# Patient Record
Sex: Male | Born: 1935
Health system: Southern US, Community
[De-identification: ages and names within clinical notes are randomized; demographics above are authoritative.]

## PROBLEM LIST (undated history)

## (undated) DIAGNOSIS — S30861A Insect bite (nonvenomous) of abdominal wall, initial encounter: Secondary | ICD-10-CM

## (undated) DIAGNOSIS — E785 Hyperlipidemia, unspecified: Secondary | ICD-10-CM

## (undated) DIAGNOSIS — E119 Type 2 diabetes mellitus without complications: Secondary | ICD-10-CM

## (undated) DIAGNOSIS — C61 Malignant neoplasm of prostate: Secondary | ICD-10-CM

## (undated) DIAGNOSIS — I779 Disorder of arteries and arterioles, unspecified: Secondary | ICD-10-CM

## (undated) DIAGNOSIS — I499 Cardiac arrhythmia, unspecified: Secondary | ICD-10-CM

## (undated) DIAGNOSIS — R972 Elevated prostate specific antigen [PSA]: Secondary | ICD-10-CM

## (undated) DIAGNOSIS — Z9889 Other specified postprocedural states: Secondary | ICD-10-CM

## (undated) DIAGNOSIS — I2510 Atherosclerotic heart disease of native coronary artery without angina pectoris: Secondary | ICD-10-CM

## (undated) DIAGNOSIS — N529 Male erectile dysfunction, unspecified: Secondary | ICD-10-CM

## (undated) DIAGNOSIS — C801 Malignant (primary) neoplasm, unspecified: Secondary | ICD-10-CM

## (undated) DIAGNOSIS — W57XXXA Bitten or stung by nonvenomous insect and other nonvenomous arthropods, initial encounter: Secondary | ICD-10-CM

## (undated) DIAGNOSIS — B351 Tinea unguium: Secondary | ICD-10-CM

## (undated) DIAGNOSIS — L570 Actinic keratosis: Secondary | ICD-10-CM

## (undated) DIAGNOSIS — I10 Essential (primary) hypertension: Secondary | ICD-10-CM

## (undated) DIAGNOSIS — S4991XA Unspecified injury of right shoulder and upper arm, initial encounter: Secondary | ICD-10-CM

## (undated) HISTORY — DX: Other specified postprocedural states: Z98.890

## (undated) HISTORY — DX: Actinic keratosis: L57.0

## (undated) HISTORY — DX: Cardiac arrhythmia, unspecified: I49.9

## (undated) HISTORY — DX: Atherosclerotic heart disease of native coronary artery without angina pectoris: I25.10

## (undated) HISTORY — DX: Hyperlipidemia, unspecified: E78.5

## (undated) HISTORY — PX: CARDIAC SURGERY: SHX584

## (undated) HISTORY — DX: Insect bite (nonvenomous) of abdominal wall, initial encounter: W57.XXXA

## (undated) HISTORY — DX: Insect bite (nonvenomous) of abdominal wall, initial encounter: S30.861A

## (undated) HISTORY — DX: Tinea unguium: B35.1

## (undated) HISTORY — DX: Type 2 diabetes mellitus without complications: E11.9

## (undated) HISTORY — PX: CATARACT EXTRACTION, BILATERAL: SHX1313

## (undated) HISTORY — DX: Malignant neoplasm of prostate: C61

## (undated) HISTORY — DX: Male erectile dysfunction, unspecified: N52.9

## (undated) HISTORY — DX: Elevated prostate specific antigen (PSA): R97.20

## (undated) HISTORY — DX: Unspecified injury of right shoulder and upper arm, initial encounter: S49.91XA

## (undated) HISTORY — DX: Disorder of arteries and arterioles, unspecified: I77.9

---

## 2002-01-05 HISTORY — PX: CORONARY ARTERY BYPASS GRAFT: SHX141

## 2002-03-29 ENCOUNTER — Encounter: Payer: Self-pay | Admitting: Surgery

## 2002-03-31 ENCOUNTER — Encounter: Payer: Self-pay | Admitting: Surgery

## 2002-03-31 ENCOUNTER — Inpatient Hospital Stay (HOSPITAL_COMMUNITY): Admission: RE | Admit: 2002-03-31 | Discharge: 2002-04-08 | Payer: Self-pay | Admitting: Surgery

## 2002-04-01 ENCOUNTER — Encounter: Payer: Self-pay | Admitting: Surgery

## 2002-04-02 ENCOUNTER — Encounter: Payer: Self-pay | Admitting: Surgery

## 2002-04-25 ENCOUNTER — Encounter: Payer: Self-pay | Admitting: Surgery

## 2002-04-25 ENCOUNTER — Encounter: Admission: RE | Admit: 2002-04-25 | Discharge: 2002-04-25 | Payer: Self-pay | Admitting: Surgery

## 2002-05-02 ENCOUNTER — Encounter: Payer: Self-pay | Admitting: Surgery

## 2002-05-02 ENCOUNTER — Encounter: Admission: RE | Admit: 2002-05-02 | Discharge: 2002-05-02 | Payer: Self-pay | Admitting: Surgery

## 2002-05-08 ENCOUNTER — Encounter (HOSPITAL_COMMUNITY): Admission: RE | Admit: 2002-05-08 | Discharge: 2002-08-06 | Payer: Self-pay | Admitting: Cardiology

## 2002-05-12 ENCOUNTER — Encounter: Admission: RE | Admit: 2002-05-12 | Discharge: 2002-05-12 | Payer: Self-pay | Admitting: Cardiothoracic Surgery

## 2002-05-12 ENCOUNTER — Encounter: Payer: Self-pay | Admitting: Cardiothoracic Surgery

## 2011-01-20 DIAGNOSIS — R209 Unspecified disturbances of skin sensation: Secondary | ICD-10-CM | POA: Diagnosis not present

## 2011-01-20 DIAGNOSIS — I2510 Atherosclerotic heart disease of native coronary artery without angina pectoris: Secondary | ICD-10-CM | POA: Diagnosis not present

## 2011-01-20 DIAGNOSIS — Z79899 Other long term (current) drug therapy: Secondary | ICD-10-CM | POA: Diagnosis not present

## 2011-01-20 DIAGNOSIS — E7800 Pure hypercholesterolemia, unspecified: Secondary | ICD-10-CM | POA: Diagnosis not present

## 2011-01-20 DIAGNOSIS — C61 Malignant neoplasm of prostate: Secondary | ICD-10-CM | POA: Diagnosis not present

## 2011-01-20 DIAGNOSIS — I10 Essential (primary) hypertension: Secondary | ICD-10-CM | POA: Diagnosis not present

## 2011-01-20 DIAGNOSIS — R7309 Other abnormal glucose: Secondary | ICD-10-CM | POA: Diagnosis not present

## 2011-01-20 DIAGNOSIS — Z0000 Encounter for general adult medical examination without abnormal findings: Secondary | ICD-10-CM | POA: Diagnosis not present

## 2011-01-27 DIAGNOSIS — E7800 Pure hypercholesterolemia, unspecified: Secondary | ICD-10-CM | POA: Diagnosis not present

## 2011-01-27 DIAGNOSIS — I2510 Atherosclerotic heart disease of native coronary artery without angina pectoris: Secondary | ICD-10-CM | POA: Diagnosis not present

## 2011-01-27 DIAGNOSIS — I10 Essential (primary) hypertension: Secondary | ICD-10-CM | POA: Diagnosis not present

## 2011-04-02 DIAGNOSIS — C61 Malignant neoplasm of prostate: Secondary | ICD-10-CM | POA: Diagnosis not present

## 2011-04-14 DIAGNOSIS — N401 Enlarged prostate with lower urinary tract symptoms: Secondary | ICD-10-CM | POA: Diagnosis not present

## 2011-04-14 DIAGNOSIS — C61 Malignant neoplasm of prostate: Secondary | ICD-10-CM | POA: Diagnosis not present

## 2011-04-28 DIAGNOSIS — D235 Other benign neoplasm of skin of trunk: Secondary | ICD-10-CM | POA: Diagnosis not present

## 2011-04-28 DIAGNOSIS — L570 Actinic keratosis: Secondary | ICD-10-CM | POA: Diagnosis not present

## 2011-07-10 DIAGNOSIS — L821 Other seborrheic keratosis: Secondary | ICD-10-CM | POA: Diagnosis not present

## 2011-07-10 DIAGNOSIS — D485 Neoplasm of uncertain behavior of skin: Secondary | ICD-10-CM | POA: Diagnosis not present

## 2011-07-30 DIAGNOSIS — W57XXXA Bitten or stung by nonvenomous insect and other nonvenomous arthropods, initial encounter: Secondary | ICD-10-CM | POA: Diagnosis not present

## 2011-07-30 DIAGNOSIS — R209 Unspecified disturbances of skin sensation: Secondary | ICD-10-CM | POA: Diagnosis not present

## 2011-07-30 DIAGNOSIS — Z1331 Encounter for screening for depression: Secondary | ICD-10-CM | POA: Diagnosis not present

## 2011-07-30 DIAGNOSIS — Z79899 Other long term (current) drug therapy: Secondary | ICD-10-CM | POA: Diagnosis not present

## 2011-07-30 DIAGNOSIS — E7800 Pure hypercholesterolemia, unspecified: Secondary | ICD-10-CM | POA: Diagnosis not present

## 2011-07-30 DIAGNOSIS — T148 Other injury of unspecified body region: Secondary | ICD-10-CM | POA: Diagnosis not present

## 2011-07-30 DIAGNOSIS — I2510 Atherosclerotic heart disease of native coronary artery without angina pectoris: Secondary | ICD-10-CM | POA: Diagnosis not present

## 2011-07-30 DIAGNOSIS — I10 Essential (primary) hypertension: Secondary | ICD-10-CM | POA: Diagnosis not present

## 2011-07-30 DIAGNOSIS — Z0000 Encounter for general adult medical examination without abnormal findings: Secondary | ICD-10-CM | POA: Diagnosis not present

## 2011-09-30 DIAGNOSIS — D485 Neoplasm of uncertain behavior of skin: Secondary | ICD-10-CM | POA: Diagnosis not present

## 2011-09-30 DIAGNOSIS — L570 Actinic keratosis: Secondary | ICD-10-CM | POA: Diagnosis not present

## 2011-10-06 DIAGNOSIS — Z1331 Encounter for screening for depression: Secondary | ICD-10-CM | POA: Diagnosis not present

## 2011-10-06 DIAGNOSIS — I10 Essential (primary) hypertension: Secondary | ICD-10-CM | POA: Diagnosis not present

## 2011-10-06 DIAGNOSIS — E7800 Pure hypercholesterolemia, unspecified: Secondary | ICD-10-CM | POA: Diagnosis not present

## 2011-10-06 DIAGNOSIS — I2510 Atherosclerotic heart disease of native coronary artery without angina pectoris: Secondary | ICD-10-CM | POA: Diagnosis not present

## 2011-10-06 DIAGNOSIS — R209 Unspecified disturbances of skin sensation: Secondary | ICD-10-CM | POA: Diagnosis not present

## 2011-10-15 DIAGNOSIS — C61 Malignant neoplasm of prostate: Secondary | ICD-10-CM | POA: Diagnosis not present

## 2011-10-22 DIAGNOSIS — N401 Enlarged prostate with lower urinary tract symptoms: Secondary | ICD-10-CM | POA: Diagnosis not present

## 2011-10-22 DIAGNOSIS — C61 Malignant neoplasm of prostate: Secondary | ICD-10-CM | POA: Diagnosis not present

## 2011-10-27 ENCOUNTER — Other Ambulatory Visit (HOSPITAL_COMMUNITY): Payer: Self-pay | Admitting: Urology

## 2011-10-27 DIAGNOSIS — C61 Malignant neoplasm of prostate: Secondary | ICD-10-CM

## 2011-11-06 ENCOUNTER — Ambulatory Visit (HOSPITAL_COMMUNITY)
Admission: RE | Admit: 2011-11-06 | Discharge: 2011-11-06 | Disposition: A | Discharge status: Home / Self Care | Payer: Medicare Other | Source: Ambulatory Visit | Attending: Urology | Admitting: Urology

## 2011-11-06 ENCOUNTER — Encounter (HOSPITAL_COMMUNITY)
Admission: RE | Admit: 2011-11-06 | Discharge: 2011-11-06 | Disposition: A | Discharge status: Home / Self Care | Payer: Medicare Other | Source: Ambulatory Visit | Attending: Urology | Admitting: Urology

## 2011-11-06 DIAGNOSIS — C61 Malignant neoplasm of prostate: Secondary | ICD-10-CM | POA: Insufficient documentation

## 2011-11-06 MED ORDER — TECHNETIUM TC 99M MEDRONATE IV KIT
24.0000 | PACK | Freq: Once | INTRAVENOUS | Status: AC | PRN
  Administered 2011-11-06: 24 via INTRAVENOUS

## 2011-11-24 DIAGNOSIS — E782 Mixed hyperlipidemia: Secondary | ICD-10-CM | POA: Diagnosis not present

## 2011-12-27 ENCOUNTER — Emergency Department (HOSPITAL_COMMUNITY): Payer: Medicare Other

## 2011-12-27 ENCOUNTER — Encounter (HOSPITAL_COMMUNITY): Payer: Self-pay | Admitting: Emergency Medicine

## 2011-12-27 ENCOUNTER — Emergency Department (HOSPITAL_COMMUNITY)
Admission: EM | Admit: 2011-12-27 | Discharge: 2011-12-27 | Disposition: A | Discharge status: Home / Self Care | Payer: Medicare Other | Attending: Emergency Medicine | Admitting: Emergency Medicine

## 2011-12-27 DIAGNOSIS — I10 Essential (primary) hypertension: Secondary | ICD-10-CM | POA: Insufficient documentation

## 2011-12-27 DIAGNOSIS — R6889 Other general symptoms and signs: Secondary | ICD-10-CM | POA: Insufficient documentation

## 2011-12-27 DIAGNOSIS — R490 Dysphonia: Secondary | ICD-10-CM | POA: Diagnosis not present

## 2011-12-27 DIAGNOSIS — J069 Acute upper respiratory infection, unspecified: Secondary | ICD-10-CM | POA: Diagnosis not present

## 2011-12-27 DIAGNOSIS — R05 Cough: Secondary | ICD-10-CM | POA: Insufficient documentation

## 2011-12-27 DIAGNOSIS — Z8546 Personal history of malignant neoplasm of prostate: Secondary | ICD-10-CM | POA: Diagnosis not present

## 2011-12-27 DIAGNOSIS — R059 Cough, unspecified: Secondary | ICD-10-CM | POA: Insufficient documentation

## 2011-12-27 DIAGNOSIS — B9789 Other viral agents as the cause of diseases classified elsewhere: Secondary | ICD-10-CM

## 2011-12-27 DIAGNOSIS — J989 Respiratory disorder, unspecified: Secondary | ICD-10-CM | POA: Diagnosis not present

## 2011-12-27 HISTORY — DX: Essential (primary) hypertension: I10

## 2011-12-27 HISTORY — DX: Malignant (primary) neoplasm, unspecified: C80.1

## 2011-12-27 NOTE — ED Notes (Signed)
Pt from home c/o toast stuck in throat. Pt reports sore throat and non-productive cough since Friday. Pt denies SOB or CP, fever, N/V. Pt in NAD and A&Ox4

## 2011-12-27 NOTE — ED Provider Notes (Signed)
History     CSN: [id number]  Arrival date & time 12/27/11  4098   First MD Initiated Contact with Patient 12/27/11 1016      Chief Complaint  Patient presents with  . Sore Throat  . food bolus     (Consider location/radiation/quality/duration/timing/severity/associated sxs/prior treatment) HPI Patient complains of sore throat with pain on swallowing and hoarseness for 2 days symptoms accompanied by cough, no fever. Presents today as this morning he was eating a piece of toast and feels that pus is stuck in his throat. He's been able to drink since the event. No treatment prior to coming here no other complaint no other associated symptoms  Past Medical History  Diagnosis Date  . Cancer     prostate   . Hypertension     Past Surgical History  Procedure Date  . Coronary artery bypass graft 2004  . Cardiac surgery     No family history on file.  History  Substance Use Topics  . Smoking status: Never Smoker   . Smokeless tobacco: Not on file  . Alcohol Use: No      Review of Systems  HENT: Positive for sore throat and voice change.   Respiratory: Positive for cough.   All other systems reviewed and are negative.    Allergies  Review of patient's allergies indicates no known allergies.  Home Medications  No current outpatient prescriptions on file.  BP 131/72  Pulse 95  Temp 99.2 F (37.3 C) (Oral)  Resp 20  SpO2 97%  Physical Exam  Nursing note and vitals reviewed. Constitutional: He appears well-developed and well-nourished.  HENT:  Head: Normocephalic and atraumatic.  Right Ear: External ear normal.  Left Ear: External ear normal.  Mouth/Throat: Oropharynx is clear and moist. No oropharyngeal exudate.  Eyes: Conjunctivae normal are normal. Pupils are equal, round, and reactive to light.  Neck: Neck supple. No tracheal deviation present. No thyromegaly present.  Cardiovascular: Normal rate and regular rhythm.   No murmur heard. Pulmonary/Chest:  Effort normal and breath sounds normal.  Abdominal: Soft. Bowel sounds are normal. He exhibits no distension. There is no tenderness.  Musculoskeletal: Normal range of motion. He exhibits no edema and no tenderness.  Lymphadenopathy:    He has no cervical adenopathy.  Neurological: He is alert. Coordination normal.  Skin: Skin is warm and dry. No rash noted.  Psychiatric: He has a normal mood and affect.    ED Course  Procedures (including critical care time)  Labs Reviewed - No data to display No results found.   No diagnosis found.  At 12 noon patient no longer feels foreign body in his throat. He is able to drink several ounces of water without difficulty.  No results found for this or any previous visit. Dg Chest 2 View  12/27/2011  *RADIOLOGY REPORT*  Clinical Data: Cough  CHEST - 2 VIEW  Comparison: None.  Findings: Cardiomediastinal silhouette is unremarkable.  Status post CABG.  Mild degenerative changes thoracic spine.  No acute infiltrate or pleural effusion.  No pulmonary edema.  IMPRESSION: No active disease.  Status post CABG.   Original Report Authenticated By: Natasha Mead, M.D.     Chest x-ray reviewed by me  MDM  Symptoms consistent with viral respiratory illness. Foreign body sensation in throat resolved spontaneously. Spoke with Dr. Dulce Sellar. Plan patient coughs tomorrow to check in . If he has recurrent foreign body sensation in his throat, will receive further diagnostic outpatient evaluation.  Patient is to  take or Advil for aches. See Dr. Jerelyn Scott if cough and sore throat continues in the week Diagnosis #1 viral respiratory illness #2 dysphagia        Doug Sou, MD 12/27/11 1209

## 2012-01-04 DIAGNOSIS — J069 Acute upper respiratory infection, unspecified: Secondary | ICD-10-CM | POA: Diagnosis not present

## 2012-01-27 DIAGNOSIS — E782 Mixed hyperlipidemia: Secondary | ICD-10-CM | POA: Diagnosis not present

## 2012-01-27 DIAGNOSIS — I2510 Atherosclerotic heart disease of native coronary artery without angina pectoris: Secondary | ICD-10-CM | POA: Diagnosis not present

## 2012-01-27 DIAGNOSIS — E119 Type 2 diabetes mellitus without complications: Secondary | ICD-10-CM | POA: Diagnosis not present

## 2012-01-27 DIAGNOSIS — M25569 Pain in unspecified knee: Secondary | ICD-10-CM | POA: Diagnosis not present

## 2012-01-27 DIAGNOSIS — Z79899 Other long term (current) drug therapy: Secondary | ICD-10-CM | POA: Diagnosis not present

## 2012-01-27 DIAGNOSIS — I10 Essential (primary) hypertension: Secondary | ICD-10-CM | POA: Diagnosis not present

## 2012-01-29 DIAGNOSIS — I2510 Atherosclerotic heart disease of native coronary artery without angina pectoris: Secondary | ICD-10-CM | POA: Diagnosis not present

## 2012-01-29 DIAGNOSIS — R0989 Other specified symptoms and signs involving the circulatory and respiratory systems: Secondary | ICD-10-CM | POA: Diagnosis not present

## 2012-01-29 DIAGNOSIS — E7800 Pure hypercholesterolemia, unspecified: Secondary | ICD-10-CM | POA: Diagnosis not present

## 2012-01-29 DIAGNOSIS — I10 Essential (primary) hypertension: Secondary | ICD-10-CM | POA: Diagnosis not present

## 2012-03-24 DIAGNOSIS — Z961 Presence of intraocular lens: Secondary | ICD-10-CM | POA: Diagnosis not present

## 2012-03-30 DIAGNOSIS — D235 Other benign neoplasm of skin of trunk: Secondary | ICD-10-CM | POA: Diagnosis not present

## 2012-03-30 DIAGNOSIS — Z85828 Personal history of other malignant neoplasm of skin: Secondary | ICD-10-CM | POA: Diagnosis not present

## 2012-03-30 DIAGNOSIS — L570 Actinic keratosis: Secondary | ICD-10-CM | POA: Diagnosis not present

## 2012-04-15 DIAGNOSIS — R0989 Other specified symptoms and signs involving the circulatory and respiratory systems: Secondary | ICD-10-CM | POA: Diagnosis not present

## 2012-05-10 DIAGNOSIS — C61 Malignant neoplasm of prostate: Secondary | ICD-10-CM | POA: Diagnosis not present

## 2012-05-13 DIAGNOSIS — N401 Enlarged prostate with lower urinary tract symptoms: Secondary | ICD-10-CM | POA: Diagnosis not present

## 2012-05-13 DIAGNOSIS — C61 Malignant neoplasm of prostate: Secondary | ICD-10-CM | POA: Diagnosis not present

## 2012-08-08 DIAGNOSIS — M25519 Pain in unspecified shoulder: Secondary | ICD-10-CM | POA: Diagnosis not present

## 2012-08-08 DIAGNOSIS — S40019A Contusion of unspecified shoulder, initial encounter: Secondary | ICD-10-CM | POA: Diagnosis not present

## 2012-08-24 DIAGNOSIS — S40019A Contusion of unspecified shoulder, initial encounter: Secondary | ICD-10-CM | POA: Diagnosis not present

## 2012-08-24 DIAGNOSIS — M25519 Pain in unspecified shoulder: Secondary | ICD-10-CM | POA: Diagnosis not present

## 2012-09-16 DIAGNOSIS — R5382 Chronic fatigue, unspecified: Secondary | ICD-10-CM | POA: Diagnosis not present

## 2012-09-16 DIAGNOSIS — Z1331 Encounter for screening for depression: Secondary | ICD-10-CM | POA: Diagnosis not present

## 2012-09-16 DIAGNOSIS — I2510 Atherosclerotic heart disease of native coronary artery without angina pectoris: Secondary | ICD-10-CM | POA: Diagnosis not present

## 2012-09-16 DIAGNOSIS — Z0000 Encounter for general adult medical examination without abnormal findings: Secondary | ICD-10-CM | POA: Diagnosis not present

## 2012-09-16 DIAGNOSIS — M79609 Pain in unspecified limb: Secondary | ICD-10-CM | POA: Diagnosis not present

## 2012-09-16 DIAGNOSIS — I10 Essential (primary) hypertension: Secondary | ICD-10-CM | POA: Diagnosis not present

## 2012-09-16 DIAGNOSIS — H9190 Unspecified hearing loss, unspecified ear: Secondary | ICD-10-CM | POA: Diagnosis not present

## 2012-09-16 DIAGNOSIS — K5900 Constipation, unspecified: Secondary | ICD-10-CM | POA: Diagnosis not present

## 2012-09-16 DIAGNOSIS — E119 Type 2 diabetes mellitus without complications: Secondary | ICD-10-CM | POA: Diagnosis not present

## 2012-09-16 DIAGNOSIS — E7800 Pure hypercholesterolemia, unspecified: Secondary | ICD-10-CM | POA: Diagnosis not present

## 2012-09-16 DIAGNOSIS — E1142 Type 2 diabetes mellitus with diabetic polyneuropathy: Secondary | ICD-10-CM | POA: Diagnosis not present

## 2012-09-22 DIAGNOSIS — E7800 Pure hypercholesterolemia, unspecified: Secondary | ICD-10-CM | POA: Diagnosis not present

## 2012-09-22 DIAGNOSIS — R5381 Other malaise: Secondary | ICD-10-CM | POA: Diagnosis not present

## 2012-09-22 DIAGNOSIS — I2510 Atherosclerotic heart disease of native coronary artery without angina pectoris: Secondary | ICD-10-CM | POA: Diagnosis not present

## 2012-09-22 DIAGNOSIS — I10 Essential (primary) hypertension: Secondary | ICD-10-CM | POA: Diagnosis not present

## 2012-09-29 DIAGNOSIS — I10 Essential (primary) hypertension: Secondary | ICD-10-CM | POA: Diagnosis not present

## 2012-09-29 DIAGNOSIS — E7800 Pure hypercholesterolemia, unspecified: Secondary | ICD-10-CM | POA: Diagnosis not present

## 2012-09-29 DIAGNOSIS — I2510 Atherosclerotic heart disease of native coronary artery without angina pectoris: Secondary | ICD-10-CM | POA: Diagnosis not present

## 2012-09-29 DIAGNOSIS — R5381 Other malaise: Secondary | ICD-10-CM | POA: Diagnosis not present

## 2012-10-14 DIAGNOSIS — Z23 Encounter for immunization: Secondary | ICD-10-CM | POA: Diagnosis not present

## 2012-10-31 DIAGNOSIS — E7800 Pure hypercholesterolemia, unspecified: Secondary | ICD-10-CM | POA: Diagnosis not present

## 2012-10-31 DIAGNOSIS — M67919 Unspecified disorder of synovium and tendon, unspecified shoulder: Secondary | ICD-10-CM | POA: Diagnosis not present

## 2012-10-31 DIAGNOSIS — K5900 Constipation, unspecified: Secondary | ICD-10-CM | POA: Diagnosis not present

## 2012-10-31 DIAGNOSIS — R5381 Other malaise: Secondary | ICD-10-CM | POA: Diagnosis not present

## 2012-10-31 DIAGNOSIS — I2510 Atherosclerotic heart disease of native coronary artery without angina pectoris: Secondary | ICD-10-CM | POA: Diagnosis not present

## 2012-10-31 DIAGNOSIS — I10 Essential (primary) hypertension: Secondary | ICD-10-CM | POA: Diagnosis not present

## 2012-11-14 ENCOUNTER — Encounter: Payer: Self-pay | Admitting: Interventional Cardiology

## 2012-11-16 DIAGNOSIS — L570 Actinic keratosis: Secondary | ICD-10-CM | POA: Diagnosis not present

## 2012-11-16 DIAGNOSIS — D239 Other benign neoplasm of skin, unspecified: Secondary | ICD-10-CM | POA: Diagnosis not present

## 2012-12-09 DIAGNOSIS — C61 Malignant neoplasm of prostate: Secondary | ICD-10-CM | POA: Diagnosis not present

## 2012-12-12 DIAGNOSIS — N949 Unspecified condition associated with female genital organs and menstrual cycle: Secondary | ICD-10-CM | POA: Diagnosis not present

## 2012-12-16 DIAGNOSIS — C61 Malignant neoplasm of prostate: Secondary | ICD-10-CM | POA: Diagnosis not present

## 2012-12-23 DIAGNOSIS — I10 Essential (primary) hypertension: Secondary | ICD-10-CM | POA: Diagnosis not present

## 2012-12-23 DIAGNOSIS — R5381 Other malaise: Secondary | ICD-10-CM | POA: Diagnosis not present

## 2012-12-23 DIAGNOSIS — I2510 Atherosclerotic heart disease of native coronary artery without angina pectoris: Secondary | ICD-10-CM | POA: Diagnosis not present

## 2012-12-23 DIAGNOSIS — M67919 Unspecified disorder of synovium and tendon, unspecified shoulder: Secondary | ICD-10-CM | POA: Diagnosis not present

## 2012-12-23 DIAGNOSIS — K5900 Constipation, unspecified: Secondary | ICD-10-CM | POA: Diagnosis not present

## 2012-12-23 DIAGNOSIS — E7800 Pure hypercholesterolemia, unspecified: Secondary | ICD-10-CM | POA: Diagnosis not present

## 2013-01-16 DIAGNOSIS — R195 Other fecal abnormalities: Secondary | ICD-10-CM | POA: Diagnosis not present

## 2013-01-16 DIAGNOSIS — J40 Bronchitis, not specified as acute or chronic: Secondary | ICD-10-CM | POA: Diagnosis not present

## 2013-01-23 ENCOUNTER — Ambulatory Visit
Admission: RE | Admit: 2013-01-23 | Discharge: 2013-01-23 | Disposition: A | Discharge status: Home / Self Care | Payer: Medicare Other | Source: Ambulatory Visit | Attending: Internal Medicine | Admitting: Internal Medicine

## 2013-01-23 ENCOUNTER — Other Ambulatory Visit: Payer: Self-pay | Admitting: Internal Medicine

## 2013-01-23 DIAGNOSIS — R062 Wheezing: Secondary | ICD-10-CM | POA: Diagnosis not present

## 2013-01-23 DIAGNOSIS — R05 Cough: Secondary | ICD-10-CM | POA: Diagnosis not present

## 2013-01-23 DIAGNOSIS — R195 Other fecal abnormalities: Secondary | ICD-10-CM | POA: Diagnosis not present

## 2013-01-23 DIAGNOSIS — R059 Cough, unspecified: Secondary | ICD-10-CM

## 2013-01-23 DIAGNOSIS — J9801 Acute bronchospasm: Secondary | ICD-10-CM | POA: Diagnosis not present

## 2013-01-23 DIAGNOSIS — J40 Bronchitis, not specified as acute or chronic: Secondary | ICD-10-CM | POA: Diagnosis not present

## 2013-01-24 DIAGNOSIS — M25519 Pain in unspecified shoulder: Secondary | ICD-10-CM | POA: Diagnosis not present

## 2013-01-24 DIAGNOSIS — S40019A Contusion of unspecified shoulder, initial encounter: Secondary | ICD-10-CM | POA: Diagnosis not present

## 2013-01-26 DIAGNOSIS — M25519 Pain in unspecified shoulder: Secondary | ICD-10-CM | POA: Diagnosis not present

## 2013-02-01 ENCOUNTER — Ambulatory Visit: Payer: Medicare Other | Admitting: Interventional Cardiology

## 2013-02-01 DIAGNOSIS — M25519 Pain in unspecified shoulder: Secondary | ICD-10-CM | POA: Diagnosis not present

## 2013-02-15 ENCOUNTER — Ambulatory Visit (INDEPENDENT_AMBULATORY_CARE_PROVIDER_SITE_OTHER): Payer: Medicare Other | Admitting: Interventional Cardiology

## 2013-02-15 ENCOUNTER — Encounter: Payer: Self-pay | Admitting: Cardiology

## 2013-02-15 ENCOUNTER — Encounter: Payer: Self-pay | Admitting: Interventional Cardiology

## 2013-02-15 VITALS — BP 116/68 | HR 93 | Wt 185.0 lb

## 2013-02-15 DIAGNOSIS — R5381 Other malaise: Secondary | ICD-10-CM

## 2013-02-15 DIAGNOSIS — R5383 Other fatigue: Secondary | ICD-10-CM | POA: Insufficient documentation

## 2013-02-15 DIAGNOSIS — I2510 Atherosclerotic heart disease of native coronary artery without angina pectoris: Secondary | ICD-10-CM

## 2013-02-15 DIAGNOSIS — I10 Essential (primary) hypertension: Secondary | ICD-10-CM

## 2013-02-15 DIAGNOSIS — E7800 Pure hypercholesterolemia, unspecified: Secondary | ICD-10-CM | POA: Diagnosis not present

## 2013-02-15 NOTE — Progress Notes (Signed)
Patient ID: Johnny Fox, male   DOB: 02-16-1928, 80 y.o.   MRN: [id number]    [address], [place], [place]  [address] Phone: [phone] Fax:  [phone]  Date:  02/15/2013   ID:  Johnny Fox, DOB 1928-03-10, MRN [id number]  PCP:  No primary provider on file.      History of Present Illness: Johnny Fox is a 80 y.o. male who has had CABG in 2004. HE carries NTG but he has never used it. Lipids were well controlled. He used to play golf and walk the course. Now he rides, but walks more in the neighborhood. He walks in the park and does yardwork. Prior to CABG, he was not having many symptoms. He had a routine ECG and stress test showing CAD. He had only some DOE. Walking 4 days / week, when the weather allows. He goes to a fitness center on [address]. Double vision at times. Leg pain, R> L, more in the calf. It only hurts while lying in bed. Not associated with walking. He has had some numbness in his feet. CAD/ASCVD:  c/o Dizziness lasting for seconds, unchanged, while getting up from sitting position.  Denies : Dyspnea on exertion.  Fatigue.  Leg edema.  Nitroglycerin.  Orthopnea.  Palpitations.  Paroxysmal nocturnal dyspnea.  Syncope.     Wt Readings from Last 3 Encounters:  02/15/13 185 lb (83.915 kg)     Past Medical History  Diagnosis Date  . Cancer     prostate   . Hypertension   . Coronary artery disease   . Dyslipidemia   . Diabetes mellitus without complication     diet controlled  . Actinic keratoses     and seborrheic keratoses  . ED (erectile dysfunction)   . Onychomycosis   . Arrhythmia     afib postoperatively after CABG in 2004, No recurrence  . Elevated PSA     2010, workup with Johnny Fox  . H/O prostate biopsy   . Prostate cancer     PSA's run approx 7- 2011-2013  . Right shoulder injury     july 2014, Johnny Fox  . Tick bite of abdomen     2013, treated with 3wks of doxycycline, tick engorged 3.60mm  nodule left thyroid 4/14- seen on carotid u/s    Current Outpatient Prescriptions  Medication Sig Dispense Refill  . aspirin 325 MG tablet Take 325 mg by mouth daily.      . cholecalciferol (VITAMIN D) 1000 UNITS tablet Take 1,000 Units by mouth daily.      . Coenzyme Q10 (COQ-10) 100 MG CAPS Take 1 capsule by mouth daily.      . nitroGLYCERIN (NITROSTAT) 0.4 MG SL tablet Place 0.4 mg under the tongue every 5 (five) minutes as needed for chest pain.      Marland Kitchen omega-3 acid ethyl esters (LOVAZA) 1 G capsule Take 1 g by mouth 3 (three) times daily.      Marland Kitchen OVER THE COUNTER MEDICATION Take 5 mLs by mouth every 6 (six) hours as needed. OTC Cough syrup,      . rosuvastatin (CRESTOR) 5 MG tablet Take 5 mg by mouth. 1/2 tablet daily.      . valsartan (DIOVAN) 160 MG tablet Take 80 mg by mouth daily.       No current facility-administered medications for this visit.    Allergies:    Allergies  Allergen Reactions  . Doxycycline Rash  Photosensitive rash over arms after 3 weeks of therapy    Social History:  The patient  reports that he has never smoked. He does not have any smokeless tobacco history on file. He reports that he does not drink alcohol or use illicit drugs.   Family History:  The patient's family history includes CAD in his father; CVA in his father; Hypertension in his father.   ROS:  Please see the history of present illness.  No nausea, vomiting.  No fevers, chills.  No focal weakness.  No dysuria.   All other systems reviewed and negative.   PHYSICAL EXAM: VS:  BP 116/68  Pulse 93  Wt 185 lb (83.915 kg) Well nourished, well developed, in no acute distress HEENT: normal Neck: no JVD, no carotid bruits Cardiac:  normal S1, S2; RRR;  Lungs:  clear to auscultation bilaterally, no wheezing, rhonchi or rales Abd: soft, nontender, no hepatomegaly Ext: no edema Skin: warm and dry Neuro:   no focal abnormalities noted  EKG:  Normal     Carotid Doppler done in April 2014  showing mild bilateral carotid plaque without significant stenosis. Of note, there is a 3.8 mm left thyroid nodule.  ASSESSMENT AND PLAN:  Coronary atherosclerosis of native coronary artery  Continue Aspirin Tablet, 325 MG, 1 tablet, Orally, Once a day Continue Nitroglycerin Tablet Sublingual, 0.4 MG, 1 tablet under the tongue, Sublingual, P.r.n. No angina. He had a stress test in September 2014 showing no ischemia. At that time he had a decrease in exercise tolerance.  2. Hypertension, essential  Continue Diovan Tablet, 160 MG, 1/2 tab, Orally, Once a day COntrolled. Check BP at home. If above 140/90 consistently, let us know.  3. Pure hypercholesterolemia  Continue Co Q-10 Capsule, 100 MG, as directed, Orally, Once a day Continue Fish Oil Capsule, 1000 MG, as directed, Orally, Three times daily Continue Niaspan Tablet Extended Release, 1000 mg, 1/2 tab, Orally, Once a day Lipids well controlled. LDL 46 in January 2014. LDL 66 in December 2014. HDL 43  4.  thyroid nodule: Noted on prior carotid Doppler. We'll refer back to Johnny Fox. 3.8 mm in the left thyroid.     Signed, Mina Marble, MD, [facility] 02/15/2013 9:22 AM

## 2013-02-15 NOTE — Patient Instructions (Signed)
Your physician recommends that you continue on your current medications as directed. Please refer to the Current Medication list given to you today.  Your physician wants you to follow-up in: 1 year with Dr. Varanasi. You will receive a reminder letter in the mail two months in advance. If you don't receive a letter, please call our office to schedule the follow-up appointment.  

## 2013-04-10 DIAGNOSIS — J329 Chronic sinusitis, unspecified: Secondary | ICD-10-CM | POA: Diagnosis not present

## 2013-04-10 DIAGNOSIS — H6120 Impacted cerumen, unspecified ear: Secondary | ICD-10-CM | POA: Diagnosis not present

## 2013-04-17 DIAGNOSIS — J069 Acute upper respiratory infection, unspecified: Secondary | ICD-10-CM | POA: Diagnosis not present

## 2013-04-19 DIAGNOSIS — L819 Disorder of pigmentation, unspecified: Secondary | ICD-10-CM | POA: Diagnosis not present

## 2013-04-19 DIAGNOSIS — L738 Other specified follicular disorders: Secondary | ICD-10-CM | POA: Diagnosis not present

## 2013-04-19 DIAGNOSIS — L570 Actinic keratosis: Secondary | ICD-10-CM | POA: Diagnosis not present

## 2013-04-19 DIAGNOSIS — L821 Other seborrheic keratosis: Secondary | ICD-10-CM | POA: Diagnosis not present

## 2013-04-19 DIAGNOSIS — L678 Other hair color and hair shaft abnormalities: Secondary | ICD-10-CM | POA: Diagnosis not present

## 2013-05-17 DIAGNOSIS — L578 Other skin changes due to chronic exposure to nonionizing radiation: Secondary | ICD-10-CM | POA: Diagnosis not present

## 2013-06-15 DIAGNOSIS — C61 Malignant neoplasm of prostate: Secondary | ICD-10-CM | POA: Diagnosis not present

## 2013-07-06 DIAGNOSIS — C61 Malignant neoplasm of prostate: Secondary | ICD-10-CM | POA: Diagnosis not present

## 2013-07-06 DIAGNOSIS — N138 Other obstructive and reflux uropathy: Secondary | ICD-10-CM | POA: Diagnosis not present

## 2013-07-06 DIAGNOSIS — N401 Enlarged prostate with lower urinary tract symptoms: Secondary | ICD-10-CM | POA: Diagnosis not present

## 2013-10-17 DIAGNOSIS — M545 Low back pain: Secondary | ICD-10-CM | POA: Diagnosis not present

## 2013-10-17 DIAGNOSIS — M5137 Other intervertebral disc degeneration, lumbosacral region: Secondary | ICD-10-CM | POA: Diagnosis not present

## 2013-10-26 ENCOUNTER — Other Ambulatory Visit: Payer: Self-pay | Admitting: Internal Medicine

## 2013-10-26 DIAGNOSIS — I480 Paroxysmal atrial fibrillation: Secondary | ICD-10-CM | POA: Diagnosis not present

## 2013-10-26 DIAGNOSIS — Z0000 Encounter for general adult medical examination without abnormal findings: Secondary | ICD-10-CM | POA: Diagnosis not present

## 2013-10-26 DIAGNOSIS — E119 Type 2 diabetes mellitus without complications: Secondary | ICD-10-CM | POA: Diagnosis not present

## 2013-10-26 DIAGNOSIS — E780 Pure hypercholesterolemia: Secondary | ICD-10-CM | POA: Diagnosis not present

## 2013-10-26 DIAGNOSIS — E559 Vitamin D deficiency, unspecified: Secondary | ICD-10-CM | POA: Diagnosis not present

## 2013-10-26 DIAGNOSIS — Z79899 Other long term (current) drug therapy: Secondary | ICD-10-CM | POA: Diagnosis not present

## 2013-10-26 DIAGNOSIS — Z1389 Encounter for screening for other disorder: Secondary | ICD-10-CM | POA: Diagnosis not present

## 2013-10-26 DIAGNOSIS — R972 Elevated prostate specific antigen [PSA]: Secondary | ICD-10-CM | POA: Diagnosis not present

## 2013-10-26 DIAGNOSIS — Z951 Presence of aortocoronary bypass graft: Secondary | ICD-10-CM

## 2013-10-26 DIAGNOSIS — Z23 Encounter for immunization: Secondary | ICD-10-CM | POA: Diagnosis not present

## 2013-10-26 DIAGNOSIS — I10 Essential (primary) hypertension: Secondary | ICD-10-CM | POA: Diagnosis not present

## 2013-11-06 ENCOUNTER — Ambulatory Visit
Admission: RE | Admit: 2013-11-06 | Discharge: 2013-11-06 | Disposition: A | Discharge status: Home / Self Care | Payer: Medicare Other | Source: Ambulatory Visit | Attending: Internal Medicine | Admitting: Internal Medicine

## 2013-11-06 DIAGNOSIS — Z951 Presence of aortocoronary bypass graft: Secondary | ICD-10-CM

## 2013-11-06 DIAGNOSIS — I714 Abdominal aortic aneurysm, without rupture: Secondary | ICD-10-CM | POA: Diagnosis not present

## 2013-11-23 DIAGNOSIS — L814 Other melanin hyperpigmentation: Secondary | ICD-10-CM | POA: Diagnosis not present

## 2013-11-23 DIAGNOSIS — D225 Melanocytic nevi of trunk: Secondary | ICD-10-CM | POA: Diagnosis not present

## 2013-11-23 DIAGNOSIS — L570 Actinic keratosis: Secondary | ICD-10-CM | POA: Diagnosis not present

## 2013-11-23 DIAGNOSIS — L821 Other seborrheic keratosis: Secondary | ICD-10-CM | POA: Diagnosis not present

## 2013-12-06 ENCOUNTER — Ambulatory Visit (INDEPENDENT_AMBULATORY_CARE_PROVIDER_SITE_OTHER): Payer: Medicare Other | Admitting: Interventional Cardiology

## 2013-12-06 ENCOUNTER — Encounter: Payer: Self-pay | Admitting: Interventional Cardiology

## 2013-12-06 VITALS — BP 140/74 | HR 73 | Ht 70.5 in | Wt 191.8 lb

## 2013-12-06 DIAGNOSIS — E7800 Pure hypercholesterolemia, unspecified: Secondary | ICD-10-CM

## 2013-12-06 DIAGNOSIS — I10 Essential (primary) hypertension: Secondary | ICD-10-CM

## 2013-12-06 DIAGNOSIS — I2510 Atherosclerotic heart disease of native coronary artery without angina pectoris: Secondary | ICD-10-CM | POA: Diagnosis not present

## 2013-12-06 NOTE — Progress Notes (Signed)
Patient ID: Johnny Fox, male   DOB: May 28, 1928, 80 y.o.   MRN: [id number] Patient ID: Johnny Fox, male   DOB: [birth year], 80 y.o.   MRN: [id number]    [place] [address], [place], [place]  [address] Phone: [phone] Fax:  [phone]  Date:  12/07/2013   ID:  Johnny Fox, DOB [birth year], MRN [id number]  PCP:  Henrine Screws, MD      History of Present Illness: Johnny Fox is a 80 y.o. male who has had CABG in 2004. HE carries NTG.  A few months ago, he had  A slight pain and used a NTG.  It was not severe pain but he thought he would try the NTG.  Not a similar sensation prior to CABG.   Lipids were well controlled. He used to play golf and walk the course. Now he rides, but walks more in the neighborhood. He walks in the park and does yardwork.  He has more fatigue now and plays less.  Limited also by spinal stenosis.   Prior to CABG, he was not having many symptoms. He had a routine ECG and stress test showing CAD. He had only some DOE. No longer Walking 4 days / week, maybe 1-2x/week when the weather allows. He no longer goes to a fitness center on [address]. Improved Double vision, but still present at times. Leg pain, R> L, more in the calf. It only hurts while lying in bed. Not associated with walking. He has had some numbness in his feet. Numbness better with walking. CAD/ASCVD:   Denies : Dyspnea on exertion.  Fatigue.  Leg edema.  Orthopnea.  Palpitations.  Paroxysmal nocturnal dyspnea.  Syncope.     Wt Readings from Last 3 Encounters:  12/06/13 191 lb 12.8 oz (87 kg)  02/15/13 185 lb (83.915 kg)     Past Medical History  Diagnosis Date  . Cancer     prostate   . Hypertension   . Coronary artery disease   . Dyslipidemia   . Diabetes mellitus without complication     diet controlled  . Actinic keratoses     and seborrheic keratoses  . ED (erectile dysfunction)   . Onychomycosis   . Arrhythmia     afib  postoperatively after CABG in 2004, No recurrence  . Elevated PSA     2010, workup with Dr. Risa Grill  . H/O prostate biopsy   . Prostate cancer     PSA's run approx 7- 2011-2013  . Right shoulder injury     july 2014, Dr. Justice Britain  . Tick bite of abdomen     2013, treated with 3wks of doxycycline, tick engorged 3.101mm nodule left thyroid 4/14- seen on carotid u/s    Current Outpatient Prescriptions  Medication Sig Dispense Refill  . aspirin 325 MG tablet Take 325 mg by mouth daily.    . cholecalciferol (VITAMIN D) 1000 UNITS tablet Take 1,000 Units by mouth daily.    . Coenzyme Q10 (COQ-10) 100 MG CAPS Take 1 capsule by mouth daily.    . nitroGLYCERIN (NITROSTAT) 0.4 MG SL tablet Place 0.4 mg under the tongue every 5 (five) minutes as needed for chest pain.    Marland Kitchen omega-3 acid ethyl esters (LOVAZA) 1 G capsule Take 1 g by mouth 3 (three) times daily.    Marland Kitchen OVER THE COUNTER MEDICATION Take 5 mLs by mouth every 6 (six) hours as needed. OTC Cough syrup,    .  rosuvastatin (CRESTOR) 5 MG tablet Take 5 mg by mouth. 1/2 tablet daily.    . valsartan (DIOVAN) 160 MG tablet Take 80 mg by mouth daily.     No current facility-administered medications for this visit.    Allergies:    Allergies  Allergen Reactions  . Doxycycline Rash    Photosensitive rash over arms after 3 weeks of therapy    Social History:  The patient  reports that he has never smoked. He does not have any smokeless tobacco history on file. He reports that he does not drink alcohol or use illicit drugs.   Family History:  The patient's family history includes CAD in his father; CVA in his father; Hypertension in his father.   ROS:  Please see the history of present illness.  No nausea, vomiting.  No fevers, chills.  No focal weakness.  No dysuria.   All other systems reviewed and negative.   PHYSICAL EXAM: VS:  BP 140/74 mmHg  Pulse 73  Ht 5' 10.5" (1.791 m)  Wt 191 lb 12.8 oz (87 kg)  BMI 27.12 kg/m2  SpO2 98% Well  nourished, well developed, in no acute distress HEENT: normal Neck: no JVD, no carotid bruits Cardiac:  normal S1, S2; RRR;  Lungs:  clear to auscultation bilaterally, no wheezing, rhonchi or rales Abd: soft, nontender, no hepatomegaly Ext: no edema, 1+ right PT pulse, tr left PT pulse Skin: warm and dry Neuro:   no focal abnormalities noted Psych: normal affect  EKG:  Normal     Carotid Doppler done in April 2014 showing mild bilateral carotid plaque without significant stenosis. Of note, there is a 3.8 mm left thyroid nodule.  ASSESSMENT AND PLAN:  Coronary atherosclerosis of native coronary artery  Continue Aspirin Tablet, 325 MG, 1 tablet, Orally, Once a day Continue Nitroglycerin Tablet Sublingual, 0.4 MG, 1 tablet under the tongue, Sublingual, P.r.n. No angina. He had a stress test in September 2014 showing no ischemia. At that time he had a decrease in exercise tolerance.  He will try to increase exercise to improve stamina.  2. Hypertension, essential  Continue Diovan Tablet, 160 MG, 1/2 tab, Orally, Once a day COntrolled. Check BP at home. If above 140/90 consistently, let us know.  3. Pure hypercholesterolemia  Continue Co Q-10 Capsule, 100 MG, as directed, Orally, Once a day Continue Fish Oil Capsule, 1000 MG, as directed, Orally, Three times daily Off of Niaspan Tablet Extended Release, 1000 mg, 1/2 tab, Orally, Once a day Lipids well controlled. LDL 46 in January 2014. LDL 66 in December 2014. HDL 43 Will try to obtain blood test result from Dr. Inda Merlin.  4.  thyroid nodule: Noted on prior carotid Doppler. We'll refer back to Dr. Inda Merlin. 3.8 mm in the left thyroid.  5. Aortic atherosclerosis: Aotra up to 2.9 cm in size in 11/15.   Signed, Mina Marble, MD, [facility] 12/07/2013 11:14 AM

## 2013-12-06 NOTE — Patient Instructions (Signed)
Your physician recommends that you continue on your current medications as directed. Please refer to the Current Medication list given to you today.  Your physician wants you to follow-up in: One year with Dr. Irish Lack. You will receive a reminder letter in the mail two months in advance. If you don't receive a letter, please call our office to schedule the follow-up appointment.

## 2013-12-08 DIAGNOSIS — H3531 Nonexudative age-related macular degeneration: Secondary | ICD-10-CM | POA: Diagnosis not present

## 2013-12-08 DIAGNOSIS — H5203 Hypermetropia, bilateral: Secondary | ICD-10-CM | POA: Diagnosis not present

## 2013-12-08 DIAGNOSIS — Z961 Presence of intraocular lens: Secondary | ICD-10-CM | POA: Diagnosis not present

## 2013-12-08 DIAGNOSIS — H524 Presbyopia: Secondary | ICD-10-CM | POA: Diagnosis not present

## 2013-12-08 DIAGNOSIS — H52223 Regular astigmatism, bilateral: Secondary | ICD-10-CM | POA: Diagnosis not present

## 2013-12-08 DIAGNOSIS — H43393 Other vitreous opacities, bilateral: Secondary | ICD-10-CM | POA: Diagnosis not present

## 2014-01-03 DIAGNOSIS — C61 Malignant neoplasm of prostate: Secondary | ICD-10-CM | POA: Diagnosis not present

## 2014-01-09 DIAGNOSIS — R3915 Urgency of urination: Secondary | ICD-10-CM | POA: Diagnosis not present

## 2014-01-09 DIAGNOSIS — C61 Malignant neoplasm of prostate: Secondary | ICD-10-CM | POA: Diagnosis not present

## 2014-01-09 DIAGNOSIS — N401 Enlarged prostate with lower urinary tract symptoms: Secondary | ICD-10-CM | POA: Diagnosis not present

## 2014-05-09 DIAGNOSIS — C61 Malignant neoplasm of prostate: Secondary | ICD-10-CM | POA: Diagnosis not present

## 2014-05-09 DIAGNOSIS — E1142 Type 2 diabetes mellitus with diabetic polyneuropathy: Secondary | ICD-10-CM | POA: Diagnosis not present

## 2014-05-09 DIAGNOSIS — E119 Type 2 diabetes mellitus without complications: Secondary | ICD-10-CM | POA: Diagnosis not present

## 2014-05-09 DIAGNOSIS — H6123 Impacted cerumen, bilateral: Secondary | ICD-10-CM | POA: Diagnosis not present

## 2014-05-09 DIAGNOSIS — M5431 Sciatica, right side: Secondary | ICD-10-CM | POA: Diagnosis not present

## 2014-05-09 DIAGNOSIS — E559 Vitamin D deficiency, unspecified: Secondary | ICD-10-CM | POA: Diagnosis not present

## 2014-05-09 DIAGNOSIS — R208 Other disturbances of skin sensation: Secondary | ICD-10-CM | POA: Diagnosis not present

## 2014-05-09 DIAGNOSIS — R5383 Other fatigue: Secondary | ICD-10-CM | POA: Diagnosis not present

## 2014-05-09 DIAGNOSIS — Z951 Presence of aortocoronary bypass graft: Secondary | ICD-10-CM | POA: Diagnosis not present

## 2014-05-09 DIAGNOSIS — I480 Paroxysmal atrial fibrillation: Secondary | ICD-10-CM | POA: Diagnosis not present

## 2014-05-09 DIAGNOSIS — G629 Polyneuropathy, unspecified: Secondary | ICD-10-CM | POA: Diagnosis not present

## 2014-05-09 DIAGNOSIS — I10 Essential (primary) hypertension: Secondary | ICD-10-CM | POA: Diagnosis not present

## 2014-05-09 DIAGNOSIS — H6120 Impacted cerumen, unspecified ear: Secondary | ICD-10-CM | POA: Diagnosis not present

## 2014-06-12 DIAGNOSIS — D225 Melanocytic nevi of trunk: Secondary | ICD-10-CM | POA: Diagnosis not present

## 2014-06-12 DIAGNOSIS — L820 Inflamed seborrheic keratosis: Secondary | ICD-10-CM | POA: Diagnosis not present

## 2014-06-12 DIAGNOSIS — L570 Actinic keratosis: Secondary | ICD-10-CM | POA: Diagnosis not present

## 2014-06-12 DIAGNOSIS — L821 Other seborrheic keratosis: Secondary | ICD-10-CM | POA: Diagnosis not present

## 2014-06-16 DIAGNOSIS — J069 Acute upper respiratory infection, unspecified: Secondary | ICD-10-CM | POA: Diagnosis not present

## 2014-06-22 DIAGNOSIS — J209 Acute bronchitis, unspecified: Secondary | ICD-10-CM | POA: Diagnosis not present

## 2014-07-13 DIAGNOSIS — C61 Malignant neoplasm of prostate: Secondary | ICD-10-CM | POA: Diagnosis not present

## 2014-07-16 DIAGNOSIS — D692 Other nonthrombocytopenic purpura: Secondary | ICD-10-CM | POA: Diagnosis not present

## 2014-07-20 DIAGNOSIS — C61 Malignant neoplasm of prostate: Secondary | ICD-10-CM | POA: Diagnosis not present

## 2014-07-23 ENCOUNTER — Other Ambulatory Visit (HOSPITAL_COMMUNITY): Payer: Self-pay | Admitting: Urology

## 2014-07-23 DIAGNOSIS — C61 Malignant neoplasm of prostate: Secondary | ICD-10-CM

## 2014-08-23 ENCOUNTER — Encounter (HOSPITAL_COMMUNITY)
Admission: RE | Admit: 2014-08-23 | Discharge: 2014-08-23 | Disposition: A | Discharge status: Home / Self Care | Payer: Medicare Other | Source: Ambulatory Visit | Attending: Urology | Admitting: Urology

## 2014-08-23 DIAGNOSIS — C61 Malignant neoplasm of prostate: Secondary | ICD-10-CM | POA: Diagnosis present

## 2014-08-23 MED ORDER — TECHNETIUM TC 99M MEDRONATE IV KIT
25.1000 | PACK | Freq: Once | INTRAVENOUS | Status: AC | PRN
  Administered 2014-08-23: 25.1 via INTRAVENOUS

## 2014-09-24 DIAGNOSIS — M545 Low back pain: Secondary | ICD-10-CM | POA: Diagnosis not present

## 2014-09-24 DIAGNOSIS — M5137 Other intervertebral disc degeneration, lumbosacral region: Secondary | ICD-10-CM | POA: Diagnosis not present

## 2014-09-28 ENCOUNTER — Encounter: Payer: Self-pay | Admitting: Interventional Cardiology

## 2014-11-12 DIAGNOSIS — S73191A Other sprain of right hip, initial encounter: Secondary | ICD-10-CM | POA: Diagnosis not present

## 2014-11-12 DIAGNOSIS — I10 Essential (primary) hypertension: Secondary | ICD-10-CM | POA: Diagnosis not present

## 2014-11-12 DIAGNOSIS — R5383 Other fatigue: Secondary | ICD-10-CM | POA: Diagnosis not present

## 2014-11-12 DIAGNOSIS — E119 Type 2 diabetes mellitus without complications: Secondary | ICD-10-CM | POA: Diagnosis not present

## 2014-11-12 DIAGNOSIS — Z951 Presence of aortocoronary bypass graft: Secondary | ICD-10-CM | POA: Diagnosis not present

## 2014-11-12 DIAGNOSIS — Z79899 Other long term (current) drug therapy: Secondary | ICD-10-CM | POA: Diagnosis not present

## 2014-11-12 DIAGNOSIS — E559 Vitamin D deficiency, unspecified: Secondary | ICD-10-CM | POA: Diagnosis not present

## 2014-11-12 DIAGNOSIS — C61 Malignant neoplasm of prostate: Secondary | ICD-10-CM | POA: Diagnosis not present

## 2014-11-12 DIAGNOSIS — E1142 Type 2 diabetes mellitus with diabetic polyneuropathy: Secondary | ICD-10-CM | POA: Diagnosis not present

## 2014-11-12 DIAGNOSIS — Z0001 Encounter for general adult medical examination with abnormal findings: Secondary | ICD-10-CM | POA: Diagnosis not present

## 2014-11-12 DIAGNOSIS — Z1389 Encounter for screening for other disorder: Secondary | ICD-10-CM | POA: Diagnosis not present

## 2014-11-12 DIAGNOSIS — Z23 Encounter for immunization: Secondary | ICD-10-CM | POA: Diagnosis not present

## 2014-12-12 DIAGNOSIS — D1801 Hemangioma of skin and subcutaneous tissue: Secondary | ICD-10-CM | POA: Diagnosis not present

## 2014-12-12 DIAGNOSIS — L821 Other seborrheic keratosis: Secondary | ICD-10-CM | POA: Diagnosis not present

## 2014-12-12 DIAGNOSIS — L812 Freckles: Secondary | ICD-10-CM | POA: Diagnosis not present

## 2014-12-28 DIAGNOSIS — J209 Acute bronchitis, unspecified: Secondary | ICD-10-CM | POA: Diagnosis not present

## 2015-01-08 DIAGNOSIS — C61 Malignant neoplasm of prostate: Secondary | ICD-10-CM | POA: Diagnosis not present

## 2015-01-14 ENCOUNTER — Ambulatory Visit: Payer: Medicare Other | Admitting: Interventional Cardiology

## 2015-01-14 ENCOUNTER — Encounter: Payer: Self-pay | Admitting: Interventional Cardiology

## 2015-01-14 ENCOUNTER — Ambulatory Visit (INDEPENDENT_AMBULATORY_CARE_PROVIDER_SITE_OTHER): Payer: Medicare Other | Admitting: Interventional Cardiology

## 2015-01-14 VITALS — BP 130/60 | HR 73 | Ht 70.5 in | Wt 187.0 lb

## 2015-01-14 DIAGNOSIS — R5382 Chronic fatigue, unspecified: Secondary | ICD-10-CM | POA: Diagnosis not present

## 2015-01-14 DIAGNOSIS — I2510 Atherosclerotic heart disease of native coronary artery without angina pectoris: Secondary | ICD-10-CM | POA: Diagnosis not present

## 2015-01-14 DIAGNOSIS — I10 Essential (primary) hypertension: Secondary | ICD-10-CM | POA: Diagnosis not present

## 2015-01-14 DIAGNOSIS — E7800 Pure hypercholesterolemia, unspecified: Secondary | ICD-10-CM

## 2015-01-14 DIAGNOSIS — R208 Other disturbances of skin sensation: Secondary | ICD-10-CM

## 2015-01-14 DIAGNOSIS — E041 Nontoxic single thyroid nodule: Secondary | ICD-10-CM

## 2015-01-14 DIAGNOSIS — R200 Anesthesia of skin: Secondary | ICD-10-CM | POA: Insufficient documentation

## 2015-01-14 MED ORDER — ASPIRIN EC 81 MG PO TBEC: 81.0000 mg | DELAYED_RELEASE_TABLET | Freq: Every day | ORAL | Status: DC

## 2015-01-14 NOTE — Patient Instructions (Signed)
Medication Instructions:  Your physician has recommended you make the following change in your medication:  Decrease aspirin to 81 mg by mouth daily   Labwork: none  Testing/Procedures: none  Follow-Up: Your physician wants you to follow-up in: 12 months.  You will receive a reminder letter in the mail two months in advance. If you don't receive a letter, please call our office to schedule the follow-up appointment.   Any Other Special Instructions Will Be Listed Below (If Applicable).     If you need a refill on your cardiac medications before your next appointment, please call your pharmacy.   

## 2015-01-14 NOTE — Progress Notes (Signed)
Patient ID: Johnny Fox, male   DOB: 1928-02-02, 82 y.o.   MRN: [id number]     Cardiology Office Note   Date:  01/14/2015   ID:  Johnny Fox, DOB Jul 06, 1928, MRN [id number]  PCP:  Henrine Screws, MD    No chief complaint on file.  follow-up coronary artery disease   Wt Readings from Last 3 Encounters:  01/14/15 187 lb (84.823 kg)  12/06/13 191 lb 12.8 oz (87 kg)  02/15/13 185 lb (83.915 kg)       History of Present Illness: Johnny Fox is a 82 y.o. male  who has had CABG in 2004. HE carries NTG. No recent chest discomfort.  No nitroglycerin use in the last year.  Lipids were well controlled at last check. He used to play golf and walk the course. Now he rides, but walks more in the neighborhood. He walks in the park and does yardwork. He has more fatigue now and plays less. Limited also by spinal stenosis. He has had numbness in his right foot in particular. This has limited his walking.  Prior to CABG, he was not having many symptoms. He had a routine ECG and stress test showing CAD. He had only some DOE. No longer Walking 4 days / week, maybe 1-2x/week when the weather allows.  He has had some numbness in his feet. Numbness better with walking. CAD/ASCVD:   Denies : Dyspnea on exertion.  Fatigue.  Leg edema.  Orthopnea.  Palpitations.  Paroxysmal nocturnal dyspnea.  Syncope.   He certainly is not getting 150 minutes of exercise per week. He does report that he had minimal symptoms prior to his bypass surgery.   Past Medical History  Diagnosis Date  . Cancer [facility])     prostate   . Hypertension   . Coronary artery disease   . Dyslipidemia   . Diabetes mellitus without complication (HCC)     diet controlled  . Actinic keratoses     and seborrheic keratoses  . ED (erectile dysfunction)   . Onychomycosis   . Arrhythmia     afib postoperatively after CABG in 2004, No recurrence  . Elevated PSA     2010, workup with Dr. Risa Grill  .  H/O prostate biopsy   . Prostate cancer ([place])     PSA's run approx 7- 2011-2013  . Right shoulder injury     july 2014, Dr. Justice Britain  . Tick bite of abdomen     2013, treated with 3wks of doxycycline, tick engorged 3.50mm nodule left thyroid 4/14- seen on carotid u/s    Past Surgical History  Procedure Laterality Date  . Coronary artery bypass graft  2004  . Cardiac surgery       Current Outpatient Prescriptions  Medication Sig Dispense Refill  . aspirin 325 MG tablet Take 325 mg by mouth daily.    . cholecalciferol (VITAMIN D) 1000 UNITS tablet Take 1,000 Units by mouth daily.    . Coenzyme Q10 (COQ-10) 100 MG CAPS Take 1 capsule by mouth daily.    . nitroGLYCERIN (NITROSTAT) 0.4 MG SL tablet Place 0.4 mg under the tongue every 5 (five) minutes as needed for chest pain.    . Omega-3 Fatty Acids (FISH OIL) 1000 MG CAPS Take 1,000 mg by mouth 3 (three) times daily.    . rosuvastatin (CRESTOR) 5 MG tablet Take 5 mg by mouth. 1/2 tablet daily.    . valsartan (DIOVAN) 160 MG tablet Take 80 mg by  mouth daily.     No current facility-administered medications for this visit.    Allergies:   Review of patient's allergies indicates no active allergies.    Social History:  The patient  reports that he has never smoked. He does not have any smokeless tobacco history on file. He reports that he does not drink alcohol or use illicit drugs.   Family History:  The patient's family history includes CAD in his father; CVA in his father; Hypertension in his father; Stroke in his mother and sister. There is no history of Heart attack.    ROS:  Please see the history of present illness.   Otherwise, review of systems are positive for right foot numbness.   All other systems are reviewed and negative.    PHYSICAL EXAM: VS:  BP 130/60 mmHg  Pulse 73  Ht 5' 10.5" (1.791 m)  Wt 187 lb (84.823 kg)  BMI 26.44 kg/m2 , BMI Body mass index is 26.44 kg/(m^2). GEN: Well nourished, well developed,  in no acute distress HEENT: normal Neck: no JVD, carotid bruits, or masses Cardiac: RRR; no murmurs, rubs, or gallops,no edema ; 2+ right posterior tibial pulse Respiratory:  clear to auscultation bilaterally, normal work of breathing GI: soft, nontender, nondistended, + BS MS: no deformity or atrophy Skin: warm and dry, no rash Neuro:  Strength and sensation are intact Psych: euthymic mood, full affect   EKG:   The ekg ordered today demonstrates normal sinus rhythm, no significant ST segment changes   Recent Labs: No results found for requested labs within last 365 days.   Lipid Panel No results found for: CHOL, TRIG, HDL, CHOLHDL, VLDL, LDLCALC, LDLDIRECT   Other studies Reviewed: Additional studies/ records that were reviewed today with results demonstrating: 2014 nuclear stress test reviewed: Normal left jugular function. No ischemia..   ASSESSMENT AND PLAN:  1. CAD:  No angina. Continue aggressive secondary prevention. 2. HTN: Blood pressure well controlled. Continue valsartan at the current dose. Lab work followed by Dr. Inda Merlin. 3. Hyperlipidemia: Continue lipid-lowering therapy.  LDL target less than 100 4. Thyroid nodule: Noted on 2014 carotid Doppler. It was 3.8 mm. 5. Fatigue: This is likely multifactorial and a lot of this may be due to deconditioning. I have encouraged him strongly to walk 5 times a week 30 minutes a day. 6. Leg numbness: More on the right. This is chronic. May be related to his spinal stenosis. He will follow-up with Dr. Inda Merlin regarding this.   Current medicines are reviewed at length with the patient today.  The patient concerns regarding his medicines were addressed.  The following changes have been made:  Decrease aspirin 81 mg daily  Labs/ tests ordered today include:  No orders of the defined types were placed in this encounter.    Recommend 150 minutes/week of aerobic exercise Low fat, low carb, high fiber diet  recommended  Disposition:   FU in 1 year   Teresita Madura., MD  01/14/2015 3:14 PM    [facility] [place], [place], [place]  [address] Phone: [phone]; Fax: [phone]

## 2015-01-18 DIAGNOSIS — C61 Malignant neoplasm of prostate: Secondary | ICD-10-CM | POA: Diagnosis not present

## 2015-01-18 DIAGNOSIS — Z0000 Encounter for general adult medical examination without abnormal findings: Secondary | ICD-10-CM | POA: Diagnosis not present

## 2015-01-20 ENCOUNTER — Encounter (HOSPITAL_COMMUNITY): Payer: Self-pay | Admitting: Emergency Medicine

## 2015-01-20 ENCOUNTER — Emergency Department (HOSPITAL_COMMUNITY)
Admission: EM | Admit: 2015-01-20 | Discharge: 2015-01-20 | Disposition: A | Discharge status: Home / Self Care | Payer: Medicare Other | Attending: Emergency Medicine | Admitting: Emergency Medicine

## 2015-01-20 DIAGNOSIS — E119 Type 2 diabetes mellitus without complications: Secondary | ICD-10-CM | POA: Insufficient documentation

## 2015-01-20 DIAGNOSIS — Y998 Other external cause status: Secondary | ICD-10-CM | POA: Diagnosis not present

## 2015-01-20 DIAGNOSIS — S0181XA Laceration without foreign body of other part of head, initial encounter: Secondary | ICD-10-CM | POA: Insufficient documentation

## 2015-01-20 DIAGNOSIS — E785 Hyperlipidemia, unspecified: Secondary | ICD-10-CM | POA: Diagnosis not present

## 2015-01-20 DIAGNOSIS — Z862 Personal history of diseases of the blood and blood-forming organs and certain disorders involving the immune mechanism: Secondary | ICD-10-CM | POA: Diagnosis not present

## 2015-01-20 DIAGNOSIS — Z7982 Long term (current) use of aspirin: Secondary | ICD-10-CM | POA: Diagnosis not present

## 2015-01-20 DIAGNOSIS — I10 Essential (primary) hypertension: Secondary | ICD-10-CM | POA: Insufficient documentation

## 2015-01-20 DIAGNOSIS — Z87438 Personal history of other diseases of male genital organs: Secondary | ICD-10-CM | POA: Diagnosis not present

## 2015-01-20 DIAGNOSIS — I2510 Atherosclerotic heart disease of native coronary artery without angina pectoris: Secondary | ICD-10-CM | POA: Insufficient documentation

## 2015-01-20 DIAGNOSIS — Z79899 Other long term (current) drug therapy: Secondary | ICD-10-CM | POA: Insufficient documentation

## 2015-01-20 DIAGNOSIS — W01198A Fall on same level from slipping, tripping and stumbling with subsequent striking against other object, initial encounter: Secondary | ICD-10-CM | POA: Insufficient documentation

## 2015-01-20 DIAGNOSIS — Z951 Presence of aortocoronary bypass graft: Secondary | ICD-10-CM | POA: Diagnosis not present

## 2015-01-20 DIAGNOSIS — Y9389 Activity, other specified: Secondary | ICD-10-CM | POA: Diagnosis not present

## 2015-01-20 DIAGNOSIS — Z872 Personal history of diseases of the skin and subcutaneous tissue: Secondary | ICD-10-CM | POA: Insufficient documentation

## 2015-01-20 DIAGNOSIS — S0993XA Unspecified injury of face, initial encounter: Secondary | ICD-10-CM | POA: Diagnosis present

## 2015-01-20 DIAGNOSIS — Y92007 Garden or yard of unspecified non-institutional (private) residence as the place of occurrence of the external cause: Secondary | ICD-10-CM | POA: Insufficient documentation

## 2015-01-20 DIAGNOSIS — Z8546 Personal history of malignant neoplasm of prostate: Secondary | ICD-10-CM | POA: Diagnosis not present

## 2015-01-20 DIAGNOSIS — Z23 Encounter for immunization: Secondary | ICD-10-CM | POA: Diagnosis not present

## 2015-01-20 MED ORDER — TETANUS-DIPHTH-ACELL PERTUSSIS 5-2.5-18.5 LF-MCG/0.5 IM SUSP
0.5000 mL | Freq: Once | INTRAMUSCULAR | Status: AC
  Administered 2015-01-20: 0.5 mL via INTRAMUSCULAR
  Filled 2015-01-20: qty 0.5

## 2015-01-20 MED ORDER — LIDOCAINE-EPINEPHRINE (PF) 2 %-1:200000 IJ SOLN: 10.0000 mL | Freq: Once | INTRAMUSCULAR | Status: DC

## 2015-01-20 MED ORDER — LIDOCAINE HCL 1 % IJ SOLN
20.0000 mL | Freq: Once | INTRAMUSCULAR | Status: AC
  Administered 2015-01-20: 20 mL via INTRADERMAL
  Filled 2015-01-20: qty 20

## 2015-01-20 NOTE — ED Notes (Signed)
Patient d/c'd self care.  F/U discussed.  Patient verbalized understanding. 

## 2015-01-20 NOTE — Discharge Instructions (Signed)
Facial Laceration ° A facial laceration is a cut on the face. These injuries can be painful and cause bleeding. Lacerations usually heal quickly, but they need special care to reduce scarring. °DIAGNOSIS  °Your health care provider will take a medical history, ask for details about how the injury occurred, and examine the wound to determine how deep the cut is. °TREATMENT  °Some facial lacerations may not require closure. Others may not be able to be closed because of an increased risk of infection. The risk of infection and the chance for successful closure will depend on various factors, including the amount of time since the injury occurred. °The wound may be cleaned to help prevent infection. If closure is appropriate, pain medicines may be given if needed. Your health care provider will use stitches (sutures), wound glue (adhesive), or skin adhesive strips to repair the laceration. These tools bring the skin edges together to allow for faster healing and a better cosmetic outcome. If needed, you may also be given a tetanus shot. °HOME CARE INSTRUCTIONS °· Only take over-the-counter or prescription medicines as directed by your health care provider. °· Follow your health care provider's instructions for wound care. These instructions will vary depending on the technique used for closing the wound. °For Sutures: °· Keep the wound clean and dry.   °· If you were given a bandage (dressing), you should change it at least once a day. Also change the dressing if it becomes wet or dirty, or as directed by your health care provider.   °· Wash the wound with soap and water 2 times a day. Rinse the wound off with water to remove all soap. Pat the wound dry with a clean towel.   °· After cleaning, apply a thin layer of the antibiotic ointment recommended by your health care provider. This will help prevent infection and keep the dressing from sticking.   °· You may shower as usual after the first 24 hours. Do not soak the  wound in water until the sutures are removed.   °· Get your sutures removed as directed by your health care provider. With facial lacerations, sutures should usually be taken out after 4-5 days to avoid stitch marks.   °· Wait a few days after your sutures are removed before applying any makeup. °For Skin Adhesive Strips: °· Keep the wound clean and dry.   °· Do not get the skin adhesive strips wet. You may bathe carefully, using caution to keep the wound dry.   °· If the wound gets wet, pat it dry with a clean towel.   °· Skin adhesive strips will fall off on their own. You may trim the strips as the wound heals. Do not remove skin adhesive strips that are still stuck to the wound. They will fall off in time.   °For Wound Adhesive: °· You may briefly wet your wound in the shower or bath. Do not soak or scrub the wound. Do not swim. Avoid periods of heavy sweating until the skin adhesive has fallen off on its own. After showering or bathing, gently pat the wound dry with a clean towel.   °· Do not apply liquid medicine, cream medicine, ointment medicine, or makeup to your wound while the skin adhesive is in place. This may loosen the film before your wound is healed.   °· If a dressing is placed over the wound, be careful not to apply tape directly over the skin adhesive. This may cause the adhesive to be pulled off before the wound is healed.   °· Avoid   prolonged exposure to sunlight or tanning lamps while the skin adhesive is in place. °· The skin adhesive will usually remain in place for 5-10 days, then naturally fall off the skin. Do not pick at the adhesive film.   °After Healing: °Once the wound has healed, cover the wound with sunscreen during the day for 1 full year. This can help minimize scarring. Exposure to ultraviolet light in the first year will darken the scar. It can take 1-2 years for the scar to lose its redness and to heal completely.  °SEEK MEDICAL CARE IF: °· You have a fever. °SEEK IMMEDIATE  MEDICAL CARE IF: °· You have redness, pain, or swelling around the wound.   °· You see a yellowish-white fluid (pus) coming from the wound.   °  °This information is not intended to replace advice given to you by your health care provider. Make sure you discuss any questions you have with your health care provider. °  °Document Released: 01/30/2004 Document Revised: 01/12/2014 Document Reviewed: 08/04/2012 °Elsevier Interactive Patient Education ©2016 [employer] ° °

## 2015-01-20 NOTE — ED Notes (Signed)
Well appearing 82 y/o pt presents to ER with abrasions to face from a trip and fall over a root in their front yard.  States that he hit his face on a brick wall.  States that he did not pass out.  No thinners.

## 2015-01-20 NOTE — ED Provider Notes (Signed)
LACERATION REPAIR Performed by: Marella Chimes Authorized by: Marella Chimes Consent: verbal consent obtained. Risks and benefits: risks, benefits and alternatives were discussed Consent given by: patient Patient identity confirmed: provided demographic data Prepped and Draped in normal sterile fashion Wound explored  Laceration Location: right lateral chin  Laceration Length: 3 cm  No foreign bodies seen or palpated  Anesthesia: local infiltration  Local anesthetic: lidocaine 1% without epinephrine  Anesthetic total: 5 ml  Irrigation method: syringe Amount of cleaning: standard  Skin closure: 6-0 prolene   Number of sutures: 3  Technique: simple interrupted  Patient tolerance: patient tolerated the procedure well with no immediate complications.  BP 155/76 mmHg  Pulse 80  Temp(Src) 98.6 F (37 C) (Oral)  Resp 18  SpO2 100%   Marella Chimes, [place] 01/20/15 2334

## 2015-01-20 NOTE — ED Provider Notes (Signed)
CSN: [id number]     Arrival date & time 01/20/15  1714 History   First MD Initiated Contact with Patient 01/20/15 2110     Chief Complaint  Patient presents with  . Facial Injury  . Fall    HPI Patient presents to the emergency room for evaluation of a facial injury. He was walking outside and tripped in the front yard. He struck his face against a brick wall. Patient sustained an abrasion to the face as well as a laceration to his chin. He does not take any blood thinning agents. He denies any neck pain. No loss of consciousness. He thought his laceration may require sutures. Past Medical History  Diagnosis Date  . Cancer [facility])     prostate   . Hypertension   . Coronary artery disease   . Dyslipidemia   . Diabetes mellitus without complication (HCC)     diet controlled  . Actinic keratoses     and seborrheic keratoses  . ED (erectile dysfunction)   . Onychomycosis   . Arrhythmia     afib postoperatively after CABG in 2004, No recurrence  . Elevated PSA     2010, workup with Dr. Risa Grill  . H/O prostate biopsy   . Prostate cancer ([place])     PSA's run approx 7- 2011-2013  . Right shoulder injury     july 2014, Dr. Justice Britain  . Tick bite of abdomen     2013, treated with 3wks of doxycycline, tick engorged 3.68mm nodule left thyroid 4/14- seen on carotid u/s   Past Surgical History  Procedure Laterality Date  . Coronary artery bypass graft  2004  . Cardiac surgery     Family History  Problem Relation Age of Onset  . Hypertension Father   . CVA Father   . CAD Father   . Heart attack Neg Hx   . Stroke Mother   . Stroke Sister    Social History  Substance Use Topics  . Smoking status: Never Smoker   . Smokeless tobacco: None  . Alcohol Use: No    Review of Systems  All other systems reviewed and are negative.     Allergies  Food  Home Medications   Prior to Admission medications   Medication Sig Start Date End Date Taking? Authorizing Provider  aspirin  325 MG tablet Take 325 mg by mouth daily.   Yes Historical Provider, MD  cholecalciferol (VITAMIN D) 1000 UNITS tablet Take 1,000 Units by mouth daily.   Yes Historical Provider, MD  Coenzyme Q10 (COQ-10) 100 MG CAPS Take 1 capsule by mouth daily.   Yes Historical Provider, MD  nitroGLYCERIN (NITROSTAT) 0.4 MG SL tablet Place 0.4 mg under the tongue every 5 (five) minutes as needed for chest pain.   Yes Historical Provider, MD  Omega-3 Fatty Acids (FISH OIL) 1000 MG CAPS Take 1,000 mg by mouth 3 (three) times daily.   Yes Historical Provider, MD  rosuvastatin (CRESTOR) 5 MG tablet Take 2.5 mg by mouth every evening.    Yes Historical Provider, MD  TURMERIC PO Take 1 capsule by mouth daily.   Yes Historical Provider, MD  valsartan (DIOVAN) 160 MG tablet Take 80 mg by mouth daily.   Yes Historical Provider, MD  aspirin EC 81 MG tablet Take 1 tablet (81 mg total) by mouth daily. Patient not taking: Reported on 01/20/2015 01/14/15   Jettie Booze, MD   BP 155/76 mmHg  Pulse 80  Temp(Src) 98.6 F (37  C) (Oral)  Resp 18  SpO2 100% Physical Exam  Constitutional: He appears well-developed and well-nourished. No distress.  HENT:  Head: Normocephalic.  Right Ear: External ear normal.  Left Ear: External ear normal.  Abrasions noted on the right cheek, linear laceration on the lateral aspect of the right chin  Eyes: Conjunctivae are normal. Right eye exhibits no discharge. Left eye exhibits no discharge. No scleral icterus.  Neck: Neck supple. No tracheal deviation present.  Cardiovascular: Normal rate, regular rhythm and intact distal pulses.   Pulmonary/Chest: Effort normal and breath sounds normal. No stridor. No respiratory distress. He has no wheezes. He has no rales.  Abdominal: Soft. Bowel sounds are normal. He exhibits no distension. There is no tenderness. There is no rebound and no guarding.  Musculoskeletal: He exhibits no edema or tenderness.       Cervical back: Normal.        Thoracic back: Normal.       Lumbar back: Normal.  Neurological: He is alert. He has normal strength. No cranial nerve deficit (no facial droop, extraocular movements intact, no slurred speech) or sensory deficit. He exhibits normal muscle tone. He displays no seizure activity. Coordination normal.  Skin: Skin is warm and dry. No rash noted.  Psychiatric: He has a normal mood and affect.  Nursing note and vitals reviewed.   ED Course  Procedures (including critical care time)   MDM   Final diagnoses:  Facial laceration, initial encounter   Laceration without signs of fracture on exam.  No c spine or facial bone ttp.  No LOC.  Doubt serious head injury  Laceration repair by PA [place].  DC home with head injury precautions.  Suture removal in 5 days     Dorie Rank, MD 01/20/15 2246

## 2015-01-20 NOTE — ED Notes (Signed)
PA at bedside.

## 2015-01-25 DIAGNOSIS — S0181XA Laceration without foreign body of other part of head, initial encounter: Secondary | ICD-10-CM | POA: Diagnosis not present

## 2015-02-01 ENCOUNTER — Other Ambulatory Visit: Payer: Self-pay

## 2015-02-01 MED ORDER — NITROGLYCERIN 0.4 MG SL SUBL: 0.4000 mg | SUBLINGUAL_TABLET | SUBLINGUAL | Status: DC | PRN

## 2015-03-27 DIAGNOSIS — R200 Anesthesia of skin: Secondary | ICD-10-CM | POA: Diagnosis not present

## 2015-03-27 DIAGNOSIS — R202 Paresthesia of skin: Secondary | ICD-10-CM | POA: Diagnosis not present

## 2015-03-27 DIAGNOSIS — R739 Hyperglycemia, unspecified: Secondary | ICD-10-CM | POA: Diagnosis not present

## 2015-04-10 ENCOUNTER — Ambulatory Visit: Payer: Medicare Other | Admitting: Neurology

## 2015-05-15 DIAGNOSIS — M5431 Sciatica, right side: Secondary | ICD-10-CM | POA: Diagnosis not present

## 2015-05-15 DIAGNOSIS — C61 Malignant neoplasm of prostate: Secondary | ICD-10-CM | POA: Diagnosis not present

## 2015-05-15 DIAGNOSIS — I10 Essential (primary) hypertension: Secondary | ICD-10-CM | POA: Diagnosis not present

## 2015-05-15 DIAGNOSIS — E1142 Type 2 diabetes mellitus with diabetic polyneuropathy: Secondary | ICD-10-CM | POA: Diagnosis not present

## 2015-05-15 DIAGNOSIS — Z951 Presence of aortocoronary bypass graft: Secondary | ICD-10-CM | POA: Diagnosis not present

## 2015-05-15 DIAGNOSIS — E559 Vitamin D deficiency, unspecified: Secondary | ICD-10-CM | POA: Diagnosis not present

## 2015-05-15 DIAGNOSIS — H6123 Impacted cerumen, bilateral: Secondary | ICD-10-CM | POA: Diagnosis not present

## 2015-05-15 DIAGNOSIS — R202 Paresthesia of skin: Secondary | ICD-10-CM | POA: Diagnosis not present

## 2015-05-15 DIAGNOSIS — I480 Paroxysmal atrial fibrillation: Secondary | ICD-10-CM | POA: Diagnosis not present

## 2015-05-15 DIAGNOSIS — R5383 Other fatigue: Secondary | ICD-10-CM | POA: Diagnosis not present

## 2015-05-15 DIAGNOSIS — G629 Polyneuropathy, unspecified: Secondary | ICD-10-CM | POA: Diagnosis not present

## 2015-05-15 DIAGNOSIS — R200 Anesthesia of skin: Secondary | ICD-10-CM | POA: Diagnosis not present

## 2015-05-20 DIAGNOSIS — M12811 Other specific arthropathies, not elsewhere classified, right shoulder: Secondary | ICD-10-CM | POA: Diagnosis not present

## 2015-05-29 DIAGNOSIS — H5203 Hypermetropia, bilateral: Secondary | ICD-10-CM | POA: Diagnosis not present

## 2015-05-29 DIAGNOSIS — H43392 Other vitreous opacities, left eye: Secondary | ICD-10-CM | POA: Diagnosis not present

## 2015-05-29 DIAGNOSIS — H52223 Regular astigmatism, bilateral: Secondary | ICD-10-CM | POA: Diagnosis not present

## 2015-05-29 DIAGNOSIS — Z961 Presence of intraocular lens: Secondary | ICD-10-CM | POA: Diagnosis not present

## 2015-05-29 DIAGNOSIS — H3530 Unspecified macular degeneration: Secondary | ICD-10-CM | POA: Diagnosis not present

## 2015-05-29 DIAGNOSIS — H524 Presbyopia: Secondary | ICD-10-CM | POA: Diagnosis not present

## 2015-06-12 DIAGNOSIS — L570 Actinic keratosis: Secondary | ICD-10-CM | POA: Diagnosis not present

## 2015-06-12 DIAGNOSIS — L309 Dermatitis, unspecified: Secondary | ICD-10-CM | POA: Diagnosis not present

## 2015-06-12 DIAGNOSIS — L820 Inflamed seborrheic keratosis: Secondary | ICD-10-CM | POA: Diagnosis not present

## 2015-06-12 DIAGNOSIS — L853 Xerosis cutis: Secondary | ICD-10-CM | POA: Diagnosis not present

## 2015-06-19 ENCOUNTER — Encounter: Payer: Self-pay | Admitting: Podiatry

## 2015-06-19 ENCOUNTER — Ambulatory Visit (INDEPENDENT_AMBULATORY_CARE_PROVIDER_SITE_OTHER): Payer: Medicare Other | Admitting: Podiatry

## 2015-06-19 VITALS — BP 126/69 | HR 75 | Resp 14

## 2015-06-19 DIAGNOSIS — B351 Tinea unguium: Secondary | ICD-10-CM

## 2015-06-19 DIAGNOSIS — I2510 Atherosclerotic heart disease of native coronary artery without angina pectoris: Secondary | ICD-10-CM | POA: Diagnosis not present

## 2015-06-19 DIAGNOSIS — L03031 Cellulitis of right toe: Secondary | ICD-10-CM | POA: Diagnosis not present

## 2015-06-19 DIAGNOSIS — M79676 Pain in unspecified toe(s): Secondary | ICD-10-CM

## 2015-06-19 NOTE — Progress Notes (Signed)
   Subjective:    Patient ID: Johnny Fox, male    DOB: [birth year], 82 y.o.   MRN: [id number]  HPI this patient presents the office with chief complaint of long thick painful nails. He states that he has a thick nail that is growing into the corner of the big toe of the right foot that is painful and sore on each and every step. He states that the pain is worsening over time and he mentioned it to his medical doctor who recommended he be seen by a podiatrist. He states he has pain upon walking and wearing his shoes. He presents the office today for definitive evaluation and treatment of his nails. He states that the right great toenail does have an ingrowing nail that he frequently works on himself that causes pain and discomfort. He desires the ingrowing toenail portion of the nail to be evaluated and treated    Review of Systems  All other systems reviewed and are negative.      Objective:   Physical Exam GENERAL APPEARANCE: Alert, conversant. Appropriately groomed. No acute distress.  VASCULAR: Pedal pulses are  palpable at  [facility] and PT bilateral.  Capillary refill time is immediate to all digits,  Normal temperature gradient.  Digital hair growth is present bilateral  NEUROLOGIC: sensation is normal to 5.07 monofilament at 5/5 sites bilateral.  Light touch is intact bilateral, Muscle strength normal.  MUSCULOSKELETAL: acceptable muscle strength, tone and stability bilateral.  Intrinsic muscluature intact bilateral.  Rectus appearance of foot and digits noted bilateral.   DERMATOLOGIC: skin color, texture, and turgor are within normal limits.  No preulcerative lesions or ulcers  are seen, no interdigital maceration noted.  No open lesions present.  No drainage noted.  NAILS  Thick disfigured discolored nails both feet.  His big toe right foot is red swollen with minimal skin lesion at the site the nail grows into his skin distally.         Assessment & Plan:  Onychomycosis   Paronychia right hallux nail.  IE  Debridement of thick mycotic nails.   Incision and Drainage right hallux toenail.  Heome soaks recommended.  RTC 3 months.   Gardiner Barefoot DPM

## 2015-08-05 DIAGNOSIS — C61 Malignant neoplasm of prostate: Secondary | ICD-10-CM | POA: Diagnosis not present

## 2015-09-18 ENCOUNTER — Encounter: Payer: Self-pay | Admitting: Podiatry

## 2015-09-18 ENCOUNTER — Ambulatory Visit (INDEPENDENT_AMBULATORY_CARE_PROVIDER_SITE_OTHER): Payer: Medicare Other | Admitting: Podiatry

## 2015-09-18 VITALS — Ht 70.5 in | Wt 185.0 lb

## 2015-09-18 DIAGNOSIS — G5762 Lesion of plantar nerve, left lower limb: Secondary | ICD-10-CM

## 2015-09-18 DIAGNOSIS — G5782 Other specified mononeuropathies of left lower limb: Secondary | ICD-10-CM

## 2015-09-18 DIAGNOSIS — M79676 Pain in unspecified toe(s): Secondary | ICD-10-CM

## 2015-09-18 DIAGNOSIS — B351 Tinea unguium: Secondary | ICD-10-CM | POA: Diagnosis not present

## 2015-09-18 NOTE — Patient Instructions (Signed)
Acoustic Neuroma  Acoustic neuroma, also called schwannoma, is a growth (tumor) on a nerve in your inner ear. It is not cancerous (benign). The tumor grows on the eighth cranial nerve. This nerve passes between your brain and your inner ear through a bony tunnel (internal auditory canal). The eighth cranial nerve is important for hearing and balance.  As the tumor gets larger, it has less room to grow inside the canal. It starts to press on the nerve and can cause problems with hearing or balance. Nerves in your face may also be affected. Acoustic neuroma usually only happens on one side.  CAUSES   An overgrowth of the cells (Schwann cells) that make up the outer layer of the eighth cranial nerve causes an acoustic neuroma.   You may be at higher risk for acoustic neuroma if you have neurofibromatosis 2. This condition is caused by a defective gene (mutation) that can be passed down through families. Neurofibromatosis 2 can also cause tumors to grow on other nerves. It is a rare cause of acoustic neuroma but a common cause of acoustic neuroma that occurs on both sides.  SIGNS AND SYMPTOMS   If the tumor is large enough to produce symptoms, the most common one is hearing loss on one side. Other signs and symptoms may develop over time. These include:   · Ringing in the ear (tinnitus).  · Loss of balance or a sensation of spinning (vertigo).  · Headache.  · Facial numbness or weakness.  DIAGNOSIS   Acoustic neuroma may be diagnosed by:   · Physical exam and medical history.  · A hearing test (audiogram).  · Imaging tests of your inner ear and brain, such as:    MRI.    X-ray.  TREATMENT   Treatment of acoustic neuroma depends on your symptoms, the size of the tumor, and your overall health. Treatment may include:   · Medicines, such as anticonvulsants or corticosteroids.  · Monitoring the tumor. Acoustic neuroma is a slow-growing tumor. Treatment may not be needed unless the tumor is large and causing  symptoms.  · Surgery. This may be the best option if you have symptoms or a tumor that is growing. Surgery using an operating-room microscope may be done to remove the tumor and save your nerve.  · X-ray treatment (radiosurgery). This procedure does not remove the whole tumor but may reduce the size and slow its growth.  HOME CARE INSTRUCTIONS   · Take medicines only as directed by your health care provider.  · Keep all follow-up visits as directed by your health care provider. This is especially important if you have an acoustic neuroma that is being watched.  SEEK MEDICAL CARE IF:  · Your headache is worse.  · Your tinnitus is worse.  · Your vertigo is worse.  · Your facial weakness or numbness is worse.  · Your hearing loss is worse.  · You have new symptoms.  SEEK IMMEDIATE MEDICAL CARE IF:   · You develop severe dizziness.  · You have difficulty standing or walking.  · You develop a severe headache.  · You pass out or lose consciousness.  · You have a seizure.     This information is not intended to replace advice given to you by your health care provider. Make sure you discuss any questions you have with your health care provider.     Document Released: 10/01/2004 Document Revised: 01/12/2014 Document Reviewed: 04/26/2013  Elsevier Interactive Patient Education ©2016 Elsevier 

## 2015-09-18 NOTE — Progress Notes (Signed)
   Subjective:    Patient ID: Johnny Fox, male    DOB: [birth year], 82 y.o.   MRN: [id number]  HPI this patient presents the office with chief complaint of long thick painful nails. He states that he has a thick nail that is growing into the corners big toe right foot.  Patient has thick disfigured discolored nails both feet.  Pain noted walking and wearing his shoes.   Review of Systems  All other systems reviewed and are negative.      Objective:   Physical Exam GENERAL APPEARANCE: Alert, conversant. Appropriately groomed. No acute distress.  VASCULAR: Pedal pulses are  palpable at  [facility] and PT bilateral.  Capillary refill time is immediate to all digits,  Normal temperature gradient.  Digital hair growth is present bilateral  NEUROLOGIC: sensation is normal to 5.07 monofilament at 5/5 sites bilateral.  Light touch is intact bilateral, Muscle strength normal.  MUSCULOSKELETAL: acceptable muscle strength, tone and stability bilateral.  Intrinsic muscluature intact bilateral.  Rectus appearance of foot and digits noted bilateral. Palpable pain third interspace left foot with minimal pain elicited.  DERMATOLOGIC: skin color, texture, and turgor are within normal limits.  No preulcerative lesions or ulcers  are seen, no interdigital maceration noted.  No open lesions present.  No drainage noted.  NAILS  Thick disfigured discolored nails both feet.  Nail spicule medial border right hallux..         Assessment & Plan:  Onychomycosis   IE  Debridement of thick mycotic nails.     RTC 3 months.   Gardiner Barefoot DPM

## 2015-10-10 DIAGNOSIS — Z23 Encounter for immunization: Secondary | ICD-10-CM | POA: Diagnosis not present

## 2015-12-11 ENCOUNTER — Encounter: Payer: Self-pay | Admitting: Podiatry

## 2015-12-11 ENCOUNTER — Ambulatory Visit (INDEPENDENT_AMBULATORY_CARE_PROVIDER_SITE_OTHER): Payer: Medicare Other | Admitting: Podiatry

## 2015-12-11 VITALS — Ht 70.5 in | Wt 185.0 lb

## 2015-12-11 DIAGNOSIS — B351 Tinea unguium: Secondary | ICD-10-CM

## 2015-12-11 DIAGNOSIS — M79676 Pain in unspecified toe(s): Secondary | ICD-10-CM | POA: Diagnosis not present

## 2015-12-11 NOTE — Patient Instructions (Signed)
Morton Neuralgia Introduction Morton neuralgia is a type of foot pain in the area closest to your toes. This area is sometimes called the ball of your foot. Morton neuralgia occurs when a branch of a nerve in your foot (digital nerve) becomes compressed. When this happens over a long period of time, the nerve can thicken (neuroma) and cause pain. This usually occurs between the third and fourth toe. Morton neuralgia can come and go but may get worse over time. What are the causes? Your digital nerve can become compressed and stretched at a point where it passes under a thick band of tissue that connects your toes (intermetatarsal ligament). Morton neuralgia can be caused by mild repetitive damage in this area. This type of damage can result from:  Activities such as running or jumping.  Wearing shoes that are too tight. What increases the risk? You may be at risk for Morton neuralgia if you:  Are male.  Wear high heels.  Wear shoes that are narrow or tight.  Participate in activities that stretch your toes. These include:  Running.  Ballet.  Long-distance walking. What are the signs or symptoms? The first symptom of Morton neuralgia is pain that spreads from the ball of your foot to your toes. It may feel like you are walking on a marble. Pain usually gets worse with walking and goes away at night. Other symptoms may include numbness and cramping of your toes. How is this diagnosed? Your health care provider will do a physical exam. When doing the exam, your health care provider may:  Squeeze your foot just behind your toe.  Ask you to move your toes to check for pain. You may also have tests on your foot to confirm the diagnosis. These may include:  An X-ray.  An MRI. How is this treated? Treatment for Morton neuralgia may be as simple as changing the kind of shoes you wear. Other treatments may include:  Wearing a supportive pad (orthosis) under the front of your foot.  This lifts your toe bones and takes pressure off the nerve.  Getting injections of numbing medicine and anti-inflammatory medicine (steroid) in the nerve.  Having surgery to remove part of the thickened nerve. Follow these instructions at home:  Take medicine only as directed by your health care provider.  Wear soft-soled shoes with a wide toe area.  Stop activities that may be causing pain.  Elevate your foot when resting.  Massage your foot.  Apply ice to the injured area:  Put ice in a plastic bag.  Place a towel between your skin and the bag.  Leave the ice on for 20 minutes, 2-3 times a day.  Keep all follow-up visits as directed by your health care provider. This is important. Contact a health care provider if:  Home care instructions are not helping you get better.  Your symptoms change or get worse. This information is not intended to replace advice given to you by your health care provider. Make sure you discuss any questions you have with your health care provider. Document Released: 03/30/2000 Document Revised: 05/30/2015 Document Reviewed: 02/22/2013  2017 Elsevier  

## 2015-12-11 NOTE — Progress Notes (Signed)
Complaint:  Visit Type: Patient returns to my office for continued preventative foot care services. Complaint: Patient states" my nails have grown long and thick and become painful to walk and wear shoes" . The patient presents for preventative foot care services. No changes to ROS  Podiatric Exam: Vascular: dorsalis pedis and posterior tibial pulses are palpable bilateral. Capillary return is immediate. Temperature gradient is WNL. Skin turgor WNL  Sensorium: Normal Semmes Weinstein monofilament test. Normal tactile sensation bilaterally. Nail Exam: Pt has thick disfigured discolored nails with subungual debris noted bilateral entire nail hallux through fifth toenails Ulcer Exam: There is no evidence of ulcer or pre-ulcerative changes or infection. Orthopedic Exam: Muscle tone and strength are WNL. No limitations in general ROM. No crepitus or effusions noted. Foot type and digits show no abnormalities. Bony prominences are unremarkable. Skin: No Porokeratosis. No infection or ulcers  Diagnosis:  Onychomycosis, , Pain in right toe, pain in left toes  Treatment & Plan Procedures and Treatment: Consent by patient was obtained for treatment procedures. The patient understood the discussion of treatment and procedures well. All questions were answered thoroughly reviewed. Debridement of mycotic and hypertrophic toenails, 1 through 5 bilateral and clearing of subungual debris. No ulceration, no infection noted.  Return Visit-Office Procedure: Patient instructed to return to the office for a follow up visit 3 months   for continued evaluation and treatment.    Jeannemarie Sawaya DPM 

## 2015-12-13 DIAGNOSIS — Z1389 Encounter for screening for other disorder: Secondary | ICD-10-CM | POA: Diagnosis not present

## 2015-12-13 DIAGNOSIS — E1142 Type 2 diabetes mellitus with diabetic polyneuropathy: Secondary | ICD-10-CM | POA: Diagnosis not present

## 2015-12-13 DIAGNOSIS — C61 Malignant neoplasm of prostate: Secondary | ICD-10-CM | POA: Diagnosis not present

## 2015-12-13 DIAGNOSIS — R5383 Other fatigue: Secondary | ICD-10-CM | POA: Diagnosis not present

## 2015-12-13 DIAGNOSIS — G629 Polyneuropathy, unspecified: Secondary | ICD-10-CM | POA: Diagnosis not present

## 2015-12-13 DIAGNOSIS — R202 Paresthesia of skin: Secondary | ICD-10-CM | POA: Diagnosis not present

## 2015-12-13 DIAGNOSIS — H6120 Impacted cerumen, unspecified ear: Secondary | ICD-10-CM | POA: Diagnosis not present

## 2015-12-13 DIAGNOSIS — Z79899 Other long term (current) drug therapy: Secondary | ICD-10-CM | POA: Diagnosis not present

## 2015-12-13 DIAGNOSIS — E559 Vitamin D deficiency, unspecified: Secondary | ICD-10-CM | POA: Diagnosis not present

## 2015-12-13 DIAGNOSIS — Z951 Presence of aortocoronary bypass graft: Secondary | ICD-10-CM | POA: Diagnosis not present

## 2015-12-13 DIAGNOSIS — I10 Essential (primary) hypertension: Secondary | ICD-10-CM | POA: Diagnosis not present

## 2015-12-13 DIAGNOSIS — Z0001 Encounter for general adult medical examination with abnormal findings: Secondary | ICD-10-CM | POA: Diagnosis not present

## 2015-12-13 DIAGNOSIS — I2510 Atherosclerotic heart disease of native coronary artery without angina pectoris: Secondary | ICD-10-CM | POA: Diagnosis not present

## 2015-12-13 DIAGNOSIS — I480 Paroxysmal atrial fibrillation: Secondary | ICD-10-CM | POA: Diagnosis not present

## 2015-12-13 DIAGNOSIS — E7800 Pure hypercholesterolemia, unspecified: Secondary | ICD-10-CM | POA: Diagnosis not present

## 2015-12-19 DIAGNOSIS — J069 Acute upper respiratory infection, unspecified: Secondary | ICD-10-CM | POA: Diagnosis not present

## 2016-01-26 NOTE — Progress Notes (Signed)
Patient ID: Johnny Fox, male   DOB: 1928-03-21, 83 y.o.   MRN: [id number]     Cardiology Office Note   Date:  01/27/2016   ID:  Johnny Fox, DOB December 30, 1928, MRN [id number]  PCP:  Johnny Screws, MD    Chief Complaint  Patient presents with  . Atherosclerosis of native coronary artery of native heart wi   follow-up coronary artery disease   Wt Readings from Last 3 Encounters:  01/27/16 184 lb 6.4 oz (83.6 kg)  12/11/15 185 lb (83.9 kg)  09/18/15 185 lb (83.9 kg)       History of Present Illness: Johnny Fox is a 83 y.o. male  who has had CABG in 2004. HE carries NTG. No recent chest discomfort.  No nitroglycerin use in the last year.  Lipids were well controlled at last check. He used to play golf and walk the course. Now he rides, but walks more in the neighborhood. He walks in the park and does yardwork. He has more fatigue now and plays less. Limited also by spinal stenosis. He has had numbness in his right foot in particular. This has limited his walking.  Prior to CABG, he was not having many symptoms. He had a routine ECG and stress test showing CAD. He had only some DOE. No longer Walking 4 days / week, maybe 1-2x/week when the weather allows.  He has had some numbness in his feet. Numbness better with walking. CAD/ASCVD:   Denies : Dyspnea on exertion. Fatigue. Leg edema. Orthopnea. Palpitations. Paroxysmal nocturnal dyspnea. Syncope.   He certainly is not getting 150 minutes of exercise per week. He does report that he had minimal symptoms prior to his bypass surgery.  Exercise limited by foot pain.  He has neuropathic pain.  His balance is off as well.  He has fallen in the past.  He had a shoulder problem after a fall but he has been given exercises.  No plans for surgery.    Past Medical History:  Diagnosis Date  . Actinic keratoses    and seborrheic keratoses  . Arrhythmia    afib postoperatively after CABG in 2004, No  recurrence  . Cancer [facility])    prostate   . Coronary artery disease   . Diabetes mellitus without complication (HCC)    diet controlled  . Dyslipidemia   . ED (erectile dysfunction)   . Elevated PSA    2010, workup with Dr. Risa Fox  . H/O prostate biopsy   . Hypertension   . Onychomycosis   . Prostate cancer ([place])    PSA's run approx 7- 2011-2013  . Right shoulder injury    july 2014, Dr. Justice Fox  . Tick bite of abdomen    2013, treated with 3wks of doxycycline, tick engorged 3.64mm nodule left thyroid 4/14- seen on carotid u/s    Past Surgical History:  Procedure Laterality Date  . CARDIAC SURGERY    . CORONARY ARTERY BYPASS GRAFT  2004     Current Outpatient Prescriptions  Medication Sig Dispense Refill  . aspirin EC 81 MG tablet Take 1 tablet (81 mg total) by mouth daily. 90 tablet 3  . cholecalciferol (VITAMIN D) 1000 UNITS tablet Take 1,000 Units by mouth daily.    . Coenzyme Q10 (COQ-10) 100 MG CAPS Take 1 capsule by mouth daily.    . nitroGLYCERIN (NITROSTAT) 0.4 MG SL tablet Place 1 tablet (0.4 mg total) under the tongue every 5 (five) minutes as needed for  chest pain. 25 tablet 5  . Omega-3 Fatty Acids (FISH OIL) 1000 MG CAPS Take 1,000 mg by mouth 3 (three) times daily.    . rosuvastatin (CRESTOR) 5 MG tablet Take 2.5 mg by mouth every evening.     . TURMERIC PO Take 1 capsule by mouth daily.    . valsartan (DIOVAN) 160 MG tablet Take 80 mg by mouth daily.     No current facility-administered medications for this visit.     Allergies:   Food    Social History:  The patient  reports that he has never smoked. He has never used smokeless tobacco. He reports that he does not drink alcohol or use drugs.   Family History:  The patient's family history includes CAD in his father; CVA in his father; Hypertension in his father; Stroke in his mother and sister.    ROS:  Please see the history of present illness.   Otherwise, review of systems are positive for right  foot numbness.   All other systems are reviewed and negative.    PHYSICAL EXAM: VS:  BP (!) 146/70   Pulse 71   Ht 5\' 11"  (1.803 m)   Wt 184 lb 6.4 oz (83.6 kg)   SpO2 97%   BMI 25.72 kg/m  , BMI Body mass index is 25.72 kg/m. GEN: Well nourished, well developed, in no acute distress  HEENT: normal  Neck: no JVD, carotid bruits, or masses Cardiac: RRR; no murmurs, rubs, or gallops,no edema ; 2+ bilateral posterior tibial pulse, 2+ radial pulse bilaterally Respiratory:  clear to auscultation bilaterally, normal work of breathing GI: soft, nontender, nondistended, + BS MS: no deformity or atrophy  Skin: warm and dry, no rash Neuro:  Strength and sensation are intact Psych: euthymic mood, full affect   EKG:   The ekg ordered today demonstrates normal sinus rhythm, no significant ST segment changes, prolonged PR   Recent Labs: No results found for requested labs within last 8760 hours.   Lipid Panel No results found for: CHOL, TRIG, HDL, CHOLHDL, VLDL, LDLCALC, LDLDIRECT   Other studies Reviewed: Additional studies/ records that were reviewed today with results demonstrating: 2014 nuclear stress test reviewed: Normal left ventricuar function. No ischemia..   ASSESSMENT AND PLAN:  1. CAD:  No angina. Continue aggressive secondary prevention.  Doing well 14 years post CABG. 2. HTN: Blood pressure well controlled. Continue valsartan at the current dose. Lab work followed by Johnny Fox.  Check BP at home and if readings are higher, he should let us know.. 3. Hyperlipidemia: Continue lipid-lowering therapy.  LDL target less than 100 4. Thyroid nodule: Noted on 2014 carotid Doppler. It was 3.8 mm.  It has been watched.   5. Fatigue/DOE: This is likely multifactorial and a lot of this may be due to deconditioning. I have encouraged him strongly to walk 5 times a week 30 minutes a day. 6. Foot numbness: Sounds like neuropathy.   Current medicines are reviewed at length with the  patient today.  The patient concerns regarding his medicines were addressed.  The following changes have been made:  Decrease aspirin 81 mg daily  Labs/ tests ordered today include:   Orders Placed This Encounter  Procedures  . EKG 12-Lead    Recommend 150 minutes/week of aerobic exercise Low fat, low carb, high fiber diet recommended  Disposition:   FU in 1 year   Signed, Larae Grooms, MD  01/27/2016 11:01 AM    [facility]  [address] [place], [place]  [address] Phone: [phone]; Fax: [phone]

## 2016-01-27 ENCOUNTER — Ambulatory Visit (INDEPENDENT_AMBULATORY_CARE_PROVIDER_SITE_OTHER): Payer: Medicare Other | Admitting: Interventional Cardiology

## 2016-01-27 ENCOUNTER — Encounter: Payer: Self-pay | Admitting: Interventional Cardiology

## 2016-01-27 VITALS — BP 146/70 | HR 71 | Ht 71.0 in | Wt 184.4 lb

## 2016-01-27 DIAGNOSIS — I10 Essential (primary) hypertension: Secondary | ICD-10-CM

## 2016-01-27 DIAGNOSIS — R5382 Chronic fatigue, unspecified: Secondary | ICD-10-CM | POA: Diagnosis not present

## 2016-01-27 DIAGNOSIS — E041 Nontoxic single thyroid nodule: Secondary | ICD-10-CM

## 2016-01-27 DIAGNOSIS — I2510 Atherosclerotic heart disease of native coronary artery without angina pectoris: Secondary | ICD-10-CM | POA: Diagnosis not present

## 2016-01-27 DIAGNOSIS — E7800 Pure hypercholesterolemia, unspecified: Secondary | ICD-10-CM | POA: Diagnosis not present

## 2016-01-27 MED ORDER — NITROGLYCERIN 0.4 MG SL SUBL: 0.4000 mg | SUBLINGUAL_TABLET | SUBLINGUAL | 3 refills | Status: DC | PRN

## 2016-01-27 NOTE — Patient Instructions (Signed)

## 2016-02-03 DIAGNOSIS — C61 Malignant neoplasm of prostate: Secondary | ICD-10-CM | POA: Diagnosis not present

## 2016-02-10 DIAGNOSIS — R3915 Urgency of urination: Secondary | ICD-10-CM | POA: Diagnosis not present

## 2016-02-10 DIAGNOSIS — C61 Malignant neoplasm of prostate: Secondary | ICD-10-CM | POA: Diagnosis not present

## 2016-02-10 DIAGNOSIS — R3911 Hesitancy of micturition: Secondary | ICD-10-CM | POA: Diagnosis not present

## 2016-02-26 ENCOUNTER — Encounter: Payer: Self-pay | Admitting: Podiatry

## 2016-02-26 ENCOUNTER — Ambulatory Visit (INDEPENDENT_AMBULATORY_CARE_PROVIDER_SITE_OTHER): Payer: Medicare Other | Admitting: Podiatry

## 2016-02-26 VITALS — Ht 71.0 in | Wt 184.0 lb

## 2016-02-26 DIAGNOSIS — B351 Tinea unguium: Secondary | ICD-10-CM | POA: Diagnosis not present

## 2016-02-26 DIAGNOSIS — M79676 Pain in unspecified toe(s): Secondary | ICD-10-CM

## 2016-02-26 NOTE — Progress Notes (Signed)
Complaint:  Visit Type: Patient returns to my office for continued preventative foot care services. Complaint: Patient states" my nails have grown long and thick and become painful to walk and wear shoes" . The patient presents for preventative foot care services. No changes to ROS  Podiatric Exam: Vascular: dorsalis pedis and posterior tibial pulses are palpable bilateral. Capillary return is immediate. Temperature gradient is WNL. Skin turgor WNL  Sensorium: Normal Semmes Weinstein monofilament test. Normal tactile sensation bilaterally. Nail Exam: Pt has thick disfigured discolored nails with subungual debris noted bilateral entire nail hallux through fifth toenails Ulcer Exam: There is no evidence of ulcer or pre-ulcerative changes or infection. Orthopedic Exam: Muscle tone and strength are WNL. No limitations in general ROM. No crepitus or effusions noted. Foot type and digits show no abnormalities. Bony prominences are unremarkable. Skin: No Porokeratosis. No infection or ulcers  Diagnosis:  Onychomycosis, , Pain in right toe, pain in left toes  Treatment & Plan Procedures and Treatment: Consent by patient was obtained for treatment procedures. The patient understood the discussion of treatment and procedures well. All questions were answered thoroughly reviewed. Debridement of mycotic and hypertrophic toenails, 1 through 5 bilateral and clearing of subungual debris. No ulceration, no infection noted.  Return Visit-Office Procedure: Patient instructed to return to the office for a follow up visit 3 months   for continued evaluation and treatment.    Cherylee Rawlinson DPM 

## 2016-05-20 ENCOUNTER — Ambulatory Visit (INDEPENDENT_AMBULATORY_CARE_PROVIDER_SITE_OTHER): Payer: Medicare Other | Admitting: Podiatry

## 2016-05-20 ENCOUNTER — Encounter: Payer: Self-pay | Admitting: Podiatry

## 2016-05-20 DIAGNOSIS — B351 Tinea unguium: Secondary | ICD-10-CM | POA: Diagnosis not present

## 2016-05-20 DIAGNOSIS — M79676 Pain in unspecified toe(s): Secondary | ICD-10-CM | POA: Diagnosis not present

## 2016-05-20 NOTE — Progress Notes (Signed)
Complaint:  Visit Type: Patient returns to my office for continued preventative foot care services. Complaint: Patient states" my nails have grown long and thick and become painful to walk and wear shoes" . The patient presents for preventative foot care services. No changes to ROS  Podiatric Exam: Vascular: dorsalis pedis and posterior tibial pulses are palpable bilateral. Capillary return is immediate. Temperature gradient is WNL. Skin turgor WNL  Sensorium: Normal Semmes Weinstein monofilament test. Normal tactile sensation bilaterally. Nail Exam: Pt has thick disfigured discolored nails with subungual debris noted bilateral entire nail hallux through fifth toenails Ulcer Exam: There is no evidence of ulcer or pre-ulcerative changes or infection. Orthopedic Exam: Muscle tone and strength are WNL. No limitations in general ROM. No crepitus or effusions noted. Foot type and digits show no abnormalities. Bony prominences are unremarkable. Skin: No Porokeratosis. No infection or ulcers  Diagnosis:  Onychomycosis, , Pain in right toe, pain in left toes  Treatment & Plan Procedures and Treatment: Consent by patient was obtained for treatment procedures. The patient understood the discussion of treatment and procedures well. All questions were answered thoroughly reviewed. Debridement of mycotic and hypertrophic toenails, 1 through 5 bilateral and clearing of subungual debris. No ulceration, no infection noted.  Return Visit-Office Procedure: Patient instructed to return to the office for a follow up visit 3 months   for continued evaluation and treatment.    Lavon Bothwell DPM 

## 2016-07-02 DIAGNOSIS — M5137 Other intervertebral disc degeneration, lumbosacral region: Secondary | ICD-10-CM | POA: Diagnosis not present

## 2016-07-02 DIAGNOSIS — M545 Low back pain: Secondary | ICD-10-CM | POA: Diagnosis not present

## 2016-07-24 DIAGNOSIS — R42 Dizziness and giddiness: Secondary | ICD-10-CM | POA: Diagnosis not present

## 2016-07-24 DIAGNOSIS — R7989 Other specified abnormal findings of blood chemistry: Secondary | ICD-10-CM | POA: Diagnosis not present

## 2016-07-27 DIAGNOSIS — R7989 Other specified abnormal findings of blood chemistry: Secondary | ICD-10-CM | POA: Diagnosis not present

## 2016-07-29 DIAGNOSIS — I10 Essential (primary) hypertension: Secondary | ICD-10-CM | POA: Diagnosis not present

## 2016-07-29 DIAGNOSIS — R42 Dizziness and giddiness: Secondary | ICD-10-CM | POA: Diagnosis not present

## 2016-07-29 DIAGNOSIS — R7989 Other specified abnormal findings of blood chemistry: Secondary | ICD-10-CM | POA: Diagnosis not present

## 2016-07-30 DIAGNOSIS — D225 Melanocytic nevi of trunk: Secondary | ICD-10-CM | POA: Diagnosis not present

## 2016-07-30 DIAGNOSIS — D1801 Hemangioma of skin and subcutaneous tissue: Secondary | ICD-10-CM | POA: Diagnosis not present

## 2016-07-30 DIAGNOSIS — L821 Other seborrheic keratosis: Secondary | ICD-10-CM | POA: Diagnosis not present

## 2016-07-30 DIAGNOSIS — L570 Actinic keratosis: Secondary | ICD-10-CM | POA: Diagnosis not present

## 2016-07-30 DIAGNOSIS — L700 Acne vulgaris: Secondary | ICD-10-CM | POA: Diagnosis not present

## 2016-07-30 DIAGNOSIS — L814 Other melanin hyperpigmentation: Secondary | ICD-10-CM | POA: Diagnosis not present

## 2016-08-05 DIAGNOSIS — C61 Malignant neoplasm of prostate: Secondary | ICD-10-CM | POA: Diagnosis not present

## 2016-08-12 DIAGNOSIS — N401 Enlarged prostate with lower urinary tract symptoms: Secondary | ICD-10-CM | POA: Diagnosis not present

## 2016-08-12 DIAGNOSIS — R3915 Urgency of urination: Secondary | ICD-10-CM | POA: Diagnosis not present

## 2016-08-12 DIAGNOSIS — C61 Malignant neoplasm of prostate: Secondary | ICD-10-CM | POA: Diagnosis not present

## 2016-08-19 ENCOUNTER — Telehealth: Payer: Self-pay | Admitting: Podiatry

## 2016-08-19 ENCOUNTER — Ambulatory Visit (INDEPENDENT_AMBULATORY_CARE_PROVIDER_SITE_OTHER): Payer: Medicare Other | Admitting: Podiatry

## 2016-08-19 ENCOUNTER — Encounter: Payer: Self-pay | Admitting: Podiatry

## 2016-08-19 DIAGNOSIS — B351 Tinea unguium: Secondary | ICD-10-CM

## 2016-08-19 DIAGNOSIS — M79676 Pain in unspecified toe(s): Secondary | ICD-10-CM

## 2016-08-19 NOTE — Telephone Encounter (Signed)
Pt's wife called stating that pt got someone else's after visit summary and wanted to let us and Dr. Prudence Davidson know as well. Pt's wife asked that his after visit summary be mailed to him and that they would destroy the one they received on a different pt.

## 2016-08-19 NOTE — Progress Notes (Signed)
Complaint:  Visit Type: Patient returns to my office for continued preventative foot care services. Complaint: Patient states" my nails have grown long and thick and become painful to walk and wear shoes" . The patient presents for preventative foot care services. No changes to ROS  Podiatric Exam: Vascular: dorsalis pedis and posterior tibial pulses are palpable bilateral. Capillary return is immediate. Temperature gradient is WNL. Skin turgor WNL  Sensorium: Normal Semmes Weinstein monofilament test. Normal tactile sensation bilaterally. Nail Exam: Pt has thick disfigured discolored nails with subungual debris noted bilateral entire nail hallux through fifth toenails Ulcer Exam: There is no evidence of ulcer or pre-ulcerative changes or infection. Orthopedic Exam: Muscle tone and strength are WNL. No limitations in general ROM. No crepitus or effusions noted. Foot type and digits show no abnormalities. Bony prominences are unremarkable. Skin: No Porokeratosis. No infection or ulcers  Diagnosis:  Onychomycosis, , Pain in right toe, pain in left toes  Treatment & Plan Procedures and Treatment: Consent by patient was obtained for treatment procedures. The patient understood the discussion of treatment and procedures well. All questions were answered thoroughly reviewed. Debridement of mycotic and hypertrophic toenails, 1 through 5 bilateral and clearing of subungual debris. No ulceration, no infection noted.  Return Visit-Office Procedure: Patient instructed to return to the office for a follow up visit 3 months   for continued evaluation and treatment.    Ellary Casamento DPM 

## 2016-08-21 ENCOUNTER — Ambulatory Visit: Payer: Medicare Other | Admitting: Podiatry

## 2016-09-28 DIAGNOSIS — E1142 Type 2 diabetes mellitus with diabetic polyneuropathy: Secondary | ICD-10-CM | POA: Diagnosis not present

## 2016-09-28 DIAGNOSIS — I480 Paroxysmal atrial fibrillation: Secondary | ICD-10-CM | POA: Diagnosis not present

## 2016-09-28 DIAGNOSIS — I10 Essential (primary) hypertension: Secondary | ICD-10-CM | POA: Diagnosis not present

## 2016-09-28 DIAGNOSIS — C61 Malignant neoplasm of prostate: Secondary | ICD-10-CM | POA: Diagnosis not present

## 2016-09-28 DIAGNOSIS — E119 Type 2 diabetes mellitus without complications: Secondary | ICD-10-CM | POA: Diagnosis not present

## 2016-09-28 DIAGNOSIS — I2510 Atherosclerotic heart disease of native coronary artery without angina pectoris: Secondary | ICD-10-CM | POA: Diagnosis not present

## 2016-09-28 DIAGNOSIS — I25709 Atherosclerosis of coronary artery bypass graft(s), unspecified, with unspecified angina pectoris: Secondary | ICD-10-CM | POA: Diagnosis not present

## 2016-09-28 DIAGNOSIS — J40 Bronchitis, not specified as acute or chronic: Secondary | ICD-10-CM | POA: Diagnosis not present

## 2016-11-25 ENCOUNTER — Encounter: Payer: Self-pay | Admitting: Podiatry

## 2016-11-25 ENCOUNTER — Ambulatory Visit (INDEPENDENT_AMBULATORY_CARE_PROVIDER_SITE_OTHER): Payer: Medicare Other | Admitting: Podiatry

## 2016-11-25 DIAGNOSIS — M79676 Pain in unspecified toe(s): Secondary | ICD-10-CM

## 2016-11-25 DIAGNOSIS — B351 Tinea unguium: Secondary | ICD-10-CM

## 2016-11-25 NOTE — Progress Notes (Signed)
Complaint:  Visit Type: Patient returns to my office for continued preventative foot care services. Complaint: Patient states" my nails have grown long and thick and become painful to walk and wear shoes"  The patient presents for preventative foot care services. No changes to ROS  Podiatric Exam: Vascular: dorsalis pedis and posterior tibial pulses are palpable bilateral. Capillary return is immediate. Temperature gradient is WNL. Skin turgor WNL  Sensorium: Normal Semmes Weinstein monofilament test. Normal tactile sensation bilaterally. Nail Exam: Pt has thick disfigured discolored nails with subungual debris noted bilateral entire nail hallux through fifth toenails Ulcer Exam: There is no evidence of ulcer or pre-ulcerative changes or infection. Orthopedic Exam: Muscle tone and strength are WNL. No limitations in general ROM. No crepitus or effusions noted. Foot type and digits show no abnormalities. Bony prominences are unremarkable. Skin: No Porokeratosis. No infection or ulcers  Diagnosis:  Onychomycosis, , Pain in right toe, pain in left toes  Treatment & Plan Procedures and Treatment: Consent by patient was obtained for treatment procedures. The patient understood the discussion of treatment and procedures well. All questions were answered thoroughly reviewed. Debridement of mycotic and hypertrophic toenails, 1 through 5 bilateral and clearing of subungual debris. No ulceration, no infection noted. Removed nail spicule medial border right hallux.  Neosporin/DSD. Return Visit-Office Procedure: Patient instructed to return to the office for a follow up visit 3 months for continued evaluation and treatment.    Gardiner Barefoot DPM

## 2016-12-23 DIAGNOSIS — I2510 Atherosclerotic heart disease of native coronary artery without angina pectoris: Secondary | ICD-10-CM | POA: Diagnosis not present

## 2016-12-23 DIAGNOSIS — J40 Bronchitis, not specified as acute or chronic: Secondary | ICD-10-CM | POA: Diagnosis not present

## 2016-12-23 DIAGNOSIS — H9192 Unspecified hearing loss, left ear: Secondary | ICD-10-CM | POA: Diagnosis not present

## 2016-12-23 DIAGNOSIS — I480 Paroxysmal atrial fibrillation: Secondary | ICD-10-CM | POA: Diagnosis not present

## 2016-12-23 DIAGNOSIS — Z79899 Other long term (current) drug therapy: Secondary | ICD-10-CM | POA: Diagnosis not present

## 2016-12-23 DIAGNOSIS — E1142 Type 2 diabetes mellitus with diabetic polyneuropathy: Secondary | ICD-10-CM | POA: Diagnosis not present

## 2016-12-23 DIAGNOSIS — Z1389 Encounter for screening for other disorder: Secondary | ICD-10-CM | POA: Diagnosis not present

## 2016-12-23 DIAGNOSIS — I10 Essential (primary) hypertension: Secondary | ICD-10-CM | POA: Diagnosis not present

## 2016-12-23 DIAGNOSIS — I25709 Atherosclerosis of coronary artery bypass graft(s), unspecified, with unspecified angina pectoris: Secondary | ICD-10-CM | POA: Diagnosis not present

## 2016-12-23 DIAGNOSIS — Z0000 Encounter for general adult medical examination without abnormal findings: Secondary | ICD-10-CM | POA: Diagnosis not present

## 2016-12-23 DIAGNOSIS — H6123 Impacted cerumen, bilateral: Secondary | ICD-10-CM | POA: Diagnosis not present

## 2016-12-23 DIAGNOSIS — C61 Malignant neoplasm of prostate: Secondary | ICD-10-CM | POA: Diagnosis not present

## 2017-01-13 DIAGNOSIS — IMO0001 Reserved for inherently not codable concepts without codable children: Secondary | ICD-10-CM | POA: Insufficient documentation

## 2017-01-13 DIAGNOSIS — H9042 Sensorineural hearing loss, unilateral, left ear, with unrestricted hearing on the contralateral side: Secondary | ICD-10-CM | POA: Diagnosis not present

## 2017-01-13 DIAGNOSIS — H903 Sensorineural hearing loss, bilateral: Secondary | ICD-10-CM | POA: Diagnosis not present

## 2017-02-01 NOTE — Progress Notes (Signed)
Cardiology Office Note   Date:  02/03/2017   ID:  Johnny Fox, DOB 07/06/1928, MRN [id number]  PCP:  Johnny Huddle, MD    No chief complaint on file. CAD   Wt Readings from Last 3 Encounters:  02/03/17 177 lb 12.8 oz (80.6 kg)  02/26/16 184 lb (83.5 kg)  01/27/16 184 lb 6.4 oz (83.6 kg)       History of Present Illness: Johnny Fox is a 84 y.o. male  who has had CABG in 2004. Prior to CABG, he was not having many symptoms. He had a routine ECG and stress test showing CAD. He had only some DOE.   Exercise limited by foot pain.  He has neuropathic pain and tingling.  His balance is off as well.  He has fallen in the past.  He had a shoulder problem after a fall.  He fell once when it snowed.  He did not have any injuries.  He feels that his balance is off.  Denies : Chest pain. Dizziness. Leg edema. Nitroglycerin use. Orthopnea. Palpitations. Paroxysmal nocturnal dyspnea. Shortness of breath. Syncope.   He tries to play golf once a week.  He walks when the weather cooperates.  THey walk in stores sometimes as well for exercise.   He eats less and has lost weight.    Past Medical History:  Diagnosis Date  . Actinic keratoses    and seborrheic keratoses  . Arrhythmia    afib postoperatively after CABG in 2004, No recurrence  . Cancer [facility])    prostate   . Coronary artery disease   . Diabetes mellitus without complication (HCC)    diet controlled  . Dyslipidemia   . ED (erectile dysfunction)   . Elevated PSA    2010, workup with Dr. Risa Fox  . H/O prostate biopsy   . Hypertension   . Onychomycosis   . Prostate cancer ([place])    PSA's run approx 7- 2011-2013  . Right shoulder injury    july 2014, Dr. Justice Fox  . Tick bite of abdomen    2013, treated with 3wks of doxycycline, tick engorged 3.27mm nodule left thyroid 4/14- seen on carotid u/s    Past Surgical History:  Procedure Laterality Date  . CARDIAC SURGERY    . CORONARY ARTERY BYPASS  GRAFT  2004     Current Outpatient Medications  Medication Sig Dispense Refill  . aspirin EC 81 MG tablet Take 1 tablet (81 mg total) by mouth daily. 90 tablet 3  . cholecalciferol (VITAMIN D) 1000 UNITS tablet Take 1,000 Units by mouth daily.    . Coenzyme Q10 (COQ-10) 100 MG CAPS Take 1 capsule by mouth daily.    . nitroGLYCERIN (NITROSTAT) 0.4 MG SL tablet Place 1 tablet (0.4 mg total) under the tongue every 5 (five) minutes as needed for chest pain. 25 tablet 3  . Omega-3 Fatty Acids (FISH OIL) 1000 MG CAPS Take 1,000 mg by mouth 2 (two) times daily after a meal.     . rosuvastatin (CRESTOR) 5 MG tablet Take 2.5 mg by mouth every evening.     . TURMERIC PO Take 1 capsule by mouth 2 (two) times daily.     . valsartan (DIOVAN) 160 MG tablet Take 80 mg by mouth daily.     No current facility-administered medications for this visit.     Allergies:   Food    Social History:  The patient  reports that  has never smoked. he  has never used smokeless tobacco. He reports that he does not drink alcohol or use drugs.   Family History:  The patient's family history includes CAD in his father; CVA in his father; Hypertension in his father; Stroke in his mother and sister.    ROS:  Please see the history of present illness.   Otherwise, review of systems are positive for foot pain.   All other systems are reviewed and negative.    PHYSICAL EXAM: VS:  BP 132/78   Pulse 83   Ht 5\' 11"  (1.803 m)   Wt 177 lb 12.8 oz (80.6 kg)   SpO2 98%   BMI 24.80 kg/m  , BMI Body mass index is 24.8 kg/m. GEN: Well nourished, well developed, in no acute distress  HEENT: normal  Neck: no JVD, carotid bruits, or masses Cardiac: RRR; no murmurs, rubs, or gallops,no edema , palpable PT pulses bilaterally Respiratory:  clear to auscultation bilaterally, normal work of breathing GI: soft, nontender, nondistended, + BS MS: no deformity or atrophy  Skin: warm and dry, no rash Neuro:  Strength and sensation  are intact Psych: euthymic mood, full affect   EKG:   The ekg ordered today demonstrates NSR, no ST changes   Recent Labs: No results found for requested labs within last 8760 hours.   Lipid Panel No results found for: CHOL, TRIG, HDL, CHOLHDL, VLDL, LDLCALC, LDLDIRECT   Other studies Reviewed: Additional studies/ records that were reviewed today with results demonstrating: 12/18 labs: LDL 41, A1C 6.1, HDL 41.   ASSESSMENT AND PLAN:  1. CAD: 15 years post CABG.  No angina. Continue aggressive secondary prevention. 2. HTN: BP controlled.  Had some low BPs but this resolved. 3. Hyperlipidemia: Continue Crestor.  LDL well controlled.  4. Thyroid nodule:workup complete at this point.  Not being followed anymore.  TSH normal in 12/18 5. DOE: improved.  Balance is more of a problem.  COntinue to stay active to maintain stamina.  Neuropathic foot pain is biggest obstacle.    Current medicines are reviewed at length with the patient today.  The patient concerns regarding his medicines were addressed.  The following changes have been made:  No change  Labs/ tests ordered today include:  No orders of the defined types were placed in this encounter.   Recommend 150 minutes/week of aerobic exercise Low fat, low carb, high fiber diet recommended  Disposition:   FU in 1 year   Signed, Johnny Grooms, MD  02/03/2017 9:16 AM    [facility] [address], [place], [place]  [address] Phone: [phone]; Fax: [phone]

## 2017-02-03 ENCOUNTER — Encounter: Payer: Self-pay | Admitting: Interventional Cardiology

## 2017-02-03 ENCOUNTER — Encounter (INDEPENDENT_AMBULATORY_CARE_PROVIDER_SITE_OTHER): Payer: Self-pay

## 2017-02-03 ENCOUNTER — Ambulatory Visit (INDEPENDENT_AMBULATORY_CARE_PROVIDER_SITE_OTHER): Payer: Medicare Other | Admitting: Interventional Cardiology

## 2017-02-03 VITALS — BP 132/78 | HR 83 | Ht 71.0 in | Wt 177.8 lb

## 2017-02-03 DIAGNOSIS — M79672 Pain in left foot: Secondary | ICD-10-CM

## 2017-02-03 DIAGNOSIS — I2510 Atherosclerotic heart disease of native coronary artery without angina pectoris: Secondary | ICD-10-CM

## 2017-02-03 DIAGNOSIS — I10 Essential (primary) hypertension: Secondary | ICD-10-CM | POA: Diagnosis not present

## 2017-02-03 DIAGNOSIS — E7800 Pure hypercholesterolemia, unspecified: Secondary | ICD-10-CM

## 2017-02-03 DIAGNOSIS — M79671 Pain in right foot: Secondary | ICD-10-CM

## 2017-02-03 NOTE — Patient Instructions (Signed)

## 2017-02-08 DIAGNOSIS — C61 Malignant neoplasm of prostate: Secondary | ICD-10-CM | POA: Diagnosis not present

## 2017-02-12 DIAGNOSIS — R350 Frequency of micturition: Secondary | ICD-10-CM | POA: Diagnosis not present

## 2017-02-12 DIAGNOSIS — C61 Malignant neoplasm of prostate: Secondary | ICD-10-CM | POA: Diagnosis not present

## 2017-02-12 DIAGNOSIS — R351 Nocturia: Secondary | ICD-10-CM | POA: Diagnosis not present

## 2017-02-17 DIAGNOSIS — Z961 Presence of intraocular lens: Secondary | ICD-10-CM | POA: Diagnosis not present

## 2017-02-17 DIAGNOSIS — H5203 Hypermetropia, bilateral: Secondary | ICD-10-CM | POA: Diagnosis not present

## 2017-02-17 DIAGNOSIS — H52223 Regular astigmatism, bilateral: Secondary | ICD-10-CM | POA: Diagnosis not present

## 2017-02-17 DIAGNOSIS — H26491 Other secondary cataract, right eye: Secondary | ICD-10-CM | POA: Diagnosis not present

## 2017-02-17 DIAGNOSIS — H353121 Nonexudative age-related macular degeneration, left eye, early dry stage: Secondary | ICD-10-CM | POA: Diagnosis not present

## 2017-02-17 DIAGNOSIS — H353111 Nonexudative age-related macular degeneration, right eye, early dry stage: Secondary | ICD-10-CM | POA: Diagnosis not present

## 2017-02-17 DIAGNOSIS — H43813 Vitreous degeneration, bilateral: Secondary | ICD-10-CM | POA: Diagnosis not present

## 2017-02-24 ENCOUNTER — Ambulatory Visit (INDEPENDENT_AMBULATORY_CARE_PROVIDER_SITE_OTHER): Payer: Medicare Other | Admitting: Podiatry

## 2017-02-24 ENCOUNTER — Encounter: Payer: Self-pay | Admitting: Podiatry

## 2017-02-24 DIAGNOSIS — B351 Tinea unguium: Secondary | ICD-10-CM

## 2017-02-24 DIAGNOSIS — M79676 Pain in unspecified toe(s): Secondary | ICD-10-CM | POA: Diagnosis not present

## 2017-02-24 NOTE — Progress Notes (Addendum)
Complaint:  Visit Type: Patient returns to my office for continued preventative foot care services. Complaint: Patient states" my nails have grown long and thick and become painful to walk and wear shoes"  The patient presents for preventative foot care services. No changes to ROS  Podiatric Exam: Vascular: dorsalis pedis and posterior tibial pulses are palpable bilateral. Capillary return is immediate. Temperature gradient is WNL. Skin turgor WNL  Sensorium: Normal Semmes Weinstein monofilament test. Normal tactile sensation bilaterally. Nail Exam: Pt has thick disfigured discolored nails with subungual debris noted bilateral entire nail hallux through fifth toenails Ulcer Exam: There is no evidence of ulcer or pre-ulcerative changes or infection. Orthopedic Exam: Muscle tone and strength are WNL. No limitations in general ROM. No crepitus or effusions noted. Foot type and digits show no abnormalities. Bony prominences are unremarkable. Skin: No Porokeratosis. No infection or ulcers  Diagnosis:  Onychomycosis, , Pain in right toe, pain in left toes  Treatment & Plan Procedures and Treatment: Consent by patient was obtained for treatment procedures. The patient understood the discussion of treatment and procedures well. All questions were answered thoroughly reviewed. Debridement of mycotic and hypertrophic toenails, 1 through 5 bilateral and clearing of subungual debris. No ulceration, no infection noted. ABN signed for 2019. Return Visit-Office Procedure: Patient instructed to return to the office for a follow up visit 3 months for continued evaluation and treatment.    Oline Belk DPM 

## 2017-03-17 DIAGNOSIS — C61 Malignant neoplasm of prostate: Secondary | ICD-10-CM | POA: Diagnosis not present

## 2017-03-17 DIAGNOSIS — I25709 Atherosclerosis of coronary artery bypass graft(s), unspecified, with unspecified angina pectoris: Secondary | ICD-10-CM | POA: Diagnosis not present

## 2017-03-17 DIAGNOSIS — I2510 Atherosclerotic heart disease of native coronary artery without angina pectoris: Secondary | ICD-10-CM | POA: Diagnosis not present

## 2017-03-17 DIAGNOSIS — E1142 Type 2 diabetes mellitus with diabetic polyneuropathy: Secondary | ICD-10-CM | POA: Diagnosis not present

## 2017-03-17 DIAGNOSIS — I480 Paroxysmal atrial fibrillation: Secondary | ICD-10-CM | POA: Diagnosis not present

## 2017-03-17 DIAGNOSIS — J40 Bronchitis, not specified as acute or chronic: Secondary | ICD-10-CM | POA: Diagnosis not present

## 2017-03-17 DIAGNOSIS — I10 Essential (primary) hypertension: Secondary | ICD-10-CM | POA: Diagnosis not present

## 2017-03-17 DIAGNOSIS — Z8546 Personal history of malignant neoplasm of prostate: Secondary | ICD-10-CM | POA: Diagnosis not present

## 2017-03-18 DIAGNOSIS — R05 Cough: Secondary | ICD-10-CM | POA: Diagnosis not present

## 2017-03-18 DIAGNOSIS — J069 Acute upper respiratory infection, unspecified: Secondary | ICD-10-CM | POA: Diagnosis not present

## 2017-05-03 DIAGNOSIS — N4889 Other specified disorders of penis: Secondary | ICD-10-CM | POA: Diagnosis not present

## 2017-05-06 DIAGNOSIS — N481 Balanitis: Secondary | ICD-10-CM | POA: Diagnosis not present

## 2017-05-19 DIAGNOSIS — L819 Disorder of pigmentation, unspecified: Secondary | ICD-10-CM | POA: Diagnosis not present

## 2017-05-19 DIAGNOSIS — L821 Other seborrheic keratosis: Secondary | ICD-10-CM | POA: Diagnosis not present

## 2017-05-19 DIAGNOSIS — L570 Actinic keratosis: Secondary | ICD-10-CM | POA: Diagnosis not present

## 2017-05-19 DIAGNOSIS — D1801 Hemangioma of skin and subcutaneous tissue: Secondary | ICD-10-CM | POA: Diagnosis not present

## 2017-05-19 DIAGNOSIS — L814 Other melanin hyperpigmentation: Secondary | ICD-10-CM | POA: Diagnosis not present

## 2017-05-19 DIAGNOSIS — D225 Melanocytic nevi of trunk: Secondary | ICD-10-CM | POA: Diagnosis not present

## 2017-05-26 ENCOUNTER — Ambulatory Visit (INDEPENDENT_AMBULATORY_CARE_PROVIDER_SITE_OTHER): Payer: Medicare Other | Admitting: Podiatry

## 2017-05-26 ENCOUNTER — Encounter: Payer: Self-pay | Admitting: Podiatry

## 2017-05-26 DIAGNOSIS — B351 Tinea unguium: Secondary | ICD-10-CM

## 2017-05-26 DIAGNOSIS — M79676 Pain in unspecified toe(s): Secondary | ICD-10-CM | POA: Diagnosis not present

## 2017-05-26 NOTE — Progress Notes (Signed)
Complaint:  Visit Type: Patient returns to my office for continued preventative foot care services. Complaint: Patient states" my nails have grown long and thick and become painful to walk and wear shoes"  The patient presents for preventative foot care services. No changes to ROS  Podiatric Exam: Vascular: dorsalis pedis and posterior tibial pulses are palpable bilateral. Capillary return is immediate. Temperature gradient is WNL. Skin turgor WNL  Sensorium: Normal Semmes Weinstein monofilament test. Normal tactile sensation bilaterally. Nail Exam: Pt has thick disfigured discolored nails with subungual debris noted bilateral entire nail hallux through fifth toenails Ulcer Exam: There is no evidence of ulcer or pre-ulcerative changes or infection. Orthopedic Exam: Muscle tone and strength are WNL. No limitations in general ROM. No crepitus or effusions noted. Foot type and digits show no abnormalities. Bony prominences are unremarkable. Skin: No Porokeratosis. No infection or ulcers  Diagnosis:  Onychomycosis, , Pain in right toe, pain in left toes  Treatment & Plan Procedures and Treatment: Consent by patient was obtained for treatment procedures. The patient understood the discussion of treatment and procedures well. All questions were answered thoroughly reviewed. Debridement of mycotic and hypertrophic toenails, 1 through 5 bilateral and clearing of subungual debris. No ulceration, no infection noted. ABN signed for 2019. Return Visit-Office Procedure: Patient instructed to return to the office for a follow up visit 3 months for continued evaluation and treatment.    Earsie Humm DPM 

## 2017-06-10 DIAGNOSIS — N481 Balanitis: Secondary | ICD-10-CM | POA: Diagnosis not present

## 2017-06-10 DIAGNOSIS — C61 Malignant neoplasm of prostate: Secondary | ICD-10-CM | POA: Diagnosis not present

## 2017-08-13 DIAGNOSIS — R42 Dizziness and giddiness: Secondary | ICD-10-CM | POA: Diagnosis not present

## 2017-08-18 DIAGNOSIS — R252 Cramp and spasm: Secondary | ICD-10-CM | POA: Diagnosis not present

## 2017-08-18 DIAGNOSIS — I10 Essential (primary) hypertension: Secondary | ICD-10-CM | POA: Diagnosis not present

## 2017-08-18 DIAGNOSIS — R42 Dizziness and giddiness: Secondary | ICD-10-CM | POA: Diagnosis not present

## 2017-08-19 DIAGNOSIS — C44629 Squamous cell carcinoma of skin of left upper limb, including shoulder: Secondary | ICD-10-CM | POA: Diagnosis not present

## 2017-08-19 DIAGNOSIS — D485 Neoplasm of uncertain behavior of skin: Secondary | ICD-10-CM | POA: Diagnosis not present

## 2017-08-19 DIAGNOSIS — L570 Actinic keratosis: Secondary | ICD-10-CM | POA: Diagnosis not present

## 2017-08-25 ENCOUNTER — Encounter: Payer: Self-pay | Admitting: Podiatry

## 2017-08-25 ENCOUNTER — Ambulatory Visit (INDEPENDENT_AMBULATORY_CARE_PROVIDER_SITE_OTHER): Payer: Medicare Other | Admitting: Podiatry

## 2017-08-25 DIAGNOSIS — M79676 Pain in unspecified toe(s): Secondary | ICD-10-CM

## 2017-08-25 DIAGNOSIS — B351 Tinea unguium: Secondary | ICD-10-CM | POA: Diagnosis not present

## 2017-08-25 NOTE — Progress Notes (Signed)
Complaint:  Visit Type: Patient returns to my office for continued preventative foot care services. Complaint: Patient states" my nails have grown long and thick and become painful to walk and wear shoes"  The patient presents for preventative foot care services. No changes to ROS  Podiatric Exam: Vascular: dorsalis pedis and posterior tibial pulses are palpable bilateral. Capillary return is immediate. Temperature gradient is WNL. Skin turgor WNL  Sensorium: Normal Semmes Weinstein monofilament test. Normal tactile sensation bilaterally. Nail Exam: Pt has thick disfigured discolored nails with subungual debris noted bilateral entire nail hallux through fifth toenails Ulcer Exam: There is no evidence of ulcer or pre-ulcerative changes or infection. Orthopedic Exam: Muscle tone and strength are WNL. No limitations in general ROM. No crepitus or effusions noted. Foot type and digits show no abnormalities. Bony prominences are unremarkable. Skin: No Porokeratosis. No infection or ulcers  Diagnosis:  Onychomycosis, , Pain in right toe, pain in left toes  Treatment & Plan Procedures and Treatment: Consent by patient was obtained for treatment procedures. The patient understood the discussion of treatment and procedures well. All questions were answered thoroughly reviewed. Debridement of mycotic and hypertrophic toenails, 1 through 5 bilateral and clearing of subungual debris. No ulceration, no infection noted. Debrided nail spicule medial border right great toe.   ABN signed for 2019. Return Visit-Office Procedure: Patient instructed to return to the office for a follow up visit 3 months for continued evaluation and treatment.    Gardiner Barefoot DPM

## 2017-08-26 DIAGNOSIS — L570 Actinic keratosis: Secondary | ICD-10-CM | POA: Diagnosis not present

## 2017-09-15 DIAGNOSIS — Z23 Encounter for immunization: Secondary | ICD-10-CM | POA: Diagnosis not present

## 2017-10-06 DIAGNOSIS — L570 Actinic keratosis: Secondary | ICD-10-CM | POA: Diagnosis not present

## 2017-11-19 DIAGNOSIS — L570 Actinic keratosis: Secondary | ICD-10-CM | POA: Diagnosis not present

## 2017-11-19 DIAGNOSIS — Z85828 Personal history of other malignant neoplasm of skin: Secondary | ICD-10-CM | POA: Diagnosis not present

## 2017-11-19 DIAGNOSIS — L905 Scar conditions and fibrosis of skin: Secondary | ICD-10-CM | POA: Diagnosis not present

## 2017-11-24 ENCOUNTER — Ambulatory Visit (INDEPENDENT_AMBULATORY_CARE_PROVIDER_SITE_OTHER): Payer: Medicare Other | Admitting: Podiatry

## 2017-11-24 ENCOUNTER — Encounter: Payer: Self-pay | Admitting: Podiatry

## 2017-11-24 DIAGNOSIS — M79676 Pain in unspecified toe(s): Secondary | ICD-10-CM

## 2017-11-24 DIAGNOSIS — B351 Tinea unguium: Secondary | ICD-10-CM

## 2017-11-24 NOTE — Progress Notes (Signed)
Complaint:  Visit Type: Patient returns to my office for continued preventative foot care services. Complaint: Patient states" my nails have grown long and thick and become painful to walk and wear shoes"  The patient presents for preventative foot care services. No changes to ROS  Podiatric Exam: Vascular: dorsalis pedis and posterior tibial pulses are palpable bilateral. Capillary return is immediate. Temperature gradient is WNL. Skin turgor WNL  Sensorium: Normal Semmes Weinstein monofilament test. Normal tactile sensation bilaterally. Nail Exam: Pt has thick disfigured discolored nails with subungual debris noted bilateral entire nail hallux through fifth toenails Ulcer Exam: There is no evidence of ulcer or pre-ulcerative changes or infection. Orthopedic Exam: Muscle tone and strength are WNL. No limitations in general ROM. No crepitus or effusions noted. Foot type and digits show no abnormalities. Bony prominences are unremarkable. Skin: No Porokeratosis. No infection or ulcers  Diagnosis:  Onychomycosis, , Pain in right toe, pain in left toes  Treatment & Plan Procedures and Treatment: Consent by patient was obtained for treatment procedures. The patient understood the discussion of treatment and procedures well. All questions were answered thoroughly reviewed. Debridement of mycotic and hypertrophic toenails, 1 through 5 bilateral and clearing of subungual debris. No ulceration, no infection noted. ABN signed for 2019. Return Visit-Office Procedure: Patient instructed to return to the office for a follow up visit 3 months for continued evaluation and treatment.    Sujata Maines DPM 

## 2017-12-01 DIAGNOSIS — C61 Malignant neoplasm of prostate: Secondary | ICD-10-CM | POA: Diagnosis not present

## 2017-12-16 DIAGNOSIS — R351 Nocturia: Secondary | ICD-10-CM | POA: Diagnosis not present

## 2017-12-16 DIAGNOSIS — C61 Malignant neoplasm of prostate: Secondary | ICD-10-CM | POA: Diagnosis not present

## 2017-12-16 DIAGNOSIS — R3915 Urgency of urination: Secondary | ICD-10-CM | POA: Diagnosis not present

## 2017-12-16 DIAGNOSIS — R350 Frequency of micturition: Secondary | ICD-10-CM | POA: Diagnosis not present

## 2017-12-26 DIAGNOSIS — J069 Acute upper respiratory infection, unspecified: Secondary | ICD-10-CM | POA: Diagnosis not present

## 2018-01-14 DIAGNOSIS — Z1389 Encounter for screening for other disorder: Secondary | ICD-10-CM | POA: Diagnosis not present

## 2018-01-14 DIAGNOSIS — Z0000 Encounter for general adult medical examination without abnormal findings: Secondary | ICD-10-CM | POA: Diagnosis not present

## 2018-01-17 DIAGNOSIS — I10 Essential (primary) hypertension: Secondary | ICD-10-CM | POA: Diagnosis not present

## 2018-01-17 DIAGNOSIS — E119 Type 2 diabetes mellitus without complications: Secondary | ICD-10-CM | POA: Diagnosis not present

## 2018-01-17 DIAGNOSIS — I480 Paroxysmal atrial fibrillation: Secondary | ICD-10-CM | POA: Diagnosis not present

## 2018-01-17 DIAGNOSIS — C61 Malignant neoplasm of prostate: Secondary | ICD-10-CM | POA: Diagnosis not present

## 2018-01-17 DIAGNOSIS — J40 Bronchitis, not specified as acute or chronic: Secondary | ICD-10-CM | POA: Diagnosis not present

## 2018-01-17 DIAGNOSIS — I2510 Atherosclerotic heart disease of native coronary artery without angina pectoris: Secondary | ICD-10-CM | POA: Diagnosis not present

## 2018-01-17 DIAGNOSIS — I25709 Atherosclerosis of coronary artery bypass graft(s), unspecified, with unspecified angina pectoris: Secondary | ICD-10-CM | POA: Diagnosis not present

## 2018-01-17 DIAGNOSIS — Z8546 Personal history of malignant neoplasm of prostate: Secondary | ICD-10-CM | POA: Diagnosis not present

## 2018-01-17 DIAGNOSIS — E1142 Type 2 diabetes mellitus with diabetic polyneuropathy: Secondary | ICD-10-CM | POA: Diagnosis not present

## 2018-01-19 DIAGNOSIS — R200 Anesthesia of skin: Secondary | ICD-10-CM | POA: Diagnosis not present

## 2018-01-19 DIAGNOSIS — Z8546 Personal history of malignant neoplasm of prostate: Secondary | ICD-10-CM | POA: Diagnosis not present

## 2018-01-19 DIAGNOSIS — E1142 Type 2 diabetes mellitus with diabetic polyneuropathy: Secondary | ICD-10-CM | POA: Diagnosis not present

## 2018-01-19 DIAGNOSIS — I2510 Atherosclerotic heart disease of native coronary artery without angina pectoris: Secondary | ICD-10-CM | POA: Diagnosis not present

## 2018-01-19 DIAGNOSIS — E119 Type 2 diabetes mellitus without complications: Secondary | ICD-10-CM | POA: Diagnosis not present

## 2018-01-19 DIAGNOSIS — R42 Dizziness and giddiness: Secondary | ICD-10-CM | POA: Diagnosis not present

## 2018-01-19 DIAGNOSIS — E7800 Pure hypercholesterolemia, unspecified: Secondary | ICD-10-CM | POA: Diagnosis not present

## 2018-01-19 DIAGNOSIS — G629 Polyneuropathy, unspecified: Secondary | ICD-10-CM | POA: Diagnosis not present

## 2018-01-19 DIAGNOSIS — I10 Essential (primary) hypertension: Secondary | ICD-10-CM | POA: Diagnosis not present

## 2018-01-25 ENCOUNTER — Encounter: Payer: Self-pay | Admitting: Interventional Cardiology

## 2018-02-08 NOTE — Progress Notes (Signed)
Cardiology Office Note   Date:  02/10/2018   ID:  Johnny Fox, DOB 1928-01-26, MRN [id number]  PCP:  Josetta Huddle, MD    No chief complaint on file.  CAD  Wt Readings from Last 3 Encounters:  02/10/18 177 lb 12.8 oz (80.6 kg)  02/03/17 177 lb 12.8 oz (80.6 kg)  02/26/16 184 lb (83.5 kg)       History of Present Illness: Johnny Fox is a 85 y.o. male  who has had CABG in 2004. Prior to CABG, he was not having many symptoms. He had a routine ECG and stress test showing CAD. He had only some DOE.   Exercise limited by foot pain. He has neuropathic pain and tingling. His balance is off as well. He has fallen in the past. He had a shoulder problem after a fall.  Since the last visit, he hurt his right arm playing golf.  He has a balance problem.  He has not fallen recently.    Noted some bruising and swelling at his right bicep.    Denies :  Dizziness. Leg edema. Orthopnea. Palpitations. Paroxysmal nocturnal dyspnea. Shortness of breath. Syncope.   NTG used once with resolution of sx; occurred many months ago.  No problems while walking or playing golf.    Past Medical History:  Diagnosis Date  . Actinic keratoses    and seborrheic keratoses  . Arrhythmia    afib postoperatively after CABG in 2004, No recurrence  . Cancer [facility])    prostate   . Coronary artery disease   . Diabetes mellitus without complication (HCC)    diet controlled  . Dyslipidemia   . ED (erectile dysfunction)   . Elevated PSA    2010, workup with Dr. Risa Grill  . H/O prostate biopsy   . Hypertension   . Onychomycosis   . Prostate cancer ([place])    PSA's run approx 7- 2011-2013  . Right shoulder injury    july 2014, Dr. Justice Britain  . Tick bite of abdomen    2013, treated with 3wks of doxycycline, tick engorged 3.23mm nodule left thyroid 4/14- seen on carotid u/s    Past Surgical History:  Procedure Laterality Date  . CARDIAC SURGERY    . CORONARY ARTERY BYPASS GRAFT   2004     Current Outpatient Medications  Medication Sig Dispense Refill  . aspirin EC 81 MG tablet Take 1 tablet (81 mg total) by mouth daily. 90 tablet 3  . cholecalciferol (VITAMIN D) 1000 UNITS tablet Take 1,000 Units by mouth daily.    . Coenzyme Q10 (COQ-10) 100 MG CAPS Take 1 capsule by mouth daily.    . nitroGLYCERIN (NITROSTAT) 0.4 MG SL tablet Place 1 tablet (0.4 mg total) under the tongue every 5 (five) minutes as needed for chest pain. 25 tablet 3  . Omega-3 Fatty Acids (FISH OIL) 1000 MG CAPS Take 1,000 mg by mouth 2 (two) times daily after a meal.     . rosuvastatin (CRESTOR) 5 MG tablet Take 2.5 mg by mouth every evening. Take one tablet by mouth Monday through Friday.    . TURMERIC PO Take 1 capsule by mouth 2 (two) times daily.     . valsartan (DIOVAN) 80 MG tablet Take 80 mg by mouth 3 (three) times a week. Take one tablet by mouth Monday, Wednesday, and Friday.     No current facility-administered medications for this visit.     Allergies:   Food  Social History:  The patient  reports that he has never smoked. He has never used smokeless tobacco. He reports that he does not drink alcohol or use drugs.   Family History:  The patient's family history includes CAD in his father; CVA in his father; Hypertension in his father; Stroke in his mother and sister.    ROS:  Please see the history of present illness.   Otherwise, review of systems are positive for right biceps pain and bruising.   All other systems are reviewed and negative.    PHYSICAL EXAM: VS:  BP 126/68   Pulse 85   Ht 5\' 11"  (1.803 m)   Wt 177 lb 12.8 oz (80.6 kg)   SpO2 99%   BMI 24.80 kg/m  , BMI Body mass index is 24.8 kg/m. GEN: Well nourished, well developed, in no acute distress  HEENT: normal  Neck: no JVD, carotid bruits, or masses Cardiac: RRR; no murmurs, rubs, or gallops,no edema  Respiratory:  clear to auscultation bilaterally, normal work of breathing GI: soft, nontender,  nondistended, + BS MS: no  atrophy ; swollen right biceps with bruising Skin: warm and dry, no rash Neuro:  Strength and sensation are intact Psych: euthymic mood, full affect   EKG:   The ekg ordered today demonstrates NSR, intermittent RBBB which is new, no ST changes   Recent Labs: No results found for requested labs within last 8760 hours.   Lipid Panel No results found for: CHOL, TRIG, HDL, CHOLHDL, VLDL, LDLCALC, LDLDIRECT   Other studies Reviewed: Additional studies/ records that were reviewed today with results demonstrating: LDL 53 in Jan 2020.   ASSESSMENT AND PLAN:  1. CAD: No clear angina. Sx controlled with medicines. Continue aggressive secondary prevention.  Refill NTG.  2. HTN: The current medical regimen is effective;  continue present plan and medications. 3. Hyperlipidemia: The current medical regimen is effective;  continue present plan and medications. 4. Thyroid nodule: Diagnosed many years ago. Was not confirmed at a later time.  5. DOE: Stable.  Continue exercise.  We discussed using the "talk test" to gauge level of exercise.  6. Likely biceps tendon rupture on the right as a cause of his swelling.  He has f/u with ortho.   Current medicines are reviewed at length with the patient today.  The patient concerns regarding his medicines were addressed.  The following changes have been made:  No change  Labs/ tests ordered today include:  No orders of the defined types were placed in this encounter.   Recommend 150 minutes/week of aerobic exercise Low fat, low carb, high fiber diet recommended  Disposition:   FU in 1 year   Signed, Larae Grooms, MD  02/10/2018 9:24 AM    [facility] [place], [place], [place]  [address] Phone: [phone]; Fax: [phone]

## 2018-02-10 ENCOUNTER — Ambulatory Visit (INDEPENDENT_AMBULATORY_CARE_PROVIDER_SITE_OTHER): Payer: Medicare Other | Admitting: Interventional Cardiology

## 2018-02-10 ENCOUNTER — Encounter: Payer: Self-pay | Admitting: Interventional Cardiology

## 2018-02-10 ENCOUNTER — Encounter (INDEPENDENT_AMBULATORY_CARE_PROVIDER_SITE_OTHER): Payer: Self-pay

## 2018-02-10 VITALS — BP 126/68 | HR 85 | Ht 71.0 in | Wt 177.8 lb

## 2018-02-10 DIAGNOSIS — I10 Essential (primary) hypertension: Secondary | ICD-10-CM

## 2018-02-10 DIAGNOSIS — S46211A Strain of muscle, fascia and tendon of other parts of biceps, right arm, initial encounter: Secondary | ICD-10-CM | POA: Diagnosis not present

## 2018-02-10 DIAGNOSIS — E7800 Pure hypercholesterolemia, unspecified: Secondary | ICD-10-CM

## 2018-02-10 DIAGNOSIS — I2510 Atherosclerotic heart disease of native coronary artery without angina pectoris: Secondary | ICD-10-CM | POA: Diagnosis not present

## 2018-02-10 MED ORDER — NITROGLYCERIN 0.4 MG SL SUBL: 0.4000 mg | SUBLINGUAL_TABLET | SUBLINGUAL | 3 refills | Status: DC | PRN

## 2018-02-10 NOTE — Patient Instructions (Signed)

## 2018-02-14 DIAGNOSIS — M62121 Other rupture of muscle (nontraumatic), right upper arm: Secondary | ICD-10-CM | POA: Diagnosis not present

## 2018-02-14 DIAGNOSIS — M25511 Pain in right shoulder: Secondary | ICD-10-CM | POA: Diagnosis not present

## 2018-02-25 ENCOUNTER — Ambulatory Visit (INDEPENDENT_AMBULATORY_CARE_PROVIDER_SITE_OTHER): Payer: Medicare Other | Admitting: Podiatry

## 2018-02-25 ENCOUNTER — Encounter: Payer: Self-pay | Admitting: Podiatry

## 2018-02-25 DIAGNOSIS — M79676 Pain in unspecified toe(s): Secondary | ICD-10-CM

## 2018-02-25 DIAGNOSIS — B351 Tinea unguium: Secondary | ICD-10-CM

## 2018-02-25 NOTE — Progress Notes (Signed)
Complaint:  Visit Type: Patient returns to my office for continued preventative foot care services. Complaint: Patient states" my nails have grown long and thick and become painful to walk and wear shoes" . The patient presents for preventative foot care services. No changes to ROS  Podiatric Exam: Vascular: dorsalis pedis and posterior tibial pulses are palpable bilateral. Capillary return is immediate. Temperature gradient is WNL. Skin turgor WNL  Sensorium: Normal Semmes Weinstein monofilament test. Normal tactile sensation bilaterally. Nail Exam: Pt has thick disfigured discolored nails with subungual debris noted bilateral entire nail hallux through fifth toenails Ulcer Exam: There is no evidence of ulcer or pre-ulcerative changes or infection. Orthopedic Exam: Muscle tone and strength are WNL. No limitations in general ROM. No crepitus or effusions noted. Foot type and digits show no abnormalities. Bony prominences are unremarkable. Skin: No Porokeratosis. No infection or ulcers  Diagnosis:  Onychomycosis, , Pain in right toe, pain in left toes  Treatment & Plan Procedures and Treatment: Consent by patient was obtained for treatment procedures. The patient understood the discussion of treatment and procedures well. All questions were answered thoroughly reviewed. Debridement of mycotic and hypertrophic toenails, 1 through 5 bilateral and clearing of subungual debris. No ulceration, no infection noted.  Return Visit-Office Procedure: Patient instructed to return to the office for a follow up visit 3 months   for continued evaluation and treatment.    Romeo Zielinski DPM 

## 2018-03-02 DIAGNOSIS — H52223 Regular astigmatism, bilateral: Secondary | ICD-10-CM | POA: Diagnosis not present

## 2018-03-02 DIAGNOSIS — H33303 Unspecified retinal break, bilateral: Secondary | ICD-10-CM | POA: Diagnosis not present

## 2018-03-02 DIAGNOSIS — H5051 Esophoria: Secondary | ICD-10-CM | POA: Diagnosis not present

## 2018-03-02 DIAGNOSIS — H5203 Hypermetropia, bilateral: Secondary | ICD-10-CM | POA: Diagnosis not present

## 2018-03-02 DIAGNOSIS — Z961 Presence of intraocular lens: Secondary | ICD-10-CM | POA: Diagnosis not present

## 2018-03-02 DIAGNOSIS — H3540 Unspecified peripheral retinal degeneration: Secondary | ICD-10-CM | POA: Diagnosis not present

## 2018-03-02 DIAGNOSIS — H43813 Vitreous degeneration, bilateral: Secondary | ICD-10-CM | POA: Diagnosis not present

## 2018-03-04 DIAGNOSIS — M62121 Other rupture of muscle (nontraumatic), right upper arm: Secondary | ICD-10-CM | POA: Diagnosis not present

## 2018-03-04 DIAGNOSIS — M19111 Post-traumatic osteoarthritis, right shoulder: Secondary | ICD-10-CM | POA: Diagnosis not present

## 2018-03-04 DIAGNOSIS — M62129 Other rupture of muscle (nontraumatic), unspecified upper arm: Secondary | ICD-10-CM | POA: Diagnosis not present

## 2018-05-27 ENCOUNTER — Ambulatory Visit: Payer: Medicare Other | Admitting: Podiatry

## 2018-06-01 ENCOUNTER — Ambulatory Visit (INDEPENDENT_AMBULATORY_CARE_PROVIDER_SITE_OTHER): Payer: Medicare Other | Admitting: Podiatry

## 2018-06-01 ENCOUNTER — Encounter: Payer: Self-pay | Admitting: Podiatry

## 2018-06-01 ENCOUNTER — Other Ambulatory Visit: Payer: Self-pay

## 2018-06-01 VITALS — Temp 97.3°F

## 2018-06-01 DIAGNOSIS — M79676 Pain in unspecified toe(s): Secondary | ICD-10-CM

## 2018-06-01 DIAGNOSIS — B351 Tinea unguium: Secondary | ICD-10-CM | POA: Diagnosis not present

## 2018-06-01 NOTE — Progress Notes (Signed)
Complaint:  Visit Type: Patient returns to my office for continued preventative foot care services. Complaint: Patient states" my nails have grown long and thick and become painful to walk and wear shoes" . The patient presents for preventative foot care services. No changes to ROS  Podiatric Exam: Vascular: dorsalis pedis and posterior tibial pulses are palpable bilateral. Capillary return is immediate. Temperature gradient is WNL. Skin turgor WNL  Sensorium: Normal Semmes Weinstein monofilament test. Normal tactile sensation bilaterally. Nail Exam: Pt has thick disfigured discolored nails with subungual debris noted bilateral entire nail hallux through fifth toenails Ulcer Exam: There is no evidence of ulcer or pre-ulcerative changes or infection. Orthopedic Exam: Muscle tone and strength are WNL. No limitations in general ROM. No crepitus or effusions noted. Foot type and digits show no abnormalities. Bony prominences are unremarkable. Skin: No Porokeratosis. No infection or ulcers  Diagnosis:  Onychomycosis, , Pain in right toe, pain in left toes  Treatment & Plan Procedures and Treatment: Consent by patient was obtained for treatment procedures. The patient understood the discussion of treatment and procedures well. All questions were answered thoroughly reviewed. Debridement of mycotic and hypertrophic toenails, 1 through 5 bilateral and clearing of subungual debris. No ulceration, no infection noted.  Return Visit-Office Procedure: Patient instructed to return to the office for a follow up visit 3 months   for continued evaluation and treatment.    Mycah Mcdougall DPM 

## 2018-06-16 DIAGNOSIS — C61 Malignant neoplasm of prostate: Secondary | ICD-10-CM | POA: Diagnosis not present

## 2018-06-23 DIAGNOSIS — C61 Malignant neoplasm of prostate: Secondary | ICD-10-CM | POA: Diagnosis not present

## 2018-08-04 DIAGNOSIS — I25709 Atherosclerosis of coronary artery bypass graft(s), unspecified, with unspecified angina pectoris: Secondary | ICD-10-CM | POA: Diagnosis not present

## 2018-08-04 DIAGNOSIS — C61 Malignant neoplasm of prostate: Secondary | ICD-10-CM | POA: Diagnosis not present

## 2018-08-04 DIAGNOSIS — I2510 Atherosclerotic heart disease of native coronary artery without angina pectoris: Secondary | ICD-10-CM | POA: Diagnosis not present

## 2018-08-04 DIAGNOSIS — E7800 Pure hypercholesterolemia, unspecified: Secondary | ICD-10-CM | POA: Diagnosis not present

## 2018-08-04 DIAGNOSIS — Z8546 Personal history of malignant neoplasm of prostate: Secondary | ICD-10-CM | POA: Diagnosis not present

## 2018-08-04 DIAGNOSIS — E1142 Type 2 diabetes mellitus with diabetic polyneuropathy: Secondary | ICD-10-CM | POA: Diagnosis not present

## 2018-08-04 DIAGNOSIS — E119 Type 2 diabetes mellitus without complications: Secondary | ICD-10-CM | POA: Diagnosis not present

## 2018-08-04 DIAGNOSIS — J40 Bronchitis, not specified as acute or chronic: Secondary | ICD-10-CM | POA: Diagnosis not present

## 2018-08-04 DIAGNOSIS — I10 Essential (primary) hypertension: Secondary | ICD-10-CM | POA: Diagnosis not present

## 2018-08-04 DIAGNOSIS — I480 Paroxysmal atrial fibrillation: Secondary | ICD-10-CM | POA: Diagnosis not present

## 2018-09-07 ENCOUNTER — Other Ambulatory Visit: Payer: Self-pay

## 2018-09-07 ENCOUNTER — Encounter: Payer: Self-pay | Admitting: Podiatry

## 2018-09-07 ENCOUNTER — Ambulatory Visit (INDEPENDENT_AMBULATORY_CARE_PROVIDER_SITE_OTHER): Payer: Medicare Other | Admitting: Podiatry

## 2018-09-07 DIAGNOSIS — M79676 Pain in unspecified toe(s): Secondary | ICD-10-CM | POA: Diagnosis not present

## 2018-09-07 DIAGNOSIS — B351 Tinea unguium: Secondary | ICD-10-CM | POA: Diagnosis not present

## 2018-09-07 NOTE — Progress Notes (Signed)
Complaint:  Visit Type: Patient returns to my office for continued preventative foot care services. Complaint: Patient states" my nails have grown long and thick and become painful to walk and wear shoes" . The patient presents for preventative foot care services. No changes to ROS  Podiatric Exam: Vascular: dorsalis pedis and posterior tibial pulses are palpable bilateral. Capillary return is immediate. Temperature gradient is WNL. Skin turgor WNL  Sensorium: Normal Semmes Weinstein monofilament test. Normal tactile sensation bilaterally. Nail Exam: Pt has thick disfigured discolored nails with subungual debris noted bilateral entire nail hallux through fifth toenails Ulcer Exam: There is no evidence of ulcer or pre-ulcerative changes or infection. Orthopedic Exam: Muscle tone and strength are WNL. No limitations in general ROM. No crepitus or effusions noted. Foot type and digits show no abnormalities. Bony prominences are unremarkable. Skin: No Porokeratosis. No infection or ulcers  Diagnosis:  Onychomycosis, , Pain in right toe, pain in left toes  Treatment & Plan Procedures and Treatment: Consent by patient was obtained for treatment procedures. The patient understood the discussion of treatment and procedures well. All questions were answered thoroughly reviewed. Debridement of mycotic and hypertrophic toenails, 1 through 5 bilateral and clearing of subungual debris. No ulceration, no infection noted.  Return Visit-Office Procedure: Patient instructed to return to the office for a follow up visit 3 months   for continued evaluation and treatment.    Naya Ilagan DPM 

## 2018-09-15 DIAGNOSIS — L821 Other seborrheic keratosis: Secondary | ICD-10-CM | POA: Diagnosis not present

## 2018-09-15 DIAGNOSIS — L814 Other melanin hyperpigmentation: Secondary | ICD-10-CM | POA: Diagnosis not present

## 2018-09-15 DIAGNOSIS — L570 Actinic keratosis: Secondary | ICD-10-CM | POA: Diagnosis not present

## 2018-09-15 DIAGNOSIS — C44229 Squamous cell carcinoma of skin of left ear and external auricular canal: Secondary | ICD-10-CM | POA: Diagnosis not present

## 2018-09-15 DIAGNOSIS — Z85828 Personal history of other malignant neoplasm of skin: Secondary | ICD-10-CM | POA: Diagnosis not present

## 2018-09-15 DIAGNOSIS — D485 Neoplasm of uncertain behavior of skin: Secondary | ICD-10-CM | POA: Diagnosis not present

## 2018-09-21 DIAGNOSIS — Z23 Encounter for immunization: Secondary | ICD-10-CM | POA: Diagnosis not present

## 2018-09-26 DIAGNOSIS — D0422 Carcinoma in situ of skin of left ear and external auricular canal: Secondary | ICD-10-CM | POA: Diagnosis not present

## 2018-09-26 DIAGNOSIS — L308 Other specified dermatitis: Secondary | ICD-10-CM | POA: Diagnosis not present

## 2018-12-07 ENCOUNTER — Other Ambulatory Visit: Payer: Self-pay

## 2018-12-07 ENCOUNTER — Encounter: Payer: Self-pay | Admitting: Podiatry

## 2018-12-07 ENCOUNTER — Ambulatory Visit (INDEPENDENT_AMBULATORY_CARE_PROVIDER_SITE_OTHER): Payer: Medicare Other | Admitting: Podiatry

## 2018-12-07 DIAGNOSIS — M79676 Pain in unspecified toe(s): Secondary | ICD-10-CM

## 2018-12-07 DIAGNOSIS — B351 Tinea unguium: Secondary | ICD-10-CM

## 2018-12-07 NOTE — Progress Notes (Signed)
Complaint:  Visit Type: Patient returns to my office for continued preventative foot care services. Complaint: Patient states" my nails have grown long and thick and become painful to walk and wear shoes" . The patient presents for preventative foot care services. No changes to ROS  Podiatric Exam: Vascular: dorsalis pedis and posterior tibial pulses are palpable bilateral. Capillary return is immediate. Temperature gradient is WNL. Skin turgor WNL  Sensorium: Normal Semmes Weinstein monofilament test. Normal tactile sensation bilaterally. Nail Exam: Pt has thick disfigured discolored nails with subungual debris noted bilateral entire nail hallux through fifth toenails Ulcer Exam: There is no evidence of ulcer or pre-ulcerative changes or infection. Orthopedic Exam: Muscle tone and strength are WNL. No limitations in general ROM. No crepitus or effusions noted. Foot type and digits show no abnormalities. Bony prominences are unremarkable. Skin: No Porokeratosis. No infection or ulcers  Diagnosis:  Onychomycosis, , Pain in right toe, pain in left toes  Treatment & Plan Procedures and Treatment: Consent by patient was obtained for treatment procedures. The patient understood the discussion of treatment and procedures well. All questions were answered thoroughly reviewed. Debridement of mycotic and hypertrophic toenails, 1 through 5 bilateral and clearing of subungual debris. No ulceration, no infection noted.  Return Visit-Office Procedure: Patient instructed to return to the office for a follow up visit 3 months   for continued evaluation and treatment.    Terrall Bley DPM 

## 2018-12-15 DIAGNOSIS — C61 Malignant neoplasm of prostate: Secondary | ICD-10-CM | POA: Diagnosis not present

## 2018-12-15 DIAGNOSIS — Z85828 Personal history of other malignant neoplasm of skin: Secondary | ICD-10-CM | POA: Diagnosis not present

## 2018-12-15 DIAGNOSIS — L819 Disorder of pigmentation, unspecified: Secondary | ICD-10-CM | POA: Diagnosis not present

## 2018-12-15 DIAGNOSIS — L570 Actinic keratosis: Secondary | ICD-10-CM | POA: Diagnosis not present

## 2018-12-15 DIAGNOSIS — D485 Neoplasm of uncertain behavior of skin: Secondary | ICD-10-CM | POA: Diagnosis not present

## 2018-12-22 DIAGNOSIS — C61 Malignant neoplasm of prostate: Secondary | ICD-10-CM | POA: Diagnosis not present

## 2018-12-26 ENCOUNTER — Other Ambulatory Visit (HOSPITAL_COMMUNITY): Payer: Self-pay | Admitting: Urology

## 2018-12-26 ENCOUNTER — Other Ambulatory Visit: Payer: Self-pay | Admitting: Urology

## 2018-12-26 DIAGNOSIS — C61 Malignant neoplasm of prostate: Secondary | ICD-10-CM

## 2019-01-17 ENCOUNTER — Encounter (HOSPITAL_COMMUNITY)
Admission: RE | Admit: 2019-01-17 | Discharge: 2019-01-17 | Disposition: A | Discharge status: Home / Self Care | Payer: Medicare Other | Source: Ambulatory Visit | Attending: Urology | Admitting: Urology

## 2019-01-17 ENCOUNTER — Other Ambulatory Visit: Payer: Self-pay

## 2019-01-17 ENCOUNTER — Ambulatory Visit (HOSPITAL_COMMUNITY)
Admission: RE | Admit: 2019-01-17 | Discharge: 2019-01-17 | Disposition: A | Discharge status: Home / Self Care | Payer: Medicare Other | Source: Ambulatory Visit | Attending: Urology | Admitting: Urology

## 2019-01-17 DIAGNOSIS — K8020 Calculus of gallbladder without cholecystitis without obstruction: Secondary | ICD-10-CM | POA: Diagnosis not present

## 2019-01-17 DIAGNOSIS — C61 Malignant neoplasm of prostate: Secondary | ICD-10-CM

## 2019-01-17 MED ORDER — TECHNETIUM TC 99M MEDRONATE IV KIT
21.7000 | PACK | Freq: Once | INTRAVENOUS | Status: AC | PRN
  Administered 2019-01-17: 11:00:00 21.7 via INTRAVENOUS

## 2019-01-25 ENCOUNTER — Ambulatory Visit: Payer: Medicare Other | Attending: Internal Medicine

## 2019-01-25 DIAGNOSIS — Z23 Encounter for immunization: Secondary | ICD-10-CM | POA: Insufficient documentation

## 2019-01-25 NOTE — Progress Notes (Signed)
   Covid-19 Vaccination Clinic  Name:  Johnny Fox    MRN: [id number] DOB: 12/26/1928  01/25/2019  [name] was observed post Covid-19 immunization for 15 minutes without incidence. He was provided with Vaccine Information Sheet and instruction to access the V-Safe system.   [name] was instructed to call 911 with any severe reactions post vaccine: Marland Kitchen Difficulty breathing  . Swelling of your face and throat  . A fast heartbeat  . A bad rash all over your body  . Dizziness and weakness    Immunizations Administered    Name Date Dose VIS Date Route   Pfizer COVID-19 Vaccine 01/25/2019 11:39 AM 0.3 mL 12/16/2018 Intramuscular   Manufacturer: [place]   Lot: [id number]   [place]: [id number]

## 2019-01-26 DIAGNOSIS — Z85828 Personal history of other malignant neoplasm of skin: Secondary | ICD-10-CM | POA: Diagnosis not present

## 2019-01-26 DIAGNOSIS — L905 Scar conditions and fibrosis of skin: Secondary | ICD-10-CM | POA: Diagnosis not present

## 2019-02-02 DIAGNOSIS — C61 Malignant neoplasm of prostate: Secondary | ICD-10-CM | POA: Diagnosis not present

## 2019-02-08 NOTE — Progress Notes (Signed)
Cardiology Office Note   Date:  02/10/2019   ID:  Johnny Fox, DOB 10-06-1928, MRN [id number]  PCP:  Johnny Huddle, MD    No chief complaint on file.  Coronary artery disease  Wt Readings from Last 3 Encounters:  02/10/19 181 lb 6.4 oz (82.3 kg)  02/10/18 177 lb 12.8 oz (80.6 kg)  02/03/17 177 lb 12.8 oz (80.6 kg)       History of Present Illness: Johnny Fox is a 86 y.o. male  who has had CABG in 2004.Prior to CABG, he was not having many symptoms. He had a routine ECG and stress test showing CAD. He had only some DOE.   Exercise limited by foot pain. He has neuropathic painand tingling. His balance is off as well. He has fallen in the past. He had a shoulder problem after a fall.  At the 2020 visit, he had hurt his right arm playing golf.  He has a balance problem.  He has not fallen recently.    He noted some bruising and swelling at his right bicep.    It appeared he had a biceps tendon rupture.  He was managed conservatively.  Since the last visit, he has been less active.  He gets some DOE now.  Legs and feet limit him.    He has had his first COVID shot.  He is scheduled for the second one in a few days.       Past Medical History:  Diagnosis Date  . Actinic keratoses    and seborrheic keratoses  . Arrhythmia    afib postoperatively after CABG in 2004, No recurrence  . Cancer [facility])    prostate   . Coronary artery disease   . Diabetes mellitus without complication (HCC)    diet controlled  . Dyslipidemia   . ED (erectile dysfunction)   . Elevated PSA    2010, workup with Dr. Risa Fox  . H/O prostate biopsy   . Hypertension   . Onychomycosis   . Prostate cancer ([place])    PSA's run approx 7- 2011-2013  . Right shoulder injury    july 2014, Dr. Justice Fox  . Tick bite of abdomen    2013, treated with 3wks of doxycycline, tick engorged 3.48mm nodule left thyroid 4/14- seen on carotid u/s    Past Surgical History:  Procedure  Laterality Date  . CARDIAC SURGERY    . CORONARY ARTERY BYPASS GRAFT  2004     Current Outpatient Medications  Medication Sig Dispense Refill  . aspirin EC 81 MG tablet Take 1 tablet (81 mg total) by mouth daily. 90 tablet 3  . cholecalciferol (VITAMIN D) 1000 UNITS tablet Take 1,000 Units by mouth daily.    . Coenzyme Q10 (COQ-10) 100 MG CAPS Take 1 capsule by mouth daily.    . nitroGLYCERIN (NITROSTAT) 0.4 MG SL tablet Place 1 tablet (0.4 mg total) under the tongue every 5 (five) minutes as needed for chest pain. 25 tablet 3  . Omega-3 Fatty Acids (FISH OIL) 1000 MG CAPS Take 1,000 mg by mouth 2 (two) times daily after a meal.     . rosuvastatin (CRESTOR) 5 MG tablet Take 2.5 mg by mouth every evening. Take one tablet by mouth Monday through Friday.    . TURMERIC PO Take 1 capsule by mouth 2 (two) times daily.     . valsartan (DIOVAN) 80 MG tablet Take 40 mg by mouth 3 (three) times a week. Take one  tablet by mouth Monday, Wednesday, and Friday.      No current facility-administered medications for this visit.    Allergies:   Chocolate flavor and Food    Social History:  The patient  reports that he has never smoked. He has never used smokeless tobacco. He reports that he does not drink alcohol or use drugs.   Family History:  The patient's family history includes CAD in his father; CVA in his father; Hypertension in his father; Stroke in his mother and sister.    ROS:  Please see the history of present illness.   Otherwise, review of systems are positive for DOE.   All other systems are reviewed and negative.    PHYSICAL EXAM: VS:  BP 114/68   Pulse 88   Ht 5\' 11"  (1.803 m)   Wt 181 lb 6.4 oz (82.3 kg)   SpO2 97%   BMI 25.30 kg/m  , BMI Body mass index is 25.3 kg/m. GEN: Well nourished, well developed, in no acute distress  HEENT: normal  Neck: no JVD, carotid bruits, or masses Cardiac: RRR; no murmurs, rubs, or gallops,no edema  Respiratory:  clear to auscultation  bilaterally, normal work of breathing GI: soft, nontender, nondistended, + BS MS: no deformity or atrophy  Skin: warm and dry, no rash Neuro:  Strength and sensation are intact Psych: euthymic mood, full affect   EKG:   The ekg ordered today demonstrates NSR, RBBB   Recent Labs: No results found for requested labs within last 8760 hours.   Lipid Panel No results found for: CHOL, TRIG, HDL, CHOLHDL, VLDL, LDLCALC, LDLDIRECT   Other studies Reviewed: Additional studies/ records that were reviewed today with results demonstrating: 2014 stress reviewed.  Labs from PMD reviewed, 2020.   ASSESSMENT AND PLAN:  1. CAD: Continue aggressive secondary prevention.  He has had some angina in the distant past controlled on medications.  He remains less active.  Given DOE worsening, will plan for lexiscan cardiolite. 2. Hypertension: Well-controlled.  Continue current medications. 3. Hyperlipidemia: Well-controlled in 2020.  Continue current medications.  Healthy, whole food, plant-based diet recommended.  High-fiber diet will be helpful.  Labs to be checked with PMD.    Current medicines are reviewed at length with the patient today.  The patient concerns regarding his medicines were addressed.  The following changes have been made:  No change  Labs/ tests ordered today include: to be done with Johnny Fox No orders of the defined types were placed in this encounter.   Recommend 150 minutes/week of aerobic exercise Low fat, low carb, high fiber diet recommended  Disposition:   FU for stress test   Signed, Johnny Grooms, MD  02/10/2019 8:53 AM    [facility] [place], [place], [place]  [address] Phone: [phone]; Fax: [phone]

## 2019-02-10 ENCOUNTER — Other Ambulatory Visit: Payer: Self-pay

## 2019-02-10 ENCOUNTER — Encounter: Payer: Self-pay | Admitting: Interventional Cardiology

## 2019-02-10 ENCOUNTER — Ambulatory Visit (INDEPENDENT_AMBULATORY_CARE_PROVIDER_SITE_OTHER): Payer: Medicare Other | Admitting: Interventional Cardiology

## 2019-02-10 VITALS — BP 114/68 | HR 88 | Ht 71.0 in | Wt 181.4 lb

## 2019-02-10 DIAGNOSIS — I10 Essential (primary) hypertension: Secondary | ICD-10-CM | POA: Diagnosis not present

## 2019-02-10 DIAGNOSIS — R0600 Dyspnea, unspecified: Secondary | ICD-10-CM

## 2019-02-10 DIAGNOSIS — I2510 Atherosclerotic heart disease of native coronary artery without angina pectoris: Secondary | ICD-10-CM | POA: Diagnosis not present

## 2019-02-10 DIAGNOSIS — R0609 Other forms of dyspnea: Secondary | ICD-10-CM

## 2019-02-10 DIAGNOSIS — E7800 Pure hypercholesterolemia, unspecified: Secondary | ICD-10-CM | POA: Diagnosis not present

## 2019-02-10 NOTE — Patient Instructions (Signed)
Medication Instructions:  Your physician recommends that you continue on your current medications as directed. Please refer to the Current Medication list given to you today.  *If you need a refill on your cardiac medications before your next appointment, please call your pharmacy*  Lab Work: None ordered  If you have labs (blood work) drawn today and your tests are completely normal, you will receive your results only by: Marland Kitchen MyChart Message (if you have MyChart) OR . A paper copy in the mail If you have any lab test that is abnormal or we need to change your treatment, we will call you to review the results.  Testing/Procedures: Your physician has requested that you have a lexiscan myoview. For further information please visit [email or web address]. Please follow instruction sheet, as given.  Follow-Up: At [facility], you and your health needs are our priority.  As part of our continuing mission to provide you with exceptional heart care, we have created designated Provider Care Teams.  These Care Teams include your primary Cardiologist (physician) and Advanced Practice Providers (APPs -  Physician Assistants and Nurse Practitioners) who all work together to provide you with the care you need, when you need it.  Your next appointment:   12 month(s)  The format for your next appointment:   In Person  Provider:   You may see Larae Grooms, MD or one of the following Advanced Practice Providers on your designated Care Team:    Melina Copa, PA-C  Ermalinda Barrios, PA-C   Other Instructions

## 2019-02-15 ENCOUNTER — Ambulatory Visit: Payer: Medicare Other | Attending: Internal Medicine

## 2019-02-15 DIAGNOSIS — Z23 Encounter for immunization: Secondary | ICD-10-CM | POA: Insufficient documentation

## 2019-02-15 NOTE — Progress Notes (Signed)
   Covid-19 Vaccination Clinic  Name:  Johnny Fox    MRN: [id number] DOB: 1928/07/19  02/15/2019  [name] was observed post Covid-19 immunization for 15 minutes without incidence. He was provided with Vaccine Information Sheet and instruction to access the V-Safe system.   [name] was instructed to call 911 with any severe reactions post vaccine: Marland Kitchen Difficulty breathing  . Swelling of your face and throat  . A fast heartbeat  . A bad rash all over your body  . Dizziness and weakness    Immunizations Administered    Name Date Dose VIS Date Route   Pfizer COVID-19 Vaccine 02/15/2019  8:14 AM 0.3 mL 12/16/2018 Intramuscular   Manufacturer: [place]   Lot: [id number]   [place]: [id number]

## 2019-02-20 DIAGNOSIS — Z0000 Encounter for general adult medical examination without abnormal findings: Secondary | ICD-10-CM | POA: Diagnosis not present

## 2019-02-20 DIAGNOSIS — Z1389 Encounter for screening for other disorder: Secondary | ICD-10-CM | POA: Diagnosis not present

## 2019-02-21 ENCOUNTER — Telehealth (HOSPITAL_COMMUNITY): Payer: Self-pay | Admitting: *Deleted

## 2019-02-21 NOTE — Telephone Encounter (Signed)
Patient given detailed instructions per Myocardial Perfusion Study Information Sheet for the test on 02/24/19 at 7:45. Patient notified to arrive 15 minutes early and that it is imperative to arrive on time for appointment to keep from having the test rescheduled.  If you need to cancel or reschedule your appointment, please call the office within 24 hours of your appointment. . Patient verbalized understanding.Johnny Fox

## 2019-02-22 ENCOUNTER — Encounter (HOSPITAL_COMMUNITY): Payer: Medicare Other

## 2019-02-22 DIAGNOSIS — R2689 Other abnormalities of gait and mobility: Secondary | ICD-10-CM | POA: Diagnosis not present

## 2019-02-22 DIAGNOSIS — E7800 Pure hypercholesterolemia, unspecified: Secondary | ICD-10-CM | POA: Diagnosis not present

## 2019-02-22 DIAGNOSIS — E119 Type 2 diabetes mellitus without complications: Secondary | ICD-10-CM | POA: Diagnosis not present

## 2019-02-22 DIAGNOSIS — R258 Other abnormal involuntary movements: Secondary | ICD-10-CM | POA: Diagnosis not present

## 2019-02-22 DIAGNOSIS — I25709 Atherosclerosis of coronary artery bypass graft(s), unspecified, with unspecified angina pectoris: Secondary | ICD-10-CM | POA: Diagnosis not present

## 2019-02-22 DIAGNOSIS — Z79899 Other long term (current) drug therapy: Secondary | ICD-10-CM | POA: Diagnosis not present

## 2019-02-22 DIAGNOSIS — E559 Vitamin D deficiency, unspecified: Secondary | ICD-10-CM | POA: Diagnosis not present

## 2019-02-22 DIAGNOSIS — E1142 Type 2 diabetes mellitus with diabetic polyneuropathy: Secondary | ICD-10-CM | POA: Diagnosis not present

## 2019-02-22 DIAGNOSIS — C61 Malignant neoplasm of prostate: Secondary | ICD-10-CM | POA: Diagnosis not present

## 2019-02-22 DIAGNOSIS — R251 Tremor, unspecified: Secondary | ICD-10-CM | POA: Diagnosis not present

## 2019-02-22 DIAGNOSIS — J40 Bronchitis, not specified as acute or chronic: Secondary | ICD-10-CM | POA: Diagnosis not present

## 2019-02-22 DIAGNOSIS — Z8546 Personal history of malignant neoplasm of prostate: Secondary | ICD-10-CM | POA: Diagnosis not present

## 2019-02-22 DIAGNOSIS — I10 Essential (primary) hypertension: Secondary | ICD-10-CM | POA: Diagnosis not present

## 2019-02-22 DIAGNOSIS — R5383 Other fatigue: Secondary | ICD-10-CM | POA: Diagnosis not present

## 2019-02-22 DIAGNOSIS — I2510 Atherosclerotic heart disease of native coronary artery without angina pectoris: Secondary | ICD-10-CM | POA: Diagnosis not present

## 2019-02-22 DIAGNOSIS — R600 Localized edema: Secondary | ICD-10-CM | POA: Diagnosis not present

## 2019-02-22 DIAGNOSIS — I480 Paroxysmal atrial fibrillation: Secondary | ICD-10-CM | POA: Diagnosis not present

## 2019-02-23 ENCOUNTER — Telehealth (HOSPITAL_COMMUNITY): Payer: Self-pay | Admitting: *Deleted

## 2019-02-23 NOTE — Telephone Encounter (Signed)
Spoke to patient

## 2019-02-24 ENCOUNTER — Other Ambulatory Visit: Payer: Self-pay

## 2019-02-24 ENCOUNTER — Ambulatory Visit (HOSPITAL_COMMUNITY): Payer: Medicare Other | Attending: Cardiovascular Disease

## 2019-02-24 DIAGNOSIS — I2510 Atherosclerotic heart disease of native coronary artery without angina pectoris: Secondary | ICD-10-CM | POA: Diagnosis not present

## 2019-02-24 DIAGNOSIS — E7800 Pure hypercholesterolemia, unspecified: Secondary | ICD-10-CM | POA: Insufficient documentation

## 2019-02-24 DIAGNOSIS — I10 Essential (primary) hypertension: Secondary | ICD-10-CM | POA: Insufficient documentation

## 2019-02-24 DIAGNOSIS — R0609 Other forms of dyspnea: Secondary | ICD-10-CM

## 2019-02-24 DIAGNOSIS — R0600 Dyspnea, unspecified: Secondary | ICD-10-CM | POA: Insufficient documentation

## 2019-02-24 LAB — MYOCARDIAL PERFUSION IMAGING
LV dias vol: 79 mL (ref 62–150)
LV sys vol: 25 mL
Peak HR: 95 {beats}/min
Rest HR: 70 {beats}/min
SDS: 1
SRS: 0
SSS: 1
TID: 0.93

## 2019-02-24 MED ORDER — REGADENOSON 0.4 MG/5ML IV SOLN
0.4000 mg | Freq: Once | INTRAVENOUS | Status: AC
  Administered 2019-02-24: 0.4 mg via INTRAVENOUS

## 2019-02-24 MED ORDER — TECHNETIUM TC 99M TETROFOSMIN IV KIT
31.7000 | PACK | Freq: Once | INTRAVENOUS | Status: AC | PRN
  Administered 2019-02-24: 31.7 via INTRAVENOUS
  Filled 2019-02-24: qty 32

## 2019-02-24 MED ORDER — TECHNETIUM TC 99M TETROFOSMIN IV KIT
10.4000 | PACK | Freq: Once | INTRAVENOUS | Status: AC | PRN
  Administered 2019-02-24: 10.4 via INTRAVENOUS
  Filled 2019-02-24: qty 11

## 2019-03-03 ENCOUNTER — Encounter: Payer: Self-pay | Admitting: Neurology

## 2019-03-08 ENCOUNTER — Encounter: Payer: Self-pay | Admitting: Podiatry

## 2019-03-08 ENCOUNTER — Other Ambulatory Visit: Payer: Self-pay

## 2019-03-08 ENCOUNTER — Ambulatory Visit (INDEPENDENT_AMBULATORY_CARE_PROVIDER_SITE_OTHER): Payer: Medicare Other | Admitting: Podiatry

## 2019-03-08 VITALS — Temp 96.3°F

## 2019-03-08 DIAGNOSIS — M79676 Pain in unspecified toe(s): Secondary | ICD-10-CM | POA: Diagnosis not present

## 2019-03-08 DIAGNOSIS — B351 Tinea unguium: Secondary | ICD-10-CM | POA: Diagnosis not present

## 2019-03-08 NOTE — Progress Notes (Signed)
Complaint:  Visit Type: Patient returns to my office for continued preventative foot care services. Complaint: Patient states" my nails have grown long and thick and become painful to walk and wear shoes" . The patient presents for preventative foot care services. No changes to ROS  Podiatric Exam: Vascular: dorsalis pedis and posterior tibial pulses are palpable bilateral. Capillary return is immediate. Temperature gradient is WNL. Skin turgor WNL  Sensorium: Normal Semmes Weinstein monofilament test. Normal tactile sensation bilaterally. Nail Exam: Pt has thick disfigured discolored nails with subungual debris noted bilateral entire nail hallux through fifth toenails Ulcer Exam: There is no evidence of ulcer or pre-ulcerative changes or infection. Orthopedic Exam: Muscle tone and strength are WNL. No limitations in general ROM. No crepitus or effusions noted. Foot type and digits show no abnormalities. Bony prominences are unremarkable. Skin: No Porokeratosis. No infection or ulcers  Diagnosis:  Onychomycosis, , Pain in right toe, pain in left toes  Treatment & Plan Procedures and Treatment: Consent by patient was obtained for treatment procedures. The patient understood the discussion of treatment and procedures well. All questions were answered thoroughly reviewed. Debridement of mycotic and hypertrophic toenails, 1 through 5 bilateral and clearing of subungual debris. No ulceration, no infection noted.  Return Visit-Office Procedure: Patient instructed to return to the office for a follow up visit 3 months   for continued evaluation and treatment.    Amarri Michaelson DPM 

## 2019-03-21 DIAGNOSIS — E7800 Pure hypercholesterolemia, unspecified: Secondary | ICD-10-CM | POA: Diagnosis not present

## 2019-03-21 DIAGNOSIS — C61 Malignant neoplasm of prostate: Secondary | ICD-10-CM | POA: Diagnosis not present

## 2019-03-21 DIAGNOSIS — I25709 Atherosclerosis of coronary artery bypass graft(s), unspecified, with unspecified angina pectoris: Secondary | ICD-10-CM | POA: Diagnosis not present

## 2019-03-21 DIAGNOSIS — I10 Essential (primary) hypertension: Secondary | ICD-10-CM | POA: Diagnosis not present

## 2019-03-21 DIAGNOSIS — Z8546 Personal history of malignant neoplasm of prostate: Secondary | ICD-10-CM | POA: Diagnosis not present

## 2019-03-21 DIAGNOSIS — I480 Paroxysmal atrial fibrillation: Secondary | ICD-10-CM | POA: Diagnosis not present

## 2019-03-21 DIAGNOSIS — E119 Type 2 diabetes mellitus without complications: Secondary | ICD-10-CM | POA: Diagnosis not present

## 2019-03-21 DIAGNOSIS — E1142 Type 2 diabetes mellitus with diabetic polyneuropathy: Secondary | ICD-10-CM | POA: Diagnosis not present

## 2019-03-21 DIAGNOSIS — I2510 Atherosclerotic heart disease of native coronary artery without angina pectoris: Secondary | ICD-10-CM | POA: Diagnosis not present

## 2019-03-21 DIAGNOSIS — J40 Bronchitis, not specified as acute or chronic: Secondary | ICD-10-CM | POA: Diagnosis not present

## 2019-03-28 DIAGNOSIS — Z9849 Cataract extraction status, unspecified eye: Secondary | ICD-10-CM | POA: Diagnosis not present

## 2019-03-28 DIAGNOSIS — Z961 Presence of intraocular lens: Secondary | ICD-10-CM | POA: Diagnosis not present

## 2019-03-28 DIAGNOSIS — H04123 Dry eye syndrome of bilateral lacrimal glands: Secondary | ICD-10-CM | POA: Diagnosis not present

## 2019-03-28 DIAGNOSIS — H43393 Other vitreous opacities, bilateral: Secondary | ICD-10-CM | POA: Diagnosis not present

## 2019-03-28 DIAGNOSIS — H43813 Vitreous degeneration, bilateral: Secondary | ICD-10-CM | POA: Diagnosis not present

## 2019-03-30 DIAGNOSIS — R2689 Other abnormalities of gait and mobility: Secondary | ICD-10-CM | POA: Diagnosis not present

## 2019-04-04 DIAGNOSIS — R2689 Other abnormalities of gait and mobility: Secondary | ICD-10-CM | POA: Diagnosis not present

## 2019-04-06 DIAGNOSIS — R2689 Other abnormalities of gait and mobility: Secondary | ICD-10-CM | POA: Diagnosis not present

## 2019-04-10 DIAGNOSIS — R2689 Other abnormalities of gait and mobility: Secondary | ICD-10-CM | POA: Diagnosis not present

## 2019-04-10 NOTE — Progress Notes (Signed)
Assessment/Plan:    1.  Tremor  -has mixed tremor type today.  Certainly has features of essential tremor.  However, also has some very mild rest tremor on the left, including while ambulating.  He also has some evidence of bradykinesia on the left that I think needs further explored.  We will go ahead and schedule the DAT scan.  2.  Gait disorder  -likely multifactorial.  Has evidence of PN, which likely is the biggest factor in his gait and balance.  He is currently in physical therapy.  Once out of physical therapy, he and I talked about the importance of either continuing his physical therapy exercises, hiring a trainer or going to the gym regularly.  He expressed understanding with.  We talked about the fact that he may need an ambulatory assistive device.  -We did talk about medications for neuropathy.  He does have some paresthesias associated with the neuropathy, but decided to hold off on those for now.  We discussed that the medications would not help with balance.  3.  I will plan on seeing him back in the next 6 to 8 weeks, following completion of his DaTscan.  Subjective:   Johnny Fox was seen today in the movement disorders clinic for neurologic consultation at the request of Josetta Huddle, MD.  The consultation is for the evaluation of "bradykinesia and tremor."  Medical records made available to me have been reviewed.  He saw his primary care physician on February 22, 2019 and the above symptoms were noted and he was sent here to be further evaluated.   Specific Symptoms:  Tremor: Yes.  , states that he attributes it to a fall 8 years ago and he fell on the R shoulder and the tremor didn't start right away but sometime thereafter.  He has R more than L hand tremor.  He has tremor mostly with holding things.  No tremor in the legs.   Family hx of similar:  No. Voice: no change Sleep: sleeps well except "self diagnosed neuropathy" at night - feet and legs  numb  Vivid Dreams:  Yes.    Acting out dreams:  Yes.   - some sleep talk - one time fell out of the bed 4-5 years ago Wet Pillows: Yes.   Postural symptoms:  Yes.    Falls?  Yes.  , none recently - last fall was 2-3 years ago Bradykinesia symptoms: slow movements, difficulty getting out of a chair and shorter stride length Loss of smell:  Yes.   Loss of taste:  No. Urinary Incontinence:  Yes.  , no need for undergarments.  urgency Difficulty Swallowing:  No. Handwriting, micrographia: Yes.   Trouble with ADL's:  No.  Trouble buttoning clothing: No. Depression:  No. Memory changes:  Yes.  , mild N/V:  No. Lightheaded: occasionally  Syncope: No. Diplopia:  Yes.   - talked with eye doc about it but doesn't remember cause - goes away if closes eye.  Mostly when watches TV Dyskinesia:  No. Prior exposure to reglan/antipsychotics: No.  Neuroimaging of the brain has not previously been performed   ALLERGIES:   Allergies  Allergen Reactions  . Chocolate Flavor   . Food Other (See Comments)    Chocolate-Sneezing    CURRENT MEDICATIONS:  Current Outpatient Medications  Medication Instructions  . aspirin EC 81 mg, Oral, Daily  . cholecalciferol (VITAMIN D) 1,000 Units, Daily  . Coenzyme Q10 (COQ-10) 100 MG CAPS 1 capsule, Daily  .  Fish Oil 1,000 mg, Oral, 2 times daily after meals  . nitroGLYCERIN (NITROSTAT) 0.4 mg, Sublingual, Every 5 min PRN  . rosuvastatin (CRESTOR) 2.5 mg, Oral, Every evening, Take one tablet by mouth Monday through Friday.  . TURMERIC PO 1 capsule, Oral, 2 times daily  . valsartan (DIOVAN) 40 MG tablet Takes 1/2 tablet daily  . valsartan (DIOVAN) 40 mg, Oral, 3 times weekly, Take one tablet by mouth Monday, Wednesday, and Friday.    Objective:   VITALS:   Vitals:   04/11/19 0939  BP: 97/64  Pulse: 85  Resp: 18  SpO2: 97%  Weight: 178 lb (80.7 kg)  Height: 5\' 10"  (1.778 m)    GEN:  The patient appears stated age and is in NAD. HEENT:   Normocephalic, atraumatic.  The mucous membranes are moist. The superficial temporal arteries are without ropiness or tenderness. CV:  RRR Lungs:  CTAB Neck/HEME:  There are no carotid bruits bilaterally.  Neurological examination:  Orientation: The patient is alert and oriented x3.  Cranial nerves: There is good facial symmetry. Extraocular muscles are intact. The visual fields are full to confrontational testing. The speech is fluent and clear. Soft palate rises symmetrically and there is no tongue deviation. Hearing is decreased to conversational tone. Sensation: Sensation is intact to light and pinprick throughout (facial, trunk, extremities). Vibration is intact at the bilateral big toe. There is no extinction with double simultaneous stimulation. There is no sensory dermatomal level identified. Motor: Strength is 5/5 in the bilateral upper and lower extremities.   Shoulder shrug is equal and symmetric.  There is no pronator drift. Deep tendon reflexes: Deep tendon reflexes are 2-2+/4 at the bilateral biceps, triceps, brachioradialis, patella and achilles. Plantar responses are downgoing bilaterally.  Movement examination: Tone: There is ? Mild increased tone in the bilateral lower extremities.  Once relaxed, it is good in the UE.  Has trouble relaxing Abnormal movements: rare rest tremor on the LUE with distraction.  Has mild postural tremor.   Coordination:  There is mild decremation with RAM's, with finger taps on the L Gait and Station: The patient has min difficulty arising out of a deep-seated chair without the use of the hands. The patient's stride length is good.  There is mild rest tremor on the L.    I have reviewed and interpreted the following labs independently Patient had lab work on February 22, 2019.  AST was 17, ALT 14, alkaline phosphatase 40.  TSH was 1.65.  White blood cells were 5.6, hemoglobin 12.9, hematocrit 37.9 and platelets 134.  B12 was greater than  1500.   Total time spent on today's visit was 60 minutes, including both face-to-face time and nonface-to-face time.  Time included that spent on review of records (prior notes available to me/labs/imaging if pertinent), discussing treatment and goals, answering patient's questions and coordinating care.  Cc:  Josetta Huddle, MD

## 2019-04-11 ENCOUNTER — Telehealth: Payer: Self-pay

## 2019-04-11 ENCOUNTER — Encounter: Payer: Self-pay | Admitting: Neurology

## 2019-04-11 ENCOUNTER — Other Ambulatory Visit: Payer: Self-pay

## 2019-04-11 ENCOUNTER — Ambulatory Visit (INDEPENDENT_AMBULATORY_CARE_PROVIDER_SITE_OTHER): Payer: Medicare Other | Admitting: Neurology

## 2019-04-11 VITALS — BP 97/64 | HR 85 | Resp 18 | Ht 70.0 in | Wt 178.0 lb

## 2019-04-11 DIAGNOSIS — R251 Tremor, unspecified: Secondary | ICD-10-CM | POA: Diagnosis not present

## 2019-04-11 DIAGNOSIS — I2510 Atherosclerotic heart disease of native coronary artery without angina pectoris: Secondary | ICD-10-CM

## 2019-04-11 DIAGNOSIS — G609 Hereditary and idiopathic neuropathy, unspecified: Secondary | ICD-10-CM

## 2019-04-11 NOTE — Patient Instructions (Signed)
1.  We will get you scheduled for a DaT scan, which measures the dopamine in your brain.  They will call you to schedule it  The physicians and staff at [facility] are committed to providing excellent care. You may receive a survey requesting feedback about your experience at our office. We strive to receive "very good" responses to the survey questions. If you feel that your experience would prevent you from giving the office a "very good " response, please contact our office to try to remedy the situation. We may be reached at [phone]. Thank you for taking the time out of your busy day to complete the survey.

## 2019-04-11 NOTE — Progress Notes (Unsigned)
Started process for [employer], sent To [facility] , patient signed and faxed and sent to Scan.

## 2019-04-12 NOTE — Telephone Encounter (Signed)
Close encounter 

## 2019-04-14 DIAGNOSIS — R2689 Other abnormalities of gait and mobility: Secondary | ICD-10-CM | POA: Diagnosis not present

## 2019-04-17 DIAGNOSIS — R2689 Other abnormalities of gait and mobility: Secondary | ICD-10-CM | POA: Diagnosis not present

## 2019-04-21 DIAGNOSIS — R2689 Other abnormalities of gait and mobility: Secondary | ICD-10-CM | POA: Diagnosis not present

## 2019-04-24 DIAGNOSIS — I25709 Atherosclerosis of coronary artery bypass graft(s), unspecified, with unspecified angina pectoris: Secondary | ICD-10-CM | POA: Diagnosis not present

## 2019-04-24 DIAGNOSIS — Z8546 Personal history of malignant neoplasm of prostate: Secondary | ICD-10-CM | POA: Diagnosis not present

## 2019-04-24 DIAGNOSIS — C61 Malignant neoplasm of prostate: Secondary | ICD-10-CM | POA: Diagnosis not present

## 2019-04-24 DIAGNOSIS — E119 Type 2 diabetes mellitus without complications: Secondary | ICD-10-CM | POA: Diagnosis not present

## 2019-04-24 DIAGNOSIS — I2510 Atherosclerotic heart disease of native coronary artery without angina pectoris: Secondary | ICD-10-CM | POA: Diagnosis not present

## 2019-04-24 DIAGNOSIS — E7800 Pure hypercholesterolemia, unspecified: Secondary | ICD-10-CM | POA: Diagnosis not present

## 2019-04-24 DIAGNOSIS — I480 Paroxysmal atrial fibrillation: Secondary | ICD-10-CM | POA: Diagnosis not present

## 2019-04-24 DIAGNOSIS — I10 Essential (primary) hypertension: Secondary | ICD-10-CM | POA: Diagnosis not present

## 2019-04-24 DIAGNOSIS — R2689 Other abnormalities of gait and mobility: Secondary | ICD-10-CM | POA: Diagnosis not present

## 2019-04-24 DIAGNOSIS — E1142 Type 2 diabetes mellitus with diabetic polyneuropathy: Secondary | ICD-10-CM | POA: Diagnosis not present

## 2019-04-24 DIAGNOSIS — J40 Bronchitis, not specified as acute or chronic: Secondary | ICD-10-CM | POA: Diagnosis not present

## 2019-04-26 DIAGNOSIS — I2510 Atherosclerotic heart disease of native coronary artery without angina pectoris: Secondary | ICD-10-CM | POA: Diagnosis not present

## 2019-04-26 DIAGNOSIS — R2689 Other abnormalities of gait and mobility: Secondary | ICD-10-CM | POA: Diagnosis not present

## 2019-04-26 DIAGNOSIS — E1142 Type 2 diabetes mellitus with diabetic polyneuropathy: Secondary | ICD-10-CM | POA: Diagnosis not present

## 2019-04-26 DIAGNOSIS — Z8546 Personal history of malignant neoplasm of prostate: Secondary | ICD-10-CM | POA: Diagnosis not present

## 2019-04-26 DIAGNOSIS — R251 Tremor, unspecified: Secondary | ICD-10-CM | POA: Diagnosis not present

## 2019-04-26 DIAGNOSIS — E7800 Pure hypercholesterolemia, unspecified: Secondary | ICD-10-CM | POA: Diagnosis not present

## 2019-04-26 DIAGNOSIS — R258 Other abnormal involuntary movements: Secondary | ICD-10-CM | POA: Diagnosis not present

## 2019-04-26 DIAGNOSIS — I10 Essential (primary) hypertension: Secondary | ICD-10-CM | POA: Diagnosis not present

## 2019-04-26 DIAGNOSIS — R600 Localized edema: Secondary | ICD-10-CM | POA: Diagnosis not present

## 2019-04-26 DIAGNOSIS — R5383 Other fatigue: Secondary | ICD-10-CM | POA: Diagnosis not present

## 2019-04-26 DIAGNOSIS — E559 Vitamin D deficiency, unspecified: Secondary | ICD-10-CM | POA: Diagnosis not present

## 2019-05-02 DIAGNOSIS — R2689 Other abnormalities of gait and mobility: Secondary | ICD-10-CM | POA: Diagnosis not present

## 2019-05-04 ENCOUNTER — Ambulatory Visit (HOSPITAL_COMMUNITY)
Admission: RE | Admit: 2019-05-04 | Discharge: 2019-05-04 | Disposition: A | Discharge status: Home / Self Care | Payer: Medicare Other | Source: Ambulatory Visit | Attending: Neurology | Admitting: Neurology

## 2019-05-04 ENCOUNTER — Other Ambulatory Visit: Payer: Self-pay

## 2019-05-04 DIAGNOSIS — R251 Tremor, unspecified: Secondary | ICD-10-CM | POA: Diagnosis not present

## 2019-05-04 MED ORDER — IOFLUPANE I 123 185 MBQ/2.5ML IV SOLN
5.0700 | Freq: Once | INTRAVENOUS | Status: AC
  Administered 2019-05-04: 5.07 via INTRAVENOUS

## 2019-05-04 MED ORDER — IODINE STRONG (LUGOLS) 5 % PO SOLN
ORAL | Status: AC
  Filled 2019-05-04: qty 1

## 2019-05-04 MED ORDER — IODINE STRONG (LUGOLS) 5 % PO SOLN
0.8000 mL | Freq: Once | ORAL | Status: AC
  Administered 2019-05-04: 09:00:00 0.8 mL via ORAL

## 2019-05-05 DIAGNOSIS — R2689 Other abnormalities of gait and mobility: Secondary | ICD-10-CM | POA: Diagnosis not present

## 2019-05-08 DIAGNOSIS — R2689 Other abnormalities of gait and mobility: Secondary | ICD-10-CM | POA: Diagnosis not present

## 2019-05-10 DIAGNOSIS — R2689 Other abnormalities of gait and mobility: Secondary | ICD-10-CM | POA: Diagnosis not present

## 2019-05-16 DIAGNOSIS — R2689 Other abnormalities of gait and mobility: Secondary | ICD-10-CM | POA: Diagnosis not present

## 2019-05-19 DIAGNOSIS — R2689 Other abnormalities of gait and mobility: Secondary | ICD-10-CM | POA: Diagnosis not present

## 2019-05-24 DIAGNOSIS — R2689 Other abnormalities of gait and mobility: Secondary | ICD-10-CM | POA: Diagnosis not present

## 2019-05-24 NOTE — Progress Notes (Signed)
Assessment/Plan:   1.  ET/PD  -DaTscan did show some loss of dopamine.  -Ultimately decided to try carbidopa/levodopa 25/100 and slowly work up to 1 tablet 3 times per day.  R/B/SE were discussed.  The opportunity to ask questions was given and they were answered to the best of my ability.  The patient expressed understanding and willingness to follow the outlined treatment protocols.  -We discussed that it used to be thought that levodopa would increase risk of melanoma but now it is believed that Parkinsons itself likely increases risk of melanoma. he is to get regular skin checks.  -he is in PT now   -We discussed community resources in the area including patient support groups and community exercise programs for PD and pt education was provided to the patient.  2.  Gait disorder  -Again, continues to be multifactorial, likely from peripheral neuropathy, cerebellar outflow tracts from essential tremor and potentially even Parkinson's.  As above, he is currently in physical therapy.  Discussed exercise programs in the area that can help as well.  Information given on those. Subjective:   Johnny Fox was seen today in follow up for Parkinsons disease.  My previous records were reviewed prior to todays visit as well as outside records available to me.  This patient is accompanied in the office by his spouse who supplements the history. Pt denies falls.  Patient had a DaTscan since our last visit.  I personally reviewed that.  There was decreased activity in the bilateral basal ganglia and in the left stratum.  Current prescribed movement disorder medications: none    ALLERGIES:   Allergies  Allergen Reactions  . Chocolate Flavor   . Food Other (See Comments)    Chocolate-Sneezing    CURRENT MEDICATIONS:  Outpatient Encounter Medications as of 05/26/2019  Medication Sig  . aspirin EC 81 MG tablet Take 1 tablet (81 mg total) by mouth daily.  . cholecalciferol (VITAMIN D)  1000 UNITS tablet Take 1,000 Units by mouth daily.  . Coenzyme Q10 (COQ-10) 100 MG CAPS Take 1 capsule by mouth daily.  . nitroGLYCERIN (NITROSTAT) 0.4 MG SL tablet Place 1 tablet (0.4 mg total) under the tongue every 5 (five) minutes as needed for chest pain.  . Omega-3 Fatty Acids (FISH OIL) 1000 MG CAPS Take 1,000 mg by mouth 2 (two) times daily after a meal.   . rosuvastatin (CRESTOR) 5 MG tablet Take 2.5 mg by mouth every evening. Take one tablet by mouth Monday through Friday.  . TURMERIC PO Take 1 capsule by mouth 2 (two) times daily.   . valsartan (DIOVAN) 40 MG tablet Takes 1/2 tablet daily  . carbidopa-levodopa (SINEMET IR) 25-100 MG tablet Take 1 tablet by mouth 3 (three) times daily.  . [DISCONTINUED] valsartan (DIOVAN) 80 MG tablet Take 40 mg by mouth 3 (three) times a week. Take one tablet by mouth Monday, Wednesday, and Friday.    No facility-administered encounter medications on file as of 05/26/2019.    Objective:   PHYSICAL EXAMINATION:    VITALS:   Vitals:   05/26/19 0907  BP: (!) 146/79  Pulse: 81  SpO2: 100%  Weight: 177 lb (80.3 kg)  Height: 5' 10.5" (1.791 m)    GEN:  The patient appears stated age and is in NAD. HEENT:  Normocephalic, atraumatic.  The mucous membranes are moist. The superficial temporal arteries are without ropiness or tenderness. CV:  RRR Lungs:  CTAB Neck/HEME:  There are no carotid bruits  bilaterally.  Neurological examination:  Orientation: The patient is alert and oriented x3. Cranial nerves: There is good facial symmetry with minimal facial hypomimia. The speech is fluent and clear. Soft palate rises symmetrically and there is no tongue deviation. Hearing is intact to conversational tone. Sensation: Sensation is intact to light touch throughout Motor: Strength is at least antigravity x4.  Movement examination: Tone: There is mild increased tone in the LUE Abnormal movements: mild rest tremor on the L with distraction  only Coordination:  There is mild decremation with RAM's, with finger taps on the L Gait and Station: The patient has no difficulty arising out of a deep-seated chair without the use of the hands. The patient's stride length is good with decreased arm swing on the L.    Total time spent on today's visit was 30 minutes, including both face-to-face time and nonface-to-face time.  Time included that spent on review of records (prior notes available to me/labs/imaging if pertinent), discussing treatment and goals, answering patient's questions and coordinating care.  Cc:  Josetta Huddle, MD

## 2019-05-26 ENCOUNTER — Other Ambulatory Visit: Payer: Self-pay

## 2019-05-26 ENCOUNTER — Ambulatory Visit (INDEPENDENT_AMBULATORY_CARE_PROVIDER_SITE_OTHER): Payer: Medicare Other | Admitting: Neurology

## 2019-05-26 ENCOUNTER — Encounter: Payer: Self-pay | Admitting: Neurology

## 2019-05-26 VITALS — BP 146/79 | HR 81 | Ht 70.5 in | Wt 177.0 lb

## 2019-05-26 DIAGNOSIS — G20 Parkinson's disease: Secondary | ICD-10-CM

## 2019-05-26 DIAGNOSIS — I2510 Atherosclerotic heart disease of native coronary artery without angina pectoris: Secondary | ICD-10-CM

## 2019-05-26 DIAGNOSIS — R2689 Other abnormalities of gait and mobility: Secondary | ICD-10-CM | POA: Diagnosis not present

## 2019-05-26 DIAGNOSIS — R251 Tremor, unspecified: Secondary | ICD-10-CM

## 2019-05-26 MED ORDER — CARBIDOPA-LEVODOPA 25-100 MG PO TABS: 1.0000 | ORAL_TABLET | Freq: Three times a day (TID) | ORAL | 1 refills | Status: DC

## 2019-05-26 NOTE — Patient Instructions (Signed)
Start Carbidopa Levodopa as follows: Take 1/2 tablet three times daily, at least 30 minutes before meals (approximately 7am/11am/4pm), for one week Then take 1/2 tablet in the morning, 1/2 tablet in the afternoon, 1 tablet in the evening, at least 30 minutes before meals, for one week Then take 1/2 tablet in the morning, 1 tablet in the afternoon, 1 tablet in the evening, at least 30 minutes before meals, for one week Then take 1 tablet three times daily at 7am/11am/4pm, at least 30 minutes before meals   As a reminder, carbidopa/levodopa can be taken at the same time as a carbohydrate, but we like to have you take your pill either 30 minutes before a protein source or 1 hour after as protein can interfere with carbidopa/levodopa absorption.  

## 2019-06-02 DIAGNOSIS — Z8546 Personal history of malignant neoplasm of prostate: Secondary | ICD-10-CM | POA: Diagnosis not present

## 2019-06-02 DIAGNOSIS — E7800 Pure hypercholesterolemia, unspecified: Secondary | ICD-10-CM | POA: Diagnosis not present

## 2019-06-02 DIAGNOSIS — E119 Type 2 diabetes mellitus without complications: Secondary | ICD-10-CM | POA: Diagnosis not present

## 2019-06-02 DIAGNOSIS — I10 Essential (primary) hypertension: Secondary | ICD-10-CM | POA: Diagnosis not present

## 2019-06-02 DIAGNOSIS — I480 Paroxysmal atrial fibrillation: Secondary | ICD-10-CM | POA: Diagnosis not present

## 2019-06-02 DIAGNOSIS — J40 Bronchitis, not specified as acute or chronic: Secondary | ICD-10-CM | POA: Diagnosis not present

## 2019-06-02 DIAGNOSIS — I25709 Atherosclerosis of coronary artery bypass graft(s), unspecified, with unspecified angina pectoris: Secondary | ICD-10-CM | POA: Diagnosis not present

## 2019-06-02 DIAGNOSIS — E1142 Type 2 diabetes mellitus with diabetic polyneuropathy: Secondary | ICD-10-CM | POA: Diagnosis not present

## 2019-06-02 DIAGNOSIS — I2510 Atherosclerotic heart disease of native coronary artery without angina pectoris: Secondary | ICD-10-CM | POA: Diagnosis not present

## 2019-06-02 DIAGNOSIS — C61 Malignant neoplasm of prostate: Secondary | ICD-10-CM | POA: Diagnosis not present

## 2019-06-07 ENCOUNTER — Encounter: Payer: Self-pay | Admitting: Podiatry

## 2019-06-07 ENCOUNTER — Ambulatory Visit (INDEPENDENT_AMBULATORY_CARE_PROVIDER_SITE_OTHER): Payer: Medicare Other | Admitting: Podiatry

## 2019-06-07 ENCOUNTER — Other Ambulatory Visit: Payer: Self-pay

## 2019-06-07 VITALS — Temp 97.7°F

## 2019-06-07 DIAGNOSIS — B351 Tinea unguium: Secondary | ICD-10-CM

## 2019-06-07 DIAGNOSIS — M79676 Pain in unspecified toe(s): Secondary | ICD-10-CM | POA: Diagnosis not present

## 2019-06-07 DIAGNOSIS — R2689 Other abnormalities of gait and mobility: Secondary | ICD-10-CM | POA: Diagnosis not present

## 2019-06-07 NOTE — Progress Notes (Signed)
This patient returns to the office for evaluation and treatment of long thick painful nails .  This patient is unable to trim his own nails since the patient cannot reach his feet.  Patient says the nails are painful walking and wearing his shoes.  He returns for preventive foot care services.  General Appearance  Alert, conversant and in no acute stress.  Vascular  Dorsalis pedis and posterior tibial  pulses are palpable  bilaterally.  Capillary return is within normal limits  bilaterally. Temperature is within normal limits  bilaterally.  Neurologic  Senn-Weinstein monofilament wire test within normal limits  bilaterally. Muscle power within normal limits bilaterally.  Nails Thick disfigured discolored nails with subungual debris  from hallux to fifth toes bilaterally. No evidence of bacterial infection or drainage bilaterally.  Orthopedic  No limitations of motion  feet .  No crepitus or effusions noted.  No bony pathology or digital deformities noted.  Skin  normotropic skin with no porokeratosis noted bilaterally.  No signs of infections or ulcers noted.     Onychomycosis  Pain in toes right foot  Pain in toes left foot  Debridement  of nails  1-5  B/L with a nail nipper.  Nails were then filed using a dremel tool with no incidents.    RTC  3 months    Neysha Criado DPM  

## 2019-06-08 ENCOUNTER — Telehealth: Payer: Self-pay | Admitting: Neurology

## 2019-06-08 NOTE — Telephone Encounter (Signed)
As long as they aren't protein supplements, it should be ok

## 2019-06-08 NOTE — Telephone Encounter (Signed)
Left detail message with Dr Doristine Devoid recommendations, and for patient to call back if any questions.

## 2019-06-08 NOTE — Telephone Encounter (Signed)
Patients wife wants to know if patient can take Carbidopa-levodopa with morning supplements or if it needs to be taken separately? Please call.

## 2019-06-09 DIAGNOSIS — R2689 Other abnormalities of gait and mobility: Secondary | ICD-10-CM | POA: Diagnosis not present

## 2019-06-12 DIAGNOSIS — C61 Malignant neoplasm of prostate: Secondary | ICD-10-CM | POA: Diagnosis not present

## 2019-06-12 DIAGNOSIS — I2510 Atherosclerotic heart disease of native coronary artery without angina pectoris: Secondary | ICD-10-CM | POA: Diagnosis not present

## 2019-06-12 DIAGNOSIS — J40 Bronchitis, not specified as acute or chronic: Secondary | ICD-10-CM | POA: Diagnosis not present

## 2019-06-12 DIAGNOSIS — Z8546 Personal history of malignant neoplasm of prostate: Secondary | ICD-10-CM | POA: Diagnosis not present

## 2019-06-12 DIAGNOSIS — I480 Paroxysmal atrial fibrillation: Secondary | ICD-10-CM | POA: Diagnosis not present

## 2019-06-12 DIAGNOSIS — I10 Essential (primary) hypertension: Secondary | ICD-10-CM | POA: Diagnosis not present

## 2019-06-12 DIAGNOSIS — E7800 Pure hypercholesterolemia, unspecified: Secondary | ICD-10-CM | POA: Diagnosis not present

## 2019-06-12 DIAGNOSIS — E119 Type 2 diabetes mellitus without complications: Secondary | ICD-10-CM | POA: Diagnosis not present

## 2019-06-12 DIAGNOSIS — E1142 Type 2 diabetes mellitus with diabetic polyneuropathy: Secondary | ICD-10-CM | POA: Diagnosis not present

## 2019-06-12 DIAGNOSIS — I25709 Atherosclerosis of coronary artery bypass graft(s), unspecified, with unspecified angina pectoris: Secondary | ICD-10-CM | POA: Diagnosis not present

## 2019-06-14 DIAGNOSIS — Z85828 Personal history of other malignant neoplasm of skin: Secondary | ICD-10-CM | POA: Diagnosis not present

## 2019-06-14 DIAGNOSIS — D229 Melanocytic nevi, unspecified: Secondary | ICD-10-CM | POA: Diagnosis not present

## 2019-06-14 DIAGNOSIS — L905 Scar conditions and fibrosis of skin: Secondary | ICD-10-CM | POA: Diagnosis not present

## 2019-06-14 DIAGNOSIS — R2689 Other abnormalities of gait and mobility: Secondary | ICD-10-CM | POA: Diagnosis not present

## 2019-06-14 DIAGNOSIS — L570 Actinic keratosis: Secondary | ICD-10-CM | POA: Diagnosis not present

## 2019-06-14 DIAGNOSIS — L821 Other seborrheic keratosis: Secondary | ICD-10-CM | POA: Diagnosis not present

## 2019-06-16 DIAGNOSIS — R2689 Other abnormalities of gait and mobility: Secondary | ICD-10-CM | POA: Diagnosis not present

## 2019-07-21 DIAGNOSIS — E1142 Type 2 diabetes mellitus with diabetic polyneuropathy: Secondary | ICD-10-CM | POA: Diagnosis not present

## 2019-07-21 DIAGNOSIS — E119 Type 2 diabetes mellitus without complications: Secondary | ICD-10-CM | POA: Diagnosis not present

## 2019-07-21 DIAGNOSIS — I25709 Atherosclerosis of coronary artery bypass graft(s), unspecified, with unspecified angina pectoris: Secondary | ICD-10-CM | POA: Diagnosis not present

## 2019-07-21 DIAGNOSIS — I10 Essential (primary) hypertension: Secondary | ICD-10-CM | POA: Diagnosis not present

## 2019-07-21 DIAGNOSIS — I480 Paroxysmal atrial fibrillation: Secondary | ICD-10-CM | POA: Diagnosis not present

## 2019-07-21 DIAGNOSIS — Z8546 Personal history of malignant neoplasm of prostate: Secondary | ICD-10-CM | POA: Diagnosis not present

## 2019-07-21 DIAGNOSIS — C61 Malignant neoplasm of prostate: Secondary | ICD-10-CM | POA: Diagnosis not present

## 2019-07-21 DIAGNOSIS — J40 Bronchitis, not specified as acute or chronic: Secondary | ICD-10-CM | POA: Diagnosis not present

## 2019-07-21 DIAGNOSIS — E7800 Pure hypercholesterolemia, unspecified: Secondary | ICD-10-CM | POA: Diagnosis not present

## 2019-07-21 DIAGNOSIS — I2510 Atherosclerotic heart disease of native coronary artery without angina pectoris: Secondary | ICD-10-CM | POA: Diagnosis not present

## 2019-07-27 DIAGNOSIS — C61 Malignant neoplasm of prostate: Secondary | ICD-10-CM | POA: Diagnosis not present

## 2019-08-03 DIAGNOSIS — C61 Malignant neoplasm of prostate: Secondary | ICD-10-CM | POA: Diagnosis not present

## 2019-08-17 ENCOUNTER — Telehealth: Payer: Self-pay | Admitting: Neurology

## 2019-08-17 NOTE — Telephone Encounter (Signed)
Patient's wife has questions about [employer] steady boxing classes. Please call

## 2019-08-18 DIAGNOSIS — H6123 Impacted cerumen, bilateral: Secondary | ICD-10-CM | POA: Diagnosis not present

## 2019-08-18 NOTE — Telephone Encounter (Signed)
Spoke with patients wife who states their friend sees Dr Tat for parkinson's disease and he goes to [facility]. She wanted to know if Dr Tat was ok with the patient going here because the instructors are trained and everyone is vaccinated and they feel safe there.   Spoke with Dr Tat and she stated [facility] is the same place. The Gym just has its own name because each gym is operated and owned independently.   Patients wife stated she will contact the gym and sign up for classes with Melanee Left at [place] steady boxing.

## 2019-09-07 DIAGNOSIS — E119 Type 2 diabetes mellitus without complications: Secondary | ICD-10-CM | POA: Diagnosis not present

## 2019-09-07 DIAGNOSIS — C61 Malignant neoplasm of prostate: Secondary | ICD-10-CM | POA: Diagnosis not present

## 2019-09-07 DIAGNOSIS — J40 Bronchitis, not specified as acute or chronic: Secondary | ICD-10-CM | POA: Diagnosis not present

## 2019-09-07 DIAGNOSIS — I25709 Atherosclerosis of coronary artery bypass graft(s), unspecified, with unspecified angina pectoris: Secondary | ICD-10-CM | POA: Diagnosis not present

## 2019-09-07 DIAGNOSIS — E1142 Type 2 diabetes mellitus with diabetic polyneuropathy: Secondary | ICD-10-CM | POA: Diagnosis not present

## 2019-09-07 DIAGNOSIS — Z8546 Personal history of malignant neoplasm of prostate: Secondary | ICD-10-CM | POA: Diagnosis not present

## 2019-09-07 DIAGNOSIS — E7800 Pure hypercholesterolemia, unspecified: Secondary | ICD-10-CM | POA: Diagnosis not present

## 2019-09-07 DIAGNOSIS — I2510 Atherosclerotic heart disease of native coronary artery without angina pectoris: Secondary | ICD-10-CM | POA: Diagnosis not present

## 2019-09-07 DIAGNOSIS — I10 Essential (primary) hypertension: Secondary | ICD-10-CM | POA: Diagnosis not present

## 2019-09-07 DIAGNOSIS — I480 Paroxysmal atrial fibrillation: Secondary | ICD-10-CM | POA: Diagnosis not present

## 2019-09-13 ENCOUNTER — Ambulatory Visit (INDEPENDENT_AMBULATORY_CARE_PROVIDER_SITE_OTHER): Payer: Medicare Other | Admitting: Podiatry

## 2019-09-13 ENCOUNTER — Encounter: Payer: Self-pay | Admitting: Podiatry

## 2019-09-13 ENCOUNTER — Other Ambulatory Visit: Payer: Self-pay

## 2019-09-13 DIAGNOSIS — M79676 Pain in unspecified toe(s): Secondary | ICD-10-CM

## 2019-09-13 DIAGNOSIS — B351 Tinea unguium: Secondary | ICD-10-CM

## 2019-09-13 NOTE — Progress Notes (Signed)
This patient returns to the office for evaluation and treatment of long thick painful nails .  This patient is unable to trim his own nails since the patient cannot reach his feet.  Patient says the nails are painful walking and wearing his shoes.  He returns for preventive foot care services.  General Appearance  Alert, conversant and in no acute stress.  Vascular  Dorsalis pedis and posterior tibial  pulses are palpable  bilaterally.  Capillary return is within normal limits  bilaterally. Temperature is within normal limits  bilaterally.  Neurologic  Senn-Weinstein monofilament wire test within normal limits  bilaterally. Muscle power within normal limits bilaterally.  Nails Thick disfigured discolored nails with subungual debris  from hallux to fifth toes bilaterally. No evidence of bacterial infection or drainage bilaterally.  Orthopedic  No limitations of motion  feet .  No crepitus or effusions noted.  No bony pathology or digital deformities noted.  Skin  normotropic skin with no porokeratosis noted bilaterally.  No signs of infections or ulcers noted.     Onychomycosis  Pain in toes right foot  Pain in toes left foot  Debridement  of nails  1-5  B/L with a nail nipper.  Nails were then filed using a dremel tool with no incidents.    RTC  3 months    Mena Lienau DPM  

## 2019-09-26 ENCOUNTER — Ambulatory Visit: Payer: Medicare Other

## 2019-09-27 DIAGNOSIS — Z23 Encounter for immunization: Secondary | ICD-10-CM | POA: Diagnosis not present

## 2019-09-29 NOTE — Progress Notes (Signed)
Assessment/Plan:   1.  ET/PD   -DaTscan abnormal with evidence of dopamine loss  -Continue carbidopa/levodopa 25/100, 1 tablet 3 times per day  -has had cramping at night but doesn't want to add CR carbidopa/levodopa right now.  Will monitor  -answered many questions to the best of my ability  2.  Gait disorder, multifactorial  -Likely from peripheral neuropathy, cerebellar outflow tracts from essential tremor and potentially even Parkinson's.  -discussed that foot numbness from PN and not from Parkinsons Disease    Subjective:   Johnny Fox was seen today in follow up for ET/Parkinsons disease.  This patient is accompanied in the office by his spouse who supplements the history.My previous records were reviewed prior to todays visit as well as outside records available to me.  Levodopa initiated last visit.  Patient reports that he is tolerating the medication and he thinks that tremor is better.  pt denies falls.  Pt denies lightheadedness, near syncope.  No hallucinations.  Mood has been good.  He is doing RSB 2 days per week and is doing well with that.  Got hearing aids since our last visit.  Has some "foot numbness."  Wonders if from Parkinsons Disease.  Has had RBD sx's in the past but better now.  Noting some cramping at night but not consistent.  Current prescribed movement disorder medications: Carbidopa/levodopa 25/100, 1 tablet 3 times per day (started last visit)   PREVIOUS MEDICATIONS: Sinemet  ALLERGIES:   Allergies  Allergen Reactions  . Chocolate Flavor   . Food Other (See Comments)    Chocolate-Sneezing    CURRENT MEDICATIONS:  Outpatient Encounter Medications as of 10/03/2019  Medication Sig  . aspirin EC 81 MG tablet Take 1 tablet (81 mg total) by mouth daily.  . Benfotiamine 150 MG CAPS Take 2 capsules by mouth daily.  . carbidopa-levodopa (SINEMET IR) 25-100 MG tablet Take 1 tablet by mouth 3 (three) times daily.  . cholecalciferol (VITAMIN D)  1000 UNITS tablet Take 1,000 Units by mouth daily.  . Coenzyme Q10 (COQ-10) 100 MG CAPS Take 1 capsule by mouth daily.  . Cyanocobalamin (B-12) 5000 MCG CAPS Take 1 tablet by mouth daily.  . nitroGLYCERIN (NITROSTAT) 0.4 MG SL tablet Place 1 tablet (0.4 mg total) under the tongue every 5 (five) minutes as needed for chest pain.  . Omega-3 Fatty Acids (FISH OIL) 1000 MG CAPS Take 1,000 mg by mouth 2 (two) times daily after a meal.   . rosuvastatin (CRESTOR) 5 MG tablet Take 5 mg by mouth. Take one tablet by mouth Monday through Friday.  . TURMERIC PO Take 1 capsule by mouth 2 (two) times daily.   . valsartan (DIOVAN) 40 MG tablet Takes 1/2 tablet daily   No facility-administered encounter medications on file as of 10/03/2019.    Objective:   PHYSICAL EXAMINATION:    VITALS:   Vitals:   10/03/19 1044  BP: (!) 152/83  Pulse: 88  SpO2: 98%  Weight: 176 lb (79.8 kg)  Height: 5\' 10"  (1.778 m)    GEN:  The patient appears stated age and is in NAD. HEENT:  Normocephalic, atraumatic.  The mucous membranes are moist. The superficial temporal arteries are without ropiness or tenderness. CV:  RRR Lungs:  CTAB Neck/HEME:  There are no carotid bruits bilaterally.  Neurological examination:  Orientation: The patient is alert and oriented x3. Cranial nerves: There is good facial symmetry with facial hypomimia. The speech is fluent and clear. Soft palate  rises symmetrically and there is no tongue deviation. Hearing is intact to conversational tone. Sensation: Sensation is intact to light touch throughout Motor: Strength is at least antigravity x4.  Movement examination: Tone: pt has trouble relaxing to adequately determining Abnormal movements: no rest tremor.  Mild postural tremor Coordination:  There is no decremation with RAM's, with any form of RAMS, including alternating supination and pronation of the forearm, hand opening and closing, finger taps, heel taps and toe taps. Gait and  Station: The patient has no difficulty arising out of a deep-seated chair without the use of the hands. The patient's stride length is good.     Total time spent on today's visit was 30 minutes, including both face-to-face time and nonface-to-face time.  Time included that spent on review of records (prior notes available to me/labs/imaging if pertinent), discussing treatment and goals, answering patient's questions and coordinating care.  Cc:  Josetta Huddle, MD

## 2019-10-03 ENCOUNTER — Other Ambulatory Visit: Payer: Self-pay

## 2019-10-03 ENCOUNTER — Encounter: Payer: Self-pay | Admitting: Neurology

## 2019-10-03 ENCOUNTER — Ambulatory Visit: Payer: Medicare Other | Attending: Internal Medicine

## 2019-10-03 ENCOUNTER — Ambulatory Visit (INDEPENDENT_AMBULATORY_CARE_PROVIDER_SITE_OTHER): Payer: Medicare Other | Admitting: Neurology

## 2019-10-03 VITALS — BP 152/83 | HR 88 | Ht 70.0 in | Wt 176.0 lb

## 2019-10-03 DIAGNOSIS — Z23 Encounter for immunization: Secondary | ICD-10-CM

## 2019-10-03 DIAGNOSIS — G20 Parkinson's disease: Secondary | ICD-10-CM | POA: Diagnosis not present

## 2019-10-03 DIAGNOSIS — I2510 Atherosclerotic heart disease of native coronary artery without angina pectoris: Secondary | ICD-10-CM

## 2019-10-03 MED ORDER — CARBIDOPA-LEVODOPA 25-100 MG PO TABS: 1.0000 | ORAL_TABLET | Freq: Three times a day (TID) | ORAL | 1 refills | Status: DC

## 2019-10-03 NOTE — Progress Notes (Signed)
   Covid-19 Vaccination Clinic  Name:  Johnny Fox    MRN: [id number] DOB: 1928/09/03  10/03/2019  [name] was observed post Covid-19 immunization for 15 minutes without incident. He was provided with Vaccine Information Sheet and instruction to access the V-Safe system.   [name] was instructed to call 911 with any severe reactions post vaccine: Marland Kitchen Difficulty breathing  . Swelling of face and throat  . A fast heartbeat  . A bad rash all over body  . Dizziness and weakness

## 2019-10-03 NOTE — Patient Instructions (Addendum)
You look great!    The physicians and staff at [facility] are committed to providing excellent care. You may receive a survey requesting feedback about your experience at our office. We strive to receive "very good" responses to the survey questions. If you feel that your experience would prevent you from giving the office a "very good " response, please contact our office to try to remedy the situation. We may be reached at [phone]. Thank you for taking the time out of your busy day to complete the survey.  

## 2019-10-30 DIAGNOSIS — E1142 Type 2 diabetes mellitus with diabetic polyneuropathy: Secondary | ICD-10-CM | POA: Diagnosis not present

## 2019-10-30 DIAGNOSIS — R251 Tremor, unspecified: Secondary | ICD-10-CM | POA: Diagnosis not present

## 2019-10-30 DIAGNOSIS — I2510 Atherosclerotic heart disease of native coronary artery without angina pectoris: Secondary | ICD-10-CM | POA: Diagnosis not present

## 2019-10-30 DIAGNOSIS — Z8546 Personal history of malignant neoplasm of prostate: Secondary | ICD-10-CM | POA: Diagnosis not present

## 2019-10-30 DIAGNOSIS — E7800 Pure hypercholesterolemia, unspecified: Secondary | ICD-10-CM | POA: Diagnosis not present

## 2019-10-30 DIAGNOSIS — R258 Other abnormal involuntary movements: Secondary | ICD-10-CM | POA: Diagnosis not present

## 2019-10-30 DIAGNOSIS — I10 Essential (primary) hypertension: Secondary | ICD-10-CM | POA: Diagnosis not present

## 2019-10-30 DIAGNOSIS — E1165 Type 2 diabetes mellitus with hyperglycemia: Secondary | ICD-10-CM | POA: Diagnosis not present

## 2019-10-30 DIAGNOSIS — E559 Vitamin D deficiency, unspecified: Secondary | ICD-10-CM | POA: Diagnosis not present

## 2019-10-30 DIAGNOSIS — R5383 Other fatigue: Secondary | ICD-10-CM | POA: Diagnosis not present

## 2019-10-30 DIAGNOSIS — R2689 Other abnormalities of gait and mobility: Secondary | ICD-10-CM | POA: Diagnosis not present

## 2019-10-30 DIAGNOSIS — R600 Localized edema: Secondary | ICD-10-CM | POA: Diagnosis not present

## 2019-11-08 ENCOUNTER — Ambulatory Visit: Payer: Medicare Other | Admitting: Neurology

## 2019-11-10 DIAGNOSIS — M25511 Pain in right shoulder: Secondary | ICD-10-CM | POA: Diagnosis not present

## 2019-11-13 DIAGNOSIS — E7800 Pure hypercholesterolemia, unspecified: Secondary | ICD-10-CM | POA: Diagnosis not present

## 2019-11-13 DIAGNOSIS — I10 Essential (primary) hypertension: Secondary | ICD-10-CM | POA: Diagnosis not present

## 2019-11-13 DIAGNOSIS — C61 Malignant neoplasm of prostate: Secondary | ICD-10-CM | POA: Diagnosis not present

## 2019-11-13 DIAGNOSIS — I2510 Atherosclerotic heart disease of native coronary artery without angina pectoris: Secondary | ICD-10-CM | POA: Diagnosis not present

## 2019-11-13 DIAGNOSIS — E1142 Type 2 diabetes mellitus with diabetic polyneuropathy: Secondary | ICD-10-CM | POA: Diagnosis not present

## 2019-11-13 DIAGNOSIS — I480 Paroxysmal atrial fibrillation: Secondary | ICD-10-CM | POA: Diagnosis not present

## 2019-11-13 DIAGNOSIS — J40 Bronchitis, not specified as acute or chronic: Secondary | ICD-10-CM | POA: Diagnosis not present

## 2019-11-13 DIAGNOSIS — I25709 Atherosclerosis of coronary artery bypass graft(s), unspecified, with unspecified angina pectoris: Secondary | ICD-10-CM | POA: Diagnosis not present

## 2019-11-13 DIAGNOSIS — Z8546 Personal history of malignant neoplasm of prostate: Secondary | ICD-10-CM | POA: Diagnosis not present

## 2019-12-13 ENCOUNTER — Ambulatory Visit (INDEPENDENT_AMBULATORY_CARE_PROVIDER_SITE_OTHER): Payer: Medicare Other | Admitting: Podiatry

## 2019-12-13 ENCOUNTER — Other Ambulatory Visit: Payer: Self-pay

## 2019-12-13 ENCOUNTER — Encounter: Payer: Self-pay | Admitting: Podiatry

## 2019-12-13 DIAGNOSIS — B351 Tinea unguium: Secondary | ICD-10-CM

## 2019-12-13 DIAGNOSIS — M79676 Pain in unspecified toe(s): Secondary | ICD-10-CM

## 2019-12-13 NOTE — Progress Notes (Signed)
This patient returns to the office for evaluation and treatment of long thick painful nails .  This patient is unable to trim his own nails since the patient cannot reach his feet.  Patient says the nails are painful walking and wearing his shoes.  He returns for preventive foot care services.  General Appearance  Alert, conversant and in no acute stress.  Vascular  Dorsalis pedis and posterior tibial  pulses are palpable  bilaterally.  Capillary return is within normal limits  bilaterally. Temperature is within normal limits  bilaterally.  Neurologic  Senn-Weinstein monofilament wire test within normal limits  bilaterally. Muscle power within normal limits bilaterally.  Nails Thick disfigured discolored nails with subungual debris  from hallux to fifth toes bilaterally. No evidence of bacterial infection or drainage bilaterally.  Orthopedic  No limitations of motion  feet .  No crepitus or effusions noted.  No bony pathology or digital deformities noted.  Skin  normotropic skin with no porokeratosis noted bilaterally.  No signs of infections or ulcers noted.     Onychomycosis  Pain in toes right foot  Pain in toes left foot  Debridement  of nails  1-5  B/L with a nail nipper.  Nails were then filed using a dremel tool with no incidents.    RTC  3 months    Kylii Ennis DPM  

## 2019-12-14 DIAGNOSIS — L819 Disorder of pigmentation, unspecified: Secondary | ICD-10-CM | POA: Diagnosis not present

## 2019-12-14 DIAGNOSIS — D229 Melanocytic nevi, unspecified: Secondary | ICD-10-CM | POA: Diagnosis not present

## 2019-12-14 DIAGNOSIS — Z85828 Personal history of other malignant neoplasm of skin: Secondary | ICD-10-CM | POA: Diagnosis not present

## 2019-12-14 DIAGNOSIS — L905 Scar conditions and fibrosis of skin: Secondary | ICD-10-CM | POA: Diagnosis not present

## 2019-12-14 DIAGNOSIS — D485 Neoplasm of uncertain behavior of skin: Secondary | ICD-10-CM | POA: Diagnosis not present

## 2019-12-14 DIAGNOSIS — L570 Actinic keratosis: Secondary | ICD-10-CM | POA: Diagnosis not present

## 2019-12-14 DIAGNOSIS — L812 Freckles: Secondary | ICD-10-CM | POA: Diagnosis not present

## 2019-12-14 DIAGNOSIS — D0439 Carcinoma in situ of skin of other parts of face: Secondary | ICD-10-CM | POA: Diagnosis not present

## 2019-12-14 DIAGNOSIS — I951 Orthostatic hypotension: Secondary | ICD-10-CM | POA: Diagnosis not present

## 2019-12-14 DIAGNOSIS — L814 Other melanin hyperpigmentation: Secondary | ICD-10-CM | POA: Diagnosis not present

## 2019-12-14 DIAGNOSIS — L821 Other seborrheic keratosis: Secondary | ICD-10-CM | POA: Diagnosis not present

## 2019-12-14 DIAGNOSIS — D1801 Hemangioma of skin and subcutaneous tissue: Secondary | ICD-10-CM | POA: Diagnosis not present

## 2019-12-22 DIAGNOSIS — R0602 Shortness of breath: Secondary | ICD-10-CM | POA: Diagnosis not present

## 2019-12-22 DIAGNOSIS — I10 Essential (primary) hypertension: Secondary | ICD-10-CM | POA: Diagnosis not present

## 2019-12-22 DIAGNOSIS — I2510 Atherosclerotic heart disease of native coronary artery without angina pectoris: Secondary | ICD-10-CM | POA: Diagnosis not present

## 2019-12-22 DIAGNOSIS — I951 Orthostatic hypotension: Secondary | ICD-10-CM | POA: Diagnosis not present

## 2020-01-04 DIAGNOSIS — E7800 Pure hypercholesterolemia, unspecified: Secondary | ICD-10-CM | POA: Diagnosis not present

## 2020-01-04 DIAGNOSIS — I10 Essential (primary) hypertension: Secondary | ICD-10-CM | POA: Diagnosis not present

## 2020-01-04 DIAGNOSIS — G20 Parkinson's disease: Secondary | ICD-10-CM | POA: Diagnosis not present

## 2020-01-04 DIAGNOSIS — J40 Bronchitis, not specified as acute or chronic: Secondary | ICD-10-CM | POA: Diagnosis not present

## 2020-01-04 DIAGNOSIS — I25709 Atherosclerosis of coronary artery bypass graft(s), unspecified, with unspecified angina pectoris: Secondary | ICD-10-CM | POA: Diagnosis not present

## 2020-01-04 DIAGNOSIS — I2510 Atherosclerotic heart disease of native coronary artery without angina pectoris: Secondary | ICD-10-CM | POA: Diagnosis not present

## 2020-01-04 DIAGNOSIS — E1142 Type 2 diabetes mellitus with diabetic polyneuropathy: Secondary | ICD-10-CM | POA: Diagnosis not present

## 2020-01-04 DIAGNOSIS — Z8546 Personal history of malignant neoplasm of prostate: Secondary | ICD-10-CM | POA: Diagnosis not present

## 2020-01-04 DIAGNOSIS — E119 Type 2 diabetes mellitus without complications: Secondary | ICD-10-CM | POA: Diagnosis not present

## 2020-01-04 DIAGNOSIS — C61 Malignant neoplasm of prostate: Secondary | ICD-10-CM | POA: Diagnosis not present

## 2020-01-04 DIAGNOSIS — I480 Paroxysmal atrial fibrillation: Secondary | ICD-10-CM | POA: Diagnosis not present

## 2020-01-11 NOTE — Progress Notes (Signed)
Cardiology Office Note   Date:  01/12/2020   ID:  Johnny Fox, DOB [birth year], MRN [id number]  PCP:  Marden Noble, MD    No chief complaint on file.  CAD  Wt Readings from Last 3 Encounters:  01/12/20 178 lb 9.6 oz (81 kg)  10/03/19 176 lb (79.8 kg)  05/26/19 177 lb (80.3 kg)       History of Present Illness: Johnny Fox is a 87 y.o. male  who has had CABG in 2004.Prior to CABG, he was not having many symptoms. He had a routine ECG and stress test showing CAD. He had only some DOE.   Exercise limited by foot pain. He has neuropathic painand tingling. His balance is off as well. He has fallen in the past. He had a shoulder problem after a fall.  At the 2020 visit, he had hurt his right arm playing golf. He has a balance problem. He has not fallen recently.   He noted some bruising and swelling at his right bicep.  It appeared he had a biceps tendon rupture.  He was managed conservatively.  Stress test in 2021 showed:  "The left ventricular ejection fraction is hyperdynamic (>65%).  Nuclear stress EF: 68%.  There was no ST segment deviation noted during stress.  No T wave inversion was noted during stress.  Defect 1: There is a small defect of mild severity present in the mid anterior location.  Findings consistent with ischemia.  This is a low risk study.   Low risk stress nuclear study with a small area of ischemia, possibly due to a diagonal artery stenosis, otherwise normal perfusion. Normal left ventricular regional and global systolic function."  Valsartan was stopped due to low BP.  Home readings have been in the 120/70 range on average.  Stopped Parkinsons exercise program due to low BP.  Wants to restart.   Denies : Chest pain.  Leg edema. Nitroglycerin use. Orthopnea. Palpitations. Paroxysmal nocturnal dyspnea. Shortness of breath. Syncope.   He is careful to avoid falling.   Past Medical History:  Diagnosis Date  .  Actinic keratoses    and seborrheic keratoses  . Arrhythmia    afib postoperatively after CABG in 2004, No recurrence  . Coronary artery disease   . Diabetes mellitus without complication (HCC)    diet controlled  . Dyslipidemia   . ED (erectile dysfunction)   . Elevated PSA    2010, workup with Dr. Isabel Caprice  . H/O prostate biopsy   . Hypertension   . Onychomycosis   . Prostate cancer (HCC)    PSA's run approx 7- 2011-2013  . Right shoulder injury    july 2014, Dr. Francena Hanly  . Tick bite of abdomen    2013, treated with 3wks of doxycycline, tick engorged 3.68mm nodule left thyroid 4/14- seen on carotid u/s    Past Surgical History:  Procedure Laterality Date  . CARDIAC SURGERY    . CATARACT EXTRACTION, BILATERAL    . CORONARY ARTERY BYPASS GRAFT  2004     Current Outpatient Medications  Medication Sig Dispense Refill  . acetaminophen (TYLENOL) 325 MG tablet 1 tablet as needed    . Ascorbic Acid (VITAMIN C) 1000 MG tablet 1 tablet    . aspirin EC 81 MG tablet Take 1 tablet (81 mg total) by mouth daily. 90 tablet 3  . Benfotiamine 150 MG CAPS Take 2 capsules by mouth daily.    . carbidopa-levodopa (SINEMET IR) 25-100 MG  tablet Take 1 tablet by mouth 3 (three) times daily. 270 tablet 1  . cholecalciferol (VITAMIN D) 1000 UNITS tablet Take 1,000 Units by mouth daily.    . Coenzyme Q10 (COQ-10) 100 MG CAPS Take 1 capsule by mouth daily.    . Cyanocobalamin (B-12) 5000 MCG CAPS Take 1 tablet by mouth daily.    . naproxen sodium (ALEVE) 220 MG tablet 1 tablet with food or milk as needed    . nitroGLYCERIN (NITROSTAT) 0.4 MG SL tablet Place 1 tablet (0.4 mg total) under the tongue every 5 (five) minutes as needed for chest pain. 25 tablet 3  . Omega-3 Fatty Acids (FISH OIL) 1000 MG CAPS Take 1,000 mg by mouth 2 (two) times daily after a meal.     . rosuvastatin (CRESTOR) 5 MG tablet Take 5 mg by mouth. Take one tablet by mouth Monday through Friday.    . TURMERIC PO Take 1 capsule  by mouth 2 (two) times daily.      No current facility-administered medications for this visit.    Allergies:   Chocolate flavor and Food    Social History:  The patient  reports that he has never smoked. He has never used smokeless tobacco. He reports that he does not drink alcohol and does not use drugs.   Family History:  The patient's family history includes Breast cancer in his child; CAD in his father; CVA in his father; Diabetes in his brother; Heart attack in his brother; Hypertension in his father; Stroke in his mother and sister.    ROS:  Please see the history of present illness.   Otherwise, review of systems are positive for some imbalance with walking-dizziness with standing.   All other systems are reviewed and negative.    PHYSICAL EXAM: VS:  BP 122/68   Pulse 84   Ht 5\' 10"  (1.778 m)   Wt 178 lb 9.6 oz (81 kg)   SpO2 98%   BMI 25.63 kg/m  , BMI Body mass index is 25.63 kg/m. GEN: Well nourished, well developed, in no acute distress  HEENT: left forehead scar from MOHS surgery Neck: no JVD, carotid bruits, or masses Cardiac: RRR; no murmurs, rubs, or gallops,no edema  Respiratory:  clear to auscultation bilaterally, normal work of breathing GI: soft, nontender, nondistended, + BS MS: no deformity or atrophy  Skin: warm and dry, no rash Neuro:  Strength and sensation are intact Psych: euthymic mood, full affect   EKG:   The ekg ordered today demonstrates NSR, RBBB   Recent Labs: No results found for requested labs within last 8760 hours.   Lipid Panel No results found for: CHOL, TRIG, HDL, CHOLHDL, VLDL, LDLCALC, LDLDIRECT   Other studies Reviewed: Additional studies/ records that were reviewed today with results demonstrating: LDL 40 in 2/21.  A1C 6.2 in 10/21.   ASSESSMENT AND PLAN:  1. CAD: Low risk stress test in 2021.  No angina.  Continue aggressive secondary prevention.  Refill SL NTG.  2. HTN: BPs controlled off of ARB- 120s/70s at home.   Diagnosed with Parkinsons.  Now with orthostasis so BP meds were stopped.  H ewill let us know if readings increase.    3. Hyperlipidemia: The current medical regimen is effective;  continue present plan and medications.  Whole food plant based diet.  4. OK to go back to Parkinsons exercise class.  I stressed the importance of avoiding falls.    Current medicines are reviewed at length with the patient  today.  The patient concerns regarding his medicines were addressed.  The following changes have been made:  Refill NTG  Labs/ tests ordered today include:  No orders of the defined types were placed in this encounter.   Recommend 150 minutes/week of aerobic exercise Low fat, low carb, high fiber diet recommended  Disposition:   FU in 1 year   Signed, Lance Muss, MD  01/12/2020 3:56 PM    [facility] [address] [place], [place], [place]  [address] Phone: [phone]; Fax: [phone]

## 2020-01-12 ENCOUNTER — Ambulatory Visit (INDEPENDENT_AMBULATORY_CARE_PROVIDER_SITE_OTHER): Payer: Medicare Other | Admitting: Interventional Cardiology

## 2020-01-12 ENCOUNTER — Encounter: Payer: Self-pay | Admitting: Interventional Cardiology

## 2020-01-12 ENCOUNTER — Other Ambulatory Visit: Payer: Self-pay

## 2020-01-12 VITALS — BP 122/68 | HR 84 | Ht 70.0 in | Wt 178.6 lb

## 2020-01-12 DIAGNOSIS — I2510 Atherosclerotic heart disease of native coronary artery without angina pectoris: Secondary | ICD-10-CM

## 2020-01-12 DIAGNOSIS — I10 Essential (primary) hypertension: Secondary | ICD-10-CM

## 2020-01-12 DIAGNOSIS — E7800 Pure hypercholesterolemia, unspecified: Secondary | ICD-10-CM

## 2020-01-12 MED ORDER — NITROGLYCERIN 0.4 MG SL SUBL: 0.4000 mg | SUBLINGUAL_TABLET | SUBLINGUAL | 3 refills | Status: DC | PRN

## 2020-01-12 NOTE — Patient Instructions (Addendum)
Medication Instructions:  *If you need a refill on your cardiac medications before your next appointment, please call your pharmacy*  Lab Work: If you have labs (blood work) drawn today and your tests are completely normal, you will receive your results only by: . MyChart Message (if you have MyChart) OR . A paper copy in the mail If you have any lab test that is abnormal or we need to change your treatment, we will call you to review the results.  Testing/Procedures: None ordered today.   Follow-Up: At [facility], you and your health needs are our priority.  As part of our continuing mission to provide you with exceptional heart care, we have created designated Provider Care Teams.  These Care Teams include your primary Cardiologist (physician) and Advanced Practice Providers (APPs -  Physician Assistants and Nurse Practitioners) who all work together to provide you with the care you need, when you need it.  We recommend signing up for the patient portal called "MyChart".  Sign up information is provided on this After Visit Summary.  MyChart is used to connect with patients for Virtual Visits (Telemedicine).  Patients are able to view lab/test results, encounter notes, upcoming appointments, etc.  Non-urgent messages can be sent to your provider as well.   To learn more about what you can do with MyChart, go to [email or web address].    Your next appointment:   12 month(s)  The format for your next appointment:   In Person  Provider:   You may see Jayadeep Varanasi, MD or one of the following Advanced Practice Providers on your designated Care Team:    Dayna Dunn, PA-C  Michele Lenze, PA-C    High-Fiber Diet Fiber, also called dietary fiber, is a type of carbohydrate that is found in fruits, vegetables, whole grains, and beans. A high-fiber diet can have many health benefits. Your health care provider may recommend a high-fiber diet to help:  Prevent constipation. Fiber  can make your bowel movements more regular.  Lower your cholesterol.  Relieve the following conditions: ? Swelling of veins in the anus (hemorrhoids). ? Swelling and irritation (inflammation) of specific areas of the digestive tract (uncomplicated diverticulosis). ? A problem of the large intestine (colon) that sometimes causes pain and diarrhea (irritable bowel syndrome, IBS).  Prevent overeating as part of a weight-loss plan.  Prevent heart disease, type 2 diabetes, and certain cancers. What is my plan? The recommended daily fiber intake in grams (g) includes:  38 g for men age 50 or younger.  30 g for men over age 50.  25 g for women age 50 or younger.  21 g for women over age 50. You can get the recommended daily intake of dietary fiber by:  Eating a variety of fruits, vegetables, grains, and beans.  Taking a fiber supplement, if it is not possible to get enough fiber through your diet. What do I need to know about a high-fiber diet?  It is better to get fiber through food sources rather than from fiber supplements. There is not a lot of research about how effective supplements are.  Always check the fiber content on the nutrition facts label of any prepackaged food. Look for foods that contain 5 g of fiber or more per serving.  Talk with a diet and nutrition specialist (dietitian) if you have questions about specific foods that are recommended or not recommended for your medical condition, especially if those foods are not listed below.  Gradually increase how much   fiber you consume. If you increase your intake of dietary fiber too quickly, you may have bloating, cramping, or gas.  Drink plenty of water. Water helps you to digest fiber. What are tips for following this plan?  Eat a wide variety of high-fiber foods.  Make sure that half of the grains that you eat each day are whole grains.  Eat breads and cereals that are made with whole-grain flour instead of refined  flour or white flour.  Eat brown rice, bulgur wheat, or millet instead of white rice.  Start the day with a breakfast that is high in fiber, such as a cereal that contains 5 g of fiber or more per serving.  Use beans in place of meat in soups, salads, and pasta dishes.  Eat high-fiber snacks, such as berries, raw vegetables, nuts, and popcorn.  Choose whole fruits and vegetables instead of processed forms like juice or sauce. What foods can I eat?  Fruits Berries. Pears. Apples. Oranges. Avocado. Prunes and raisins. Dried figs. Vegetables Sweet potatoes. Spinach. Kale. Artichokes. Cabbage. Broccoli. Cauliflower. Green peas. Carrots. Squash. Grains Whole-grain breads. Multigrain cereal. Oats and oatmeal. Brown rice. Barley. Bulgur wheat. Greens [place]. Quinoa. Bran muffins. Popcorn. Rye wafer crackers. Meats and other proteins Navy, kidney, and pinto beans. Soybeans. Split peas. Lentils. Nuts and seeds. Dairy Fiber-fortified yogurt. Beverages Fiber-fortified soy milk. Fiber-fortified orange juice. Other foods Fiber bars. The items listed above may not be a complete list of recommended foods and beverages. Contact a dietitian for more options. What foods are not recommended? Fruits Fruit juice. Cooked, strained fruit. Vegetables Fried potatoes. Canned vegetables. Well-cooked vegetables. Grains White bread. Pasta made with refined flour. White rice. Meats and other proteins Fatty cuts of meat. Fried chicken or fried fish. Dairy Milk. Yogurt. Cream cheese. Sour cream. Fats and oils Butters. Beverages Soft drinks. Other foods Cakes and pastries. The items listed above may not be a complete list of foods and beverages to avoid. Contact a dietitian for more information. Summary  Fiber is a type of carbohydrate. It is found in fruits, vegetables, whole grains, and beans.  There are many health benefits of eating a high-fiber diet, such as preventing constipation, lowering blood  cholesterol, helping with weight loss, and reducing your risk of heart disease, diabetes, and certain cancers.  Gradually increase your intake of fiber. Increasing too fast can result in cramping, bloating, and gas. Drink plenty of water while you increase your fiber.  The best sources of fiber include whole fruits and vegetables, whole grains, nuts, seeds, and beans. This information is not intended to replace advice given to you by your health care provider. Make sure you discuss any questions you have with your health care provider. Document Revised: 10/26/2016 Document Reviewed: 10/26/2016 Elsevier Patient Education  2020 [employer].

## 2020-01-24 DIAGNOSIS — C61 Malignant neoplasm of prostate: Secondary | ICD-10-CM | POA: Diagnosis not present

## 2020-01-31 DIAGNOSIS — C61 Malignant neoplasm of prostate: Secondary | ICD-10-CM | POA: Diagnosis not present

## 2020-01-31 DIAGNOSIS — R351 Nocturia: Secondary | ICD-10-CM | POA: Diagnosis not present

## 2020-02-07 ENCOUNTER — Ambulatory Visit: Payer: Medicare Other | Admitting: Interventional Cardiology

## 2020-02-08 DIAGNOSIS — L738 Other specified follicular disorders: Secondary | ICD-10-CM | POA: Diagnosis not present

## 2020-02-08 DIAGNOSIS — D485 Neoplasm of uncertain behavior of skin: Secondary | ICD-10-CM | POA: Diagnosis not present

## 2020-02-08 DIAGNOSIS — C4442 Squamous cell carcinoma of skin of scalp and neck: Secondary | ICD-10-CM | POA: Diagnosis not present

## 2020-02-08 DIAGNOSIS — L821 Other seborrheic keratosis: Secondary | ICD-10-CM | POA: Diagnosis not present

## 2020-02-28 DIAGNOSIS — D0439 Carcinoma in situ of skin of other parts of face: Secondary | ICD-10-CM | POA: Diagnosis not present

## 2020-03-01 DIAGNOSIS — I2510 Atherosclerotic heart disease of native coronary artery without angina pectoris: Secondary | ICD-10-CM | POA: Diagnosis not present

## 2020-03-01 DIAGNOSIS — E7800 Pure hypercholesterolemia, unspecified: Secondary | ICD-10-CM | POA: Diagnosis not present

## 2020-03-01 DIAGNOSIS — E1142 Type 2 diabetes mellitus with diabetic polyneuropathy: Secondary | ICD-10-CM | POA: Diagnosis not present

## 2020-03-01 DIAGNOSIS — E559 Vitamin D deficiency, unspecified: Secondary | ICD-10-CM | POA: Diagnosis not present

## 2020-03-01 DIAGNOSIS — E538 Deficiency of other specified B group vitamins: Secondary | ICD-10-CM | POA: Diagnosis not present

## 2020-03-01 DIAGNOSIS — R600 Localized edema: Secondary | ICD-10-CM | POA: Diagnosis not present

## 2020-03-01 DIAGNOSIS — Z8546 Personal history of malignant neoplasm of prostate: Secondary | ICD-10-CM | POA: Diagnosis not present

## 2020-03-01 DIAGNOSIS — Z0000 Encounter for general adult medical examination without abnormal findings: Secondary | ICD-10-CM | POA: Diagnosis not present

## 2020-03-01 DIAGNOSIS — I480 Paroxysmal atrial fibrillation: Secondary | ICD-10-CM | POA: Diagnosis not present

## 2020-03-01 DIAGNOSIS — E1165 Type 2 diabetes mellitus with hyperglycemia: Secondary | ICD-10-CM | POA: Diagnosis not present

## 2020-03-01 DIAGNOSIS — I10 Essential (primary) hypertension: Secondary | ICD-10-CM | POA: Diagnosis not present

## 2020-03-01 DIAGNOSIS — G20 Parkinson's disease: Secondary | ICD-10-CM | POA: Diagnosis not present

## 2020-03-04 DIAGNOSIS — C61 Malignant neoplasm of prostate: Secondary | ICD-10-CM | POA: Diagnosis not present

## 2020-03-04 DIAGNOSIS — G20 Parkinson's disease: Secondary | ICD-10-CM | POA: Diagnosis not present

## 2020-03-04 DIAGNOSIS — I2510 Atherosclerotic heart disease of native coronary artery without angina pectoris: Secondary | ICD-10-CM | POA: Diagnosis not present

## 2020-03-04 DIAGNOSIS — I480 Paroxysmal atrial fibrillation: Secondary | ICD-10-CM | POA: Diagnosis not present

## 2020-03-04 DIAGNOSIS — Z8546 Personal history of malignant neoplasm of prostate: Secondary | ICD-10-CM | POA: Diagnosis not present

## 2020-03-04 DIAGNOSIS — E7800 Pure hypercholesterolemia, unspecified: Secondary | ICD-10-CM | POA: Diagnosis not present

## 2020-03-04 DIAGNOSIS — I10 Essential (primary) hypertension: Secondary | ICD-10-CM | POA: Diagnosis not present

## 2020-03-04 DIAGNOSIS — E119 Type 2 diabetes mellitus without complications: Secondary | ICD-10-CM | POA: Diagnosis not present

## 2020-03-05 DIAGNOSIS — I2510 Atherosclerotic heart disease of native coronary artery without angina pectoris: Secondary | ICD-10-CM | POA: Diagnosis not present

## 2020-03-05 DIAGNOSIS — E119 Type 2 diabetes mellitus without complications: Secondary | ICD-10-CM | POA: Diagnosis not present

## 2020-03-05 DIAGNOSIS — J40 Bronchitis, not specified as acute or chronic: Secondary | ICD-10-CM | POA: Diagnosis not present

## 2020-03-05 DIAGNOSIS — I10 Essential (primary) hypertension: Secondary | ICD-10-CM | POA: Diagnosis not present

## 2020-03-05 DIAGNOSIS — I25709 Atherosclerosis of coronary artery bypass graft(s), unspecified, with unspecified angina pectoris: Secondary | ICD-10-CM | POA: Diagnosis not present

## 2020-03-05 DIAGNOSIS — G20 Parkinson's disease: Secondary | ICD-10-CM | POA: Diagnosis not present

## 2020-03-05 DIAGNOSIS — E7800 Pure hypercholesterolemia, unspecified: Secondary | ICD-10-CM | POA: Diagnosis not present

## 2020-03-05 DIAGNOSIS — I480 Paroxysmal atrial fibrillation: Secondary | ICD-10-CM | POA: Diagnosis not present

## 2020-03-05 DIAGNOSIS — E1142 Type 2 diabetes mellitus with diabetic polyneuropathy: Secondary | ICD-10-CM | POA: Diagnosis not present

## 2020-03-13 ENCOUNTER — Other Ambulatory Visit: Payer: Self-pay

## 2020-03-13 ENCOUNTER — Ambulatory Visit (INDEPENDENT_AMBULATORY_CARE_PROVIDER_SITE_OTHER): Payer: Medicare Other | Admitting: Podiatry

## 2020-03-13 ENCOUNTER — Encounter: Payer: Self-pay | Admitting: Podiatry

## 2020-03-13 DIAGNOSIS — H9192 Unspecified hearing loss, left ear: Secondary | ICD-10-CM | POA: Insufficient documentation

## 2020-03-13 DIAGNOSIS — R600 Localized edema: Secondary | ICD-10-CM | POA: Insufficient documentation

## 2020-03-13 DIAGNOSIS — G20 Parkinson's disease: Secondary | ICD-10-CM | POA: Insufficient documentation

## 2020-03-13 DIAGNOSIS — H6120 Impacted cerumen, unspecified ear: Secondary | ICD-10-CM | POA: Insufficient documentation

## 2020-03-13 DIAGNOSIS — M545 Low back pain, unspecified: Secondary | ICD-10-CM | POA: Insufficient documentation

## 2020-03-13 DIAGNOSIS — R0600 Dyspnea, unspecified: Secondary | ICD-10-CM | POA: Insufficient documentation

## 2020-03-13 DIAGNOSIS — G629 Polyneuropathy, unspecified: Secondary | ICD-10-CM | POA: Insufficient documentation

## 2020-03-13 DIAGNOSIS — R059 Cough, unspecified: Secondary | ICD-10-CM | POA: Insufficient documentation

## 2020-03-13 DIAGNOSIS — R269 Unspecified abnormalities of gait and mobility: Secondary | ICD-10-CM | POA: Insufficient documentation

## 2020-03-13 DIAGNOSIS — B351 Tinea unguium: Secondary | ICD-10-CM

## 2020-03-13 DIAGNOSIS — E559 Vitamin D deficiency, unspecified: Secondary | ICD-10-CM | POA: Insufficient documentation

## 2020-03-13 DIAGNOSIS — Z0000 Encounter for general adult medical examination without abnormal findings: Secondary | ICD-10-CM | POA: Insufficient documentation

## 2020-03-13 DIAGNOSIS — R972 Elevated prostate specific antigen [PSA]: Secondary | ICD-10-CM | POA: Insufficient documentation

## 2020-03-13 DIAGNOSIS — R42 Dizziness and giddiness: Secondary | ICD-10-CM | POA: Insufficient documentation

## 2020-03-13 DIAGNOSIS — R202 Paresthesia of skin: Secondary | ICD-10-CM | POA: Insufficient documentation

## 2020-03-13 DIAGNOSIS — J069 Acute upper respiratory infection, unspecified: Secondary | ICD-10-CM | POA: Insufficient documentation

## 2020-03-13 DIAGNOSIS — E119 Type 2 diabetes mellitus without complications: Secondary | ICD-10-CM | POA: Insufficient documentation

## 2020-03-13 DIAGNOSIS — Z951 Presence of aortocoronary bypass graft: Secondary | ICD-10-CM | POA: Insufficient documentation

## 2020-03-13 DIAGNOSIS — R7989 Other specified abnormal findings of blood chemistry: Secondary | ICD-10-CM | POA: Insufficient documentation

## 2020-03-13 DIAGNOSIS — M79676 Pain in unspecified toe(s): Secondary | ICD-10-CM | POA: Diagnosis not present

## 2020-03-13 DIAGNOSIS — M25519 Pain in unspecified shoulder: Secondary | ICD-10-CM | POA: Insufficient documentation

## 2020-03-13 DIAGNOSIS — Z79899 Other long term (current) drug therapy: Secondary | ICD-10-CM | POA: Insufficient documentation

## 2020-03-13 DIAGNOSIS — S0181XA Laceration without foreign body of other part of head, initial encounter: Secondary | ICD-10-CM | POA: Insufficient documentation

## 2020-03-13 DIAGNOSIS — S73191A Other sprain of right hip, initial encounter: Secondary | ICD-10-CM | POA: Insufficient documentation

## 2020-03-13 DIAGNOSIS — R209 Unspecified disturbances of skin sensation: Secondary | ICD-10-CM | POA: Insufficient documentation

## 2020-03-13 DIAGNOSIS — Z8546 Personal history of malignant neoplasm of prostate: Secondary | ICD-10-CM | POA: Insufficient documentation

## 2020-03-13 DIAGNOSIS — N489 Disorder of penis, unspecified: Secondary | ICD-10-CM | POA: Insufficient documentation

## 2020-03-13 DIAGNOSIS — M5431 Sciatica, right side: Secondary | ICD-10-CM | POA: Insufficient documentation

## 2020-03-13 DIAGNOSIS — R252 Cramp and spasm: Secondary | ICD-10-CM | POA: Insufficient documentation

## 2020-03-13 DIAGNOSIS — E1142 Type 2 diabetes mellitus with diabetic polyneuropathy: Secondary | ICD-10-CM | POA: Insufficient documentation

## 2020-03-13 DIAGNOSIS — C61 Malignant neoplasm of prostate: Secondary | ICD-10-CM | POA: Insufficient documentation

## 2020-03-13 DIAGNOSIS — I951 Orthostatic hypotension: Secondary | ICD-10-CM | POA: Insufficient documentation

## 2020-03-13 DIAGNOSIS — I480 Paroxysmal atrial fibrillation: Secondary | ICD-10-CM | POA: Insufficient documentation

## 2020-03-13 DIAGNOSIS — J40 Bronchitis, not specified as acute or chronic: Secondary | ICD-10-CM | POA: Insufficient documentation

## 2020-03-13 NOTE — Progress Notes (Signed)
This patient returns to my office for at risk foot care.  This patient requires this care by a professional since this patient will be at risk due to having  Diabetic neuropathy and Parkinsons.  This patient is unable to cut nails himself since the patient cannot reach his nails.These nails are painful walking and wearing shoes.  This patient presents for at risk foot care today.  General Appearance  Alert, conversant and in no acute stress.  Vascular  Dorsalis pedis and posterior tibial  pulses are palpable  bilaterally.  Capillary return is within normal limits  bilaterally. Temperature is within normal limits  bilaterally.  Neurologic  Senn-Weinstein monofilament wire test within normal limits  bilaterally. Muscle power within normal limits bilaterally.  Nails Thick disfigured discolored nails with subungual debris  from hallux to fifth toes bilaterally. No evidence of bacterial infection or drainage bilaterally.  Orthopedic  No limitations of motion  feet .  No crepitus or effusions noted.  No bony pathology or digital deformities noted.  Skin  normotropic skin with no porokeratosis noted bilaterally.  No signs of infections or ulcers noted.     Onychomycosis  Pain in right toes  Pain in left toes  Consent was obtained for treatment procedures.   Mechanical debridement of nails 1-5  bilaterally performed with a nail nipper.  Filed with dremel without incident.    Return office visit   3 months                   Told patient to return for periodic foot care and evaluation due to potential at risk complications.   Gardiner Barefoot DPM

## 2020-03-14 DIAGNOSIS — D0439 Carcinoma in situ of skin of other parts of face: Secondary | ICD-10-CM | POA: Diagnosis not present

## 2020-04-04 NOTE — Progress Notes (Signed)
Assessment/Plan:   1.  ET/PD   -DaTscan abnormal with evidence of dopamine loss  -Pt would like to see if changing to the CR would help fatigue.  We will d/c the IR and start carbidopa/levodopa 25/100 CR, 1 tablet 3 times per day.  Told him that we likely will need dose increase next visit  -has some q hs cramping but not enough to start bedtime levodopa CR yet.  2.  Gait disorder, multifactorial  -Likely from peripheral neuropathy, cerebellar outflow tracts from essential tremor and potentially even Parkinson's.  -discussed that foot numbness from PN and not from Parkinsons Disease   3.  Constipation  -discussed nature and pathophysiology and association with PD  -discussed importance of hydration.  Pt is to increase water intake  -pt is given a copy of the rancho recipe  -recommended daily colace  -recommended miralax prn    Subjective:   Johnny Fox was seen today in follow up for ET/Parkinsons disease.  This patient is accompanied in the office by his spouse who supplements the history.  Wife with patient and supplements hx.  My previous records were reviewed prior to todays visit as well as outside records available to me, including podiatry records and cardiology records.  Pt denies falls.  In regards to lower extremity cramping, the patient states that it "occasionally" happens - 1 time per week in bed.   pt denies lightheadedness, near syncope.  No hallucinations.  Mood has been good.  Goes to RSB one time per week only due to "lack of energy."  Current prescribed movement disorder medications: Carbidopa/levodopa 25/100, 1 tablet 3 times per day    PREVIOUS MEDICATIONS: Sinemet  ALLERGIES:   Allergies  Allergen Reactions  . Chocolate Flavor   . Food Other (See Comments)    Chocolate-Sneezing    CURRENT MEDICATIONS:  Outpatient Encounter Medications as of 04/08/2020  Medication Sig  . acetaminophen (TYLENOL) 325 MG tablet 1 tablet as needed  . Ascorbic  Acid (VITAMIN C) 1000 MG tablet 1 tablet  . aspirin EC 81 MG tablet Take 1 tablet (81 mg total) by mouth daily.  . Benfotiamine 150 MG CAPS Take 2 capsules by mouth daily.  . carbidopa-levodopa (SINEMET IR) 25-100 MG tablet Take 1 tablet by mouth 3 (three) times daily.  . cholecalciferol (VITAMIN D) 1000 UNITS tablet Take 1,000 Units by mouth daily.  . Coenzyme Q10 (COQ-10) 100 MG CAPS Take 1 capsule by mouth daily.  . Cyanocobalamin (B-12) 5000 MCG CAPS Take 1 tablet by mouth daily.  . naproxen sodium (ALEVE) 220 MG tablet 1 tablet with food or milk as needed  . nitroGLYCERIN (NITROSTAT) 0.4 MG SL tablet Place 1 tablet (0.4 mg total) under the tongue every 5 (five) minutes as needed for chest pain.  . Omega-3 Fatty Acids (FISH OIL) 1000 MG CAPS Take 1,000 mg by mouth 2 (two) times daily after a meal.   . rosuvastatin (CRESTOR) 5 MG tablet Take 5 mg by mouth. Take one tablet by mouth Monday through Friday.  . TURMERIC PO Take 1 capsule by mouth 2 (two) times daily.   . [DISCONTINUED] furosemide (LASIX) 20 MG tablet 1/2 tablet (Patient not taking: Reported on 04/08/2020)  . [DISCONTINUED] valsartan (DIOVAN) 40 MG tablet TAKE 1/2 TABLET ONCE A DAY FOR BLOOD PRESSURE (Patient not taking: Reported on 04/08/2020)   No facility-administered encounter medications on file as of 04/08/2020.    Objective:   PHYSICAL EXAMINATION:    VITALS:  Vitals:   04/08/20 1108  BP: 124/74  Pulse: 86  SpO2: 98%  Weight: 180 lb (81.6 kg)  Height: 5\' 10"  (1.778 m)    GEN:  The patient appears stated age and is in NAD. HEENT:  Normocephalic, atraumatic.  The mucous membranes are moist. The superficial temporal arteries are without ropiness or tenderness. CV:  RRR Lungs:  CTAB Neck/HEME:  There are no carotid bruits bilaterally.  Neurological examination:  Orientation: The patient is alert and oriented x3. Cranial nerves: There is good facial symmetry with facial hypomimia. The speech is fluent and clear.  Soft palate rises symmetrically and there is no tongue deviation. Hearing is intact to conversational tone. Sensation: Sensation is intact to light touch throughout Motor: Strength is at least antigravity x4.  Movement examination: Tone: Mild increased tone in the left upper extremity. Abnormal movements: no rest tremor.  Mild postural tremor Coordination:  There is mild decremation with RAM's, with any form of RAMS, including alternating supination and pronation of the forearm, hand opening and closing, finger taps, heel taps and toe taps on the left. Gait and Station: The patient has difficulty arising out of a deep-seated chair without the use of the hands and uses his hands to push off. The patient's stride length is good.     Total time spent on today's visit was 30 minutes, including both face-to-face time and nonface-to-face time.  Time included that spent on review of records (prior notes available to me/labs/imaging if pertinent), discussing treatment and goals, answering patient's questions and coordinating care.  Cc:  Josetta Huddle, MD

## 2020-04-08 ENCOUNTER — Telehealth: Payer: Self-pay

## 2020-04-08 ENCOUNTER — Encounter: Payer: Self-pay | Admitting: Neurology

## 2020-04-08 ENCOUNTER — Other Ambulatory Visit: Payer: Self-pay

## 2020-04-08 ENCOUNTER — Ambulatory Visit (INDEPENDENT_AMBULATORY_CARE_PROVIDER_SITE_OTHER): Payer: Medicare Other | Admitting: Neurology

## 2020-04-08 VITALS — BP 124/74 | HR 86 | Ht 70.0 in | Wt 180.0 lb

## 2020-04-08 DIAGNOSIS — G20 Parkinson's disease: Secondary | ICD-10-CM

## 2020-04-08 DIAGNOSIS — R5383 Other fatigue: Secondary | ICD-10-CM | POA: Diagnosis not present

## 2020-04-08 DIAGNOSIS — I2510 Atherosclerotic heart disease of native coronary artery without angina pectoris: Secondary | ICD-10-CM

## 2020-04-08 MED ORDER — CARBIDOPA-LEVODOPA ER 25-100 MG PO TBCR: 1.0000 | EXTENDED_RELEASE_TABLET | Freq: Three times a day (TID) | ORAL | 1 refills | Status: DC

## 2020-04-08 NOTE — Patient Instructions (Signed)
STOP your carbidopa/levodopa 25/100 IR and START taking carbidopa/levodopa 25/100 CR at 7am/11am/4pm  Constipation and Parkinson's disease:  1.Rancho recipe for constipation in Parkinsons Disease:  -1 cup of unprocessed bran (need to get this at [employer], [employer] or similar type of store), 2 cups of applesauce in 1 cup of prune juice 2.  Increase fiber intake (Metamucil,vegetables) 3.  Regular, moderate exercise can be beneficial. 4.  Avoid medications causing constipation, such as medications like antacids with calcium or magnesium 5.  It's okay to take daily Miralax, and taper if stools become too loose or you experience diarrhea 6.  Stool softeners (Colace) can help with chronic constipation and I recommend you take this daily. 7.  Increase water intake.  You should be drinking 1/2 gallon of water a day as long as you have not been diagnosed with congestive heart failure or renal/kidney failure.  This is probably the single greatest thing that you can do to help your constipation.  Online Resources for Power over Parkinson's Group March 2022  . Local [place] Online Groups  o Power over [employer] Group :   - Power Over Parkinson's Patient Education Group will be Wednesday, March 9th at 2pm via Zoom.   - Upcoming Power over Parkinson's Meetings:  2nd Wednesdays of the month at 2 pm:       April 13th, May 11th - Contact Amy Marriott at [email or web address] if interested in participating in this online group o Parkinson's Care Partners Group:    3rd Mondays, Contact Corwin Levins o Atypical Parkinsonian Patient Group:   4th Wednesdays, Contact Corwin Levins o If you are interested in participating in these online groups with Judson Roch, please contact her directly for how to join those meetings.  Her contact information is [email or web address].  She will send you a link to join the [employer].  (Please note that Corwin Levins , MSW, LCSW, has resigned her position  at [employer] [facility], but will continue to lead the online groups temporarily)  . [place]:  [email or web address] o PD Health at Home continues:  Mindfulness Mondays, Expert Briefing Tuesdays, Wellness Wednesdays, Take Time Thursdays, Fitness Fridays -Listings for March 2022 are on the website o Upcoming Webinar:  Conversations about Complementary Therapies and PD.  Wednesday, March 2nd @ 1 pm o Upcoming Webinar:  Can We Put the Brakes on PD Progression?  Wednesday, April 6th @ 1 pm o Geneticist, molecular) at [email or web address] o  Please check out their website to sign up for emails and see their full online offerings  . [place]:  [email or web address]  o Upcoming Webinar:   Trouble Sleeping?  What to Know about Acting out Dreams and other Sleep Issues.  Thursday, March 17th @ 12 noon o Check out additional information on their website to see their full online offerings  . [place]:  [email or web address] o Upcoming Webinar:  Women and Parkinson's.  Tuesday, March 8th at 2 pm o Care Partner Monthly Meetup.  With 11-29-1990 [place].  First Tuesday of each month, 2 pm o Check out additional information to Live Well Today on their website  . Parkinson and Movement Disorders (PMD) Alliance:  www.pmdalliance.org o [employer] Online:  Online Education Events o Sign up for emails, which are sent weekly to give you updates on programming and online offerings   . Parkinson's Association of the [place]:  www.parkinsonassociation.org o Information on online support groups, education events, and online exercises including Yoga, Parkinson's exercises and more-LOTS of  information on links to PD resources and online events o Virtual Support Group through Parkinson's Association of the [place]; next one is scheduled for Wednesday, April 10, 2020 at 2 pm. (These are typically scheduled for the 1st Wednesday of the month at  2 pm).  Visit website for details.  . Additional links for movement activities: o PWR! Moves Classes at [place] RESUMED!  Wednesdays 10 and 11 am.  Contact Amy Marriott, PT [email or web address] or [phone] if interested o Here is a link to the PWR!Moves classes on Zoom from [place] - Daily Mon-Sat at 10:00. Via Zoom, FREE and open to all.  There is also a link below via Facebook if you use that platform. - [email or web address].si - [email or web address] o Parkinson's Wellness Recovery (PWR! Moves)  www.[email or web address] - Info on the PWR! Virtual Experience:  You will have access to our expertise through self-assessment, guided plans that start with the PD-specific fundamentals, educational content, tips, Q&A with an expert, and a growing Art therapist of PD-specific pre-recorded and live exercise classes of varying types and intensity - both physical and cognitive! If that is not enough, we offer 1:1 wellness consultations (in-person or virtual) to personalize your PWR! Research scientist (medical).  - Check out the PWR! Move of the month on the [place] [facility] website:  [email or web address] o [employer] Fridays:  - As part of the PD Health @ Home program, this free video series focuses each week on one aspect of fitness designed to support people living with Parkinson's.  These weekly videos highlight the [place] recent fitness guidelines for people with Parkinson's disease. -  [email or web address].dk o Dance for PD website is offering free, live-stream classes throughout the week, as well as links to [employer] of classes:   [email or web address] o Dance for Parkinson's Class:  [place].  Free offering for people with Parkinson's and care partners; virtual class.  o For more information, contact [phone] or email Ruffin Frederick at [email or web address] o Virtual dance and Pilates for Parkinson's classes: Click on the Community Tab> Parkinson's Movement Initiative Tab.  To register for classes and for more information, visit [email or web address] and click the "community" tab.     o [employer] Parkinson's Cycling Classes  - [facility] [employer]: 1pm on Fridays-Live classes at [facility] [employer] at [email or web address] or [phone]) [facility] [employer]: Virtual Classes Mondays and Thursdays (contact [place] at [email or web address] or [phone])   o [place] levels of classes are offered Tuesdays and Thursdays:  10:30 am,  12 noon & 1:45 pm at [facility].  - Active Stretching with Paula Compton Class starting in March, on Fridays - To observe a class or for  more information, call [phone] or email [email or web address] . [employer]: o Chief Technology Officer Opportunities:  www.well-springsolutions.org/caregiver-education/caregiver-support-group.  You may also contact Vickki Muff at [email or web address] or [phone].   o Virtual Caregiver Retreat, Monday, February 21st, 3-5 pm o Powerful Tools for Caregivers, 6-wk series for caregivers, in-person, Thursdays March 10, 17, 24, 31 and April 7, 14, from 10:30-12:30 at 11-22-1969, [place].  Contact [address] (see above) to register o Well-Spring Navigator:  Just1Navigator program, a free service to help individuals and families through the journey of determining care for older adults.  The "Navigator" is a Vickki Muff, Education officer, museum, who will speak with a prospective client and/or loved  ones to provide an assessment of the situation and a set of recommendations for  a personalized care plan -- all free of charge, and whether [facility] [employer] offers the needed service or not. If the need is not a service we provide, we are well-connected with reputable programs in town that we can refer you to.  www.well-springsolutions.org or to speak with the Navigator, call [phone].

## 2020-04-08 NOTE — Telephone Encounter (Signed)
Received a message from after service answering service call from the pharmacy wanting to verify carbiopa levodopa instructions. He states the patient was previously on IR and is now on the CR. He wanted to make sure that was correct. He states the copay is about $174 dollars. He states we may need to do a tier override to see if it will help lower the cost.

## 2020-04-10 NOTE — Telephone Encounter (Signed)
Per phone call with insurance patient actually does not have any prescription plan coverage. It is only for medical has he does not have Medicare part D or prescription coverage with the supplement. (REF#: [id number]) Therefore I can not complete any PA's or Tier exceptions for him. I would maybe check with the Rep for the medication and see if there is a foundation program or something for him.

## 2020-04-11 NOTE — Telephone Encounter (Signed)
He should try good RX

## 2020-04-11 NOTE — Telephone Encounter (Addendum)
Spoke with Mrs Rathbone and informed her that I spoke with the pharmacist at [facility] and he told me the medication would cost $174.   I spoke with patients wife and she states she will contact the pharmacy and see if they can use the good RX coupon to save money.   Advised wife that if rx is too expensive to call around to the smaller pharmacies and if the rx is cheaper let us know and we may be able to send it there to help save them some money. Wife voiced understanding and thanked me for calling.

## 2020-04-13 DIAGNOSIS — I2510 Atherosclerotic heart disease of native coronary artery without angina pectoris: Secondary | ICD-10-CM | POA: Diagnosis not present

## 2020-04-13 DIAGNOSIS — E119 Type 2 diabetes mellitus without complications: Secondary | ICD-10-CM | POA: Diagnosis not present

## 2020-04-13 DIAGNOSIS — I480 Paroxysmal atrial fibrillation: Secondary | ICD-10-CM | POA: Diagnosis not present

## 2020-04-13 DIAGNOSIS — G20 Parkinson's disease: Secondary | ICD-10-CM | POA: Diagnosis not present

## 2020-04-13 DIAGNOSIS — J40 Bronchitis, not specified as acute or chronic: Secondary | ICD-10-CM | POA: Diagnosis not present

## 2020-04-13 DIAGNOSIS — I25709 Atherosclerosis of coronary artery bypass graft(s), unspecified, with unspecified angina pectoris: Secondary | ICD-10-CM | POA: Diagnosis not present

## 2020-04-13 DIAGNOSIS — E7800 Pure hypercholesterolemia, unspecified: Secondary | ICD-10-CM | POA: Diagnosis not present

## 2020-04-13 DIAGNOSIS — C61 Malignant neoplasm of prostate: Secondary | ICD-10-CM | POA: Diagnosis not present

## 2020-04-13 DIAGNOSIS — Z8546 Personal history of malignant neoplasm of prostate: Secondary | ICD-10-CM | POA: Diagnosis not present

## 2020-04-13 DIAGNOSIS — I10 Essential (primary) hypertension: Secondary | ICD-10-CM | POA: Diagnosis not present

## 2020-04-22 DIAGNOSIS — Z23 Encounter for immunization: Secondary | ICD-10-CM | POA: Diagnosis not present

## 2020-04-24 DIAGNOSIS — H903 Sensorineural hearing loss, bilateral: Secondary | ICD-10-CM | POA: Diagnosis not present

## 2020-04-24 DIAGNOSIS — H6123 Impacted cerumen, bilateral: Secondary | ICD-10-CM | POA: Diagnosis not present

## 2020-05-13 DIAGNOSIS — R0602 Shortness of breath: Secondary | ICD-10-CM | POA: Diagnosis not present

## 2020-05-13 DIAGNOSIS — G20 Parkinson's disease: Secondary | ICD-10-CM | POA: Diagnosis not present

## 2020-05-13 DIAGNOSIS — I10 Essential (primary) hypertension: Secondary | ICD-10-CM | POA: Diagnosis not present

## 2020-05-13 DIAGNOSIS — Z8546 Personal history of malignant neoplasm of prostate: Secondary | ICD-10-CM | POA: Diagnosis not present

## 2020-05-13 DIAGNOSIS — E7800 Pure hypercholesterolemia, unspecified: Secondary | ICD-10-CM | POA: Diagnosis not present

## 2020-05-13 DIAGNOSIS — I2510 Atherosclerotic heart disease of native coronary artery without angina pectoris: Secondary | ICD-10-CM | POA: Diagnosis not present

## 2020-05-13 DIAGNOSIS — I480 Paroxysmal atrial fibrillation: Secondary | ICD-10-CM | POA: Diagnosis not present

## 2020-05-13 DIAGNOSIS — R600 Localized edema: Secondary | ICD-10-CM | POA: Diagnosis not present

## 2020-05-13 DIAGNOSIS — E1142 Type 2 diabetes mellitus with diabetic polyneuropathy: Secondary | ICD-10-CM | POA: Diagnosis not present

## 2020-05-13 DIAGNOSIS — E559 Vitamin D deficiency, unspecified: Secondary | ICD-10-CM | POA: Diagnosis not present

## 2020-05-22 DIAGNOSIS — H35033 Hypertensive retinopathy, bilateral: Secondary | ICD-10-CM | POA: Diagnosis not present

## 2020-05-22 DIAGNOSIS — H52223 Regular astigmatism, bilateral: Secondary | ICD-10-CM | POA: Diagnosis not present

## 2020-05-22 DIAGNOSIS — H35419 Lattice degeneration of retina, unspecified eye: Secondary | ICD-10-CM | POA: Diagnosis not present

## 2020-05-22 DIAGNOSIS — H43813 Vitreous degeneration, bilateral: Secondary | ICD-10-CM | POA: Diagnosis not present

## 2020-05-22 DIAGNOSIS — I10 Essential (primary) hypertension: Secondary | ICD-10-CM | POA: Diagnosis not present

## 2020-05-22 DIAGNOSIS — H43393 Other vitreous opacities, bilateral: Secondary | ICD-10-CM | POA: Diagnosis not present

## 2020-05-22 DIAGNOSIS — Z961 Presence of intraocular lens: Secondary | ICD-10-CM | POA: Diagnosis not present

## 2020-05-22 DIAGNOSIS — Z9849 Cataract extraction status, unspecified eye: Secondary | ICD-10-CM | POA: Diagnosis not present

## 2020-05-22 DIAGNOSIS — H524 Presbyopia: Secondary | ICD-10-CM | POA: Diagnosis not present

## 2020-05-22 DIAGNOSIS — H5203 Hypermetropia, bilateral: Secondary | ICD-10-CM | POA: Diagnosis not present

## 2020-06-13 DIAGNOSIS — E119 Type 2 diabetes mellitus without complications: Secondary | ICD-10-CM | POA: Diagnosis not present

## 2020-06-13 DIAGNOSIS — I25709 Atherosclerosis of coronary artery bypass graft(s), unspecified, with unspecified angina pectoris: Secondary | ICD-10-CM | POA: Diagnosis not present

## 2020-06-13 DIAGNOSIS — E7800 Pure hypercholesterolemia, unspecified: Secondary | ICD-10-CM | POA: Diagnosis not present

## 2020-06-13 DIAGNOSIS — I10 Essential (primary) hypertension: Secondary | ICD-10-CM | POA: Diagnosis not present

## 2020-06-13 DIAGNOSIS — I480 Paroxysmal atrial fibrillation: Secondary | ICD-10-CM | POA: Diagnosis not present

## 2020-06-13 DIAGNOSIS — G20 Parkinson's disease: Secondary | ICD-10-CM | POA: Diagnosis not present

## 2020-06-13 DIAGNOSIS — I2510 Atherosclerotic heart disease of native coronary artery without angina pectoris: Secondary | ICD-10-CM | POA: Diagnosis not present

## 2020-06-13 DIAGNOSIS — J40 Bronchitis, not specified as acute or chronic: Secondary | ICD-10-CM | POA: Diagnosis not present

## 2020-06-13 DIAGNOSIS — E1142 Type 2 diabetes mellitus with diabetic polyneuropathy: Secondary | ICD-10-CM | POA: Diagnosis not present

## 2020-06-19 ENCOUNTER — Other Ambulatory Visit: Payer: Self-pay

## 2020-06-19 ENCOUNTER — Ambulatory Visit (INDEPENDENT_AMBULATORY_CARE_PROVIDER_SITE_OTHER): Payer: Medicare Other | Admitting: Podiatry

## 2020-06-19 ENCOUNTER — Encounter: Payer: Self-pay | Admitting: Podiatry

## 2020-06-19 DIAGNOSIS — E119 Type 2 diabetes mellitus without complications: Secondary | ICD-10-CM | POA: Diagnosis not present

## 2020-06-19 DIAGNOSIS — G629 Polyneuropathy, unspecified: Secondary | ICD-10-CM

## 2020-06-19 DIAGNOSIS — B351 Tinea unguium: Secondary | ICD-10-CM

## 2020-06-19 DIAGNOSIS — M79676 Pain in unspecified toe(s): Secondary | ICD-10-CM

## 2020-06-19 NOTE — Progress Notes (Signed)
This patient returns to my office for at risk foot care.  This patient requires this care by a professional since this patient will be at risk due to having  Diabetic neuropathy and Parkinsons.  This patient is unable to cut nails himself since the patient cannot reach his nails.These nails are painful walking and wearing shoes.  This patient presents for at risk foot care today.  General Appearance  Alert, conversant and in no acute stress.  Vascular  Dorsalis pedis and posterior tibial  pulses are palpable  bilaterally.  Capillary return is within normal limits  bilaterally. Temperature is within normal limits  bilaterally.  Neurologic  Senn-Weinstein monofilament wire test within normal limits  bilaterally. Muscle power within normal limits bilaterally.  Nails Thick disfigured discolored nails with subungual debris  from hallux to fifth toes bilaterally. No evidence of bacterial infection or drainage bilaterally.  Orthopedic  No limitations of motion  feet .  No crepitus or effusions noted.  No bony pathology or digital deformities noted.  Skin  normotropic skin with no porokeratosis noted bilaterally.  No signs of infections or ulcers noted.     Onychomycosis  Pain in right toes  Pain in left toes  Consent was obtained for treatment procedures.   Mechanical debridement of nails 1-5  bilaterally performed with a nail nipper.  Filed with dremel without incident.    Return office visit   3 months                   Told patient to return for periodic foot care and evaluation due to potential at risk complications.   Gardiner Barefoot DPM

## 2020-07-26 DIAGNOSIS — C61 Malignant neoplasm of prostate: Secondary | ICD-10-CM | POA: Diagnosis not present

## 2020-08-02 DIAGNOSIS — C61 Malignant neoplasm of prostate: Secondary | ICD-10-CM | POA: Diagnosis not present

## 2020-08-02 DIAGNOSIS — N401 Enlarged prostate with lower urinary tract symptoms: Secondary | ICD-10-CM | POA: Diagnosis not present

## 2020-08-02 DIAGNOSIS — R3915 Urgency of urination: Secondary | ICD-10-CM | POA: Diagnosis not present

## 2020-08-19 DIAGNOSIS — C61 Malignant neoplasm of prostate: Secondary | ICD-10-CM | POA: Diagnosis not present

## 2020-08-19 DIAGNOSIS — G20 Parkinson's disease: Secondary | ICD-10-CM | POA: Diagnosis not present

## 2020-08-19 DIAGNOSIS — I480 Paroxysmal atrial fibrillation: Secondary | ICD-10-CM | POA: Diagnosis not present

## 2020-08-19 DIAGNOSIS — I25709 Atherosclerosis of coronary artery bypass graft(s), unspecified, with unspecified angina pectoris: Secondary | ICD-10-CM | POA: Diagnosis not present

## 2020-08-19 DIAGNOSIS — E119 Type 2 diabetes mellitus without complications: Secondary | ICD-10-CM | POA: Diagnosis not present

## 2020-08-19 DIAGNOSIS — Z8546 Personal history of malignant neoplasm of prostate: Secondary | ICD-10-CM | POA: Diagnosis not present

## 2020-08-19 DIAGNOSIS — E1142 Type 2 diabetes mellitus with diabetic polyneuropathy: Secondary | ICD-10-CM | POA: Diagnosis not present

## 2020-08-19 DIAGNOSIS — I2510 Atherosclerotic heart disease of native coronary artery without angina pectoris: Secondary | ICD-10-CM | POA: Diagnosis not present

## 2020-08-19 DIAGNOSIS — I10 Essential (primary) hypertension: Secondary | ICD-10-CM | POA: Diagnosis not present

## 2020-08-19 DIAGNOSIS — E7800 Pure hypercholesterolemia, unspecified: Secondary | ICD-10-CM | POA: Diagnosis not present

## 2020-08-19 DIAGNOSIS — J40 Bronchitis, not specified as acute or chronic: Secondary | ICD-10-CM | POA: Diagnosis not present

## 2020-08-26 DIAGNOSIS — L570 Actinic keratosis: Secondary | ICD-10-CM | POA: Diagnosis not present

## 2020-08-26 DIAGNOSIS — D485 Neoplasm of uncertain behavior of skin: Secondary | ICD-10-CM | POA: Diagnosis not present

## 2020-08-26 DIAGNOSIS — L989 Disorder of the skin and subcutaneous tissue, unspecified: Secondary | ICD-10-CM | POA: Diagnosis not present

## 2020-08-26 DIAGNOSIS — L905 Scar conditions and fibrosis of skin: Secondary | ICD-10-CM | POA: Diagnosis not present

## 2020-08-26 DIAGNOSIS — Z85828 Personal history of other malignant neoplasm of skin: Secondary | ICD-10-CM | POA: Diagnosis not present

## 2020-08-26 DIAGNOSIS — L821 Other seborrheic keratosis: Secondary | ICD-10-CM | POA: Diagnosis not present

## 2020-08-26 DIAGNOSIS — C44229 Squamous cell carcinoma of skin of left ear and external auricular canal: Secondary | ICD-10-CM | POA: Diagnosis not present

## 2020-08-27 DIAGNOSIS — I719 Aortic aneurysm of unspecified site, without rupture: Secondary | ICD-10-CM | POA: Diagnosis not present

## 2020-08-27 DIAGNOSIS — I2510 Atherosclerotic heart disease of native coronary artery without angina pectoris: Secondary | ICD-10-CM | POA: Diagnosis not present

## 2020-08-27 DIAGNOSIS — E1142 Type 2 diabetes mellitus with diabetic polyneuropathy: Secondary | ICD-10-CM | POA: Diagnosis not present

## 2020-08-27 DIAGNOSIS — G20 Parkinson's disease: Secondary | ICD-10-CM | POA: Diagnosis not present

## 2020-08-27 DIAGNOSIS — I10 Essential (primary) hypertension: Secondary | ICD-10-CM | POA: Diagnosis not present

## 2020-09-17 NOTE — Progress Notes (Signed)
Assessment/Plan:   1.  ET/PD   -DaTscan abnormal with evidence of dopamine loss  -Continue carbidopa/levodopa 25/100 CR, 1 tablet 3 times per day  -We discussed that it used to be thought that levodopa would increase risk of melanoma but now it is believed that Parkinsons itself likely increases risk of melanoma. he is to get regular skin checks.  He just went recently.    -increase exercise  -would like him to do PT.  I think he would do well with this and would like to see him in [facility] class  2.  Gait disorder, multifactorial  -Likely from peripheral neuropathy, cerebellar outflow tracts from essential tremor and potentially even Parkinson's.  -discussed that foot numbness from PN and not from Parkinsons Disease   3.  Constipation  -discussed nature and pathophysiology and association with PD  -discussed importance of hydration.  Pt is to increase water intake  -pt is given a copy of the rancho recipe  -recommended daily colace  -recommended miralax prn    Subjective:   Johnny Fox Romberg was seen today in follow up for ET/Parkinsons disease.  This patient is accompanied in the office by his spouse who supplements the history.  Wife with patient and supplements hx.  My previous records were reviewed prior to todays visit as well as outside records available to me.  Last visit, we changed from the immediate release to extended release levodopa, just to see if it would help his fatigue.  Reports today that he tolerated it well but still has the fatigue.  He reports a few naps per days but wife states only 30 min. Has had no falls.  Balance is "not good."  Doing rsb 1 day per week.  Uses walking stick with walking in park.  No lightheadedness or near syncope.  Some decreased grip.  Feet numb.    Current prescribed movement disorder medications: Carbidopa/levodopa 25/100 CR, 1 tablet 3 times per day    PREVIOUS MEDICATIONS: Sinemet (just changed to see if it help fatigue - it  didn't)  ALLERGIES:   Allergies  Allergen Reactions   Chocolate Flavor    Food Other (See Comments)    Chocolate-Sneezing    CURRENT MEDICATIONS:  Outpatient Encounter Medications as of 09/18/2020  Medication Sig   acetaminophen (TYLENOL) 325 MG tablet 1 tablet as needed   Ascorbic Acid (VITAMIN C) 1000 MG tablet 1 tablet   aspirin EC 81 MG tablet Take 1 tablet (81 mg total) by mouth daily.   Benfotiamine 150 MG CAPS Take 2 capsules by mouth daily.   Carbidopa-Levodopa ER (SINEMET CR) 25-100 MG tablet controlled release Take 1 tablet by mouth in the morning, at noon, and at bedtime. 7am/11am/4pm   cholecalciferol (VITAMIN D) 1000 UNITS tablet Take 1,000 Units by mouth daily.   Coenzyme Q10 (COQ-10) 100 MG CAPS Take 1 capsule by mouth daily.   Cyanocobalamin (B-12) 5000 MCG CAPS Take 1 tablet by mouth daily.   naproxen sodium (ALEVE) 220 MG tablet 1 tablet with food or milk as needed   nitroGLYCERIN (NITROSTAT) 0.4 MG SL tablet Place 1 tablet (0.4 mg total) under the tongue every 5 (five) minutes as needed for chest pain.   Omega-3 Fatty Acids (FISH OIL) 1000 MG CAPS Take 1,000 mg by mouth 2 (two) times daily after a meal.    rosuvastatin (CRESTOR) 5 MG tablet Take 5 mg by mouth. Take one tablet by mouth Monday through Friday.   TURMERIC PO Take  1 capsule by mouth 2 (two) times daily.    furosemide (LASIX) 20 MG tablet 1/2 tablet (Patient not taking: Reported on 09/18/2020)   No facility-administered encounter medications on file as of 09/18/2020.    Objective:   PHYSICAL EXAMINATION:    VITALS:   Vitals:   09/18/20 0931  BP: 132/68  Pulse: 72  SpO2: 97%  Weight: 175 lb 9.6 oz (79.7 kg)  Height: '5\' 10"'$  (1.778 m)     GEN:  The patient appears stated age and is in NAD. HEENT:  Normocephalic, atraumatic.  The mucous membranes are moist. The superficial temporal arteries are without ropiness or tenderness. CV:  RRR Lungs:  CTAB Neck/HEME:  There are no carotid bruits  bilaterally.  Neurological examination:  Orientation: The patient is alert and oriented x3. Cranial nerves: There is good facial symmetry with facial hypomimia. The speech is fluent and clear. Soft palate rises symmetrically and there is no tongue deviation. Hearing is decreased to conversational tone. Sensation: Sensation is intact to light touch throughout Motor: Strength is at least antigravity x4.  Movement examination: Tone: there is normal tone in the UE/LE Abnormal movements: no rest tremor.  Mild postural tremor Coordination:  There is no decremation with RAM's, with any form of RAMS, including alternating supination and pronation of the forearm, hand opening and closing, finger taps, heel taps and toe taps on the left. Gait and Station: The patient has min difficulty arising out of a deep-seated chair without the use of the hands.  He is able to do this on the 2nd attempt.  The patient's stride length is good.     Total time spent on today's visit was 31 minutes, including both face-to-face time and nonface-to-face time.  Time included that spent on review of records (prior notes available to me/labs/imaging if pertinent), discussing treatment and goals, answering patient's questions and coordinating care.  Cc:  Josetta Huddle, MD

## 2020-09-18 ENCOUNTER — Encounter: Payer: Self-pay | Admitting: Neurology

## 2020-09-18 ENCOUNTER — Other Ambulatory Visit: Payer: Self-pay

## 2020-09-18 ENCOUNTER — Ambulatory Visit (INDEPENDENT_AMBULATORY_CARE_PROVIDER_SITE_OTHER): Payer: Medicare Other | Admitting: Neurology

## 2020-09-18 VITALS — BP 132/68 | HR 72 | Ht 70.0 in | Wt 175.6 lb

## 2020-09-18 DIAGNOSIS — G20 Parkinson's disease: Secondary | ICD-10-CM | POA: Diagnosis not present

## 2020-09-18 DIAGNOSIS — I2510 Atherosclerotic heart disease of native coronary artery without angina pectoris: Secondary | ICD-10-CM

## 2020-09-18 MED ORDER — CARBIDOPA-LEVODOPA ER 25-100 MG PO TBCR: 1.0000 | EXTENDED_RELEASE_TABLET | Freq: Three times a day (TID) | ORAL | 1 refills | Status: DC

## 2020-09-18 NOTE — Patient Instructions (Signed)
Constipation and Parkinson's disease:  1.Rancho recipe for constipation in Parkinsons Disease:  -1 cup of unprocessed bran (need to get this at [employer], [employer] or similar type of store), 2 cups of applesauce in 1 cup of prune juice 2.  Increase fiber intake (Metamucil,vegetables) 3.  Regular, moderate exercise can be beneficial. 4.  Avoid medications causing constipation, such as medications like antacids with calcium or magnesium 5.  It's okay to take daily Miralax, and taper if stools become too loose or you experience diarrhea 6.  Stool softeners (Colace) can help with chronic constipation and I recommend you take this daily. 7.  Increase water intake.  You should be drinking 1/2 gallon of water a day as long as you have not been diagnosed with congestive heart failure or renal/kidney failure.  This is probably the single greatest thing that you can do to help your constipation.  

## 2020-09-25 ENCOUNTER — Other Ambulatory Visit: Payer: Self-pay

## 2020-09-25 ENCOUNTER — Encounter: Payer: Self-pay | Admitting: Podiatry

## 2020-09-25 ENCOUNTER — Ambulatory Visit (INDEPENDENT_AMBULATORY_CARE_PROVIDER_SITE_OTHER): Payer: Medicare Other | Admitting: Podiatry

## 2020-09-25 DIAGNOSIS — E119 Type 2 diabetes mellitus without complications: Secondary | ICD-10-CM

## 2020-09-25 DIAGNOSIS — B351 Tinea unguium: Secondary | ICD-10-CM | POA: Diagnosis not present

## 2020-09-25 DIAGNOSIS — M79676 Pain in unspecified toe(s): Secondary | ICD-10-CM | POA: Diagnosis not present

## 2020-09-25 DIAGNOSIS — G629 Polyneuropathy, unspecified: Secondary | ICD-10-CM | POA: Diagnosis not present

## 2020-09-25 NOTE — Progress Notes (Signed)
This patient returns to my office for at risk foot care.  This patient requires this care by a professional since this patient will be at risk due to having  Diabetic neuropathy and Parkinsons.  This patient is unable to cut nails himself since the patient cannot reach his nails.These nails are painful walking and wearing shoes.  This patient presents for at risk foot care today.  General Appearance  Alert, conversant and in no acute stress.  Vascular  Dorsalis pedis and posterior tibial  pulses are palpable  bilaterally.  Capillary return is within normal limits  bilaterally. Temperature is within normal limits  bilaterally.  Neurologic  Senn-Weinstein monofilament wire test within normal limits  bilaterally. Muscle power within normal limits bilaterally.  Nails Thick disfigured discolored nails with subungual debris  from hallux to fifth toes bilaterally. No evidence of bacterial infection or drainage bilaterally.  Orthopedic  No limitations of motion  feet .  No crepitus or effusions noted.  No bony pathology or digital deformities noted.  Skin  normotropic skin with no porokeratosis noted bilaterally.  No signs of infections or ulcers noted.     Onychomycosis  Pain in right toes  Pain in left toes  Consent was obtained for treatment procedures.   Mechanical debridement of nails 1-5  bilaterally performed with a nail nipper.  Filed with dremel without incident. Iatrogenic lesion upon removing nail spicule right hallux.   Return office visit   3 months                   Told patient to return for periodic foot care and evaluation due to potential at risk complications.   Gardiner Barefoot DPM

## 2020-09-30 DIAGNOSIS — Z23 Encounter for immunization: Secondary | ICD-10-CM | POA: Diagnosis not present

## 2020-10-09 DIAGNOSIS — D0422 Carcinoma in situ of skin of left ear and external auricular canal: Secondary | ICD-10-CM | POA: Diagnosis not present

## 2020-10-14 DIAGNOSIS — Z23 Encounter for immunization: Secondary | ICD-10-CM | POA: Diagnosis not present

## 2020-12-16 ENCOUNTER — Encounter: Payer: Self-pay | Admitting: Podiatry

## 2020-12-16 ENCOUNTER — Other Ambulatory Visit: Payer: Self-pay

## 2020-12-16 ENCOUNTER — Ambulatory Visit (INDEPENDENT_AMBULATORY_CARE_PROVIDER_SITE_OTHER): Payer: Medicare Other | Admitting: Podiatry

## 2020-12-16 DIAGNOSIS — M79676 Pain in unspecified toe(s): Secondary | ICD-10-CM | POA: Diagnosis not present

## 2020-12-16 DIAGNOSIS — B351 Tinea unguium: Secondary | ICD-10-CM

## 2020-12-16 DIAGNOSIS — G629 Polyneuropathy, unspecified: Secondary | ICD-10-CM | POA: Diagnosis not present

## 2020-12-16 DIAGNOSIS — E119 Type 2 diabetes mellitus without complications: Secondary | ICD-10-CM | POA: Diagnosis not present

## 2020-12-16 NOTE — Progress Notes (Signed)
This patient returns to my office for at risk foot care.  This patient requires this care by a professional since this patient will be at risk due to having  Diabetic neuropathy and Parkinsons.  This patient is unable to cut nails himself since the patient cannot reach his nails.These nails are painful walking and wearing shoes.  This patient presents for at risk foot care today.  General Appearance  Alert, conversant and in no acute stress.  Vascular  Dorsalis pedis and posterior tibial  pulses are palpable  bilaterally.  Capillary return is within normal limits  bilaterally. Temperature is within normal limits  bilaterally.  Neurologic  Senn-Weinstein monofilament wire diminished  bilaterally. Muscle power within normal limits bilaterally.  Nails Thick disfigured discolored nails with subungual debris  from hallux to fifth toes bilaterally. No evidence of bacterial infection or drainage bilaterally.  Orthopedic  No limitations of motion  feet .  No crepitus or effusions noted.  No bony pathology or digital deformities noted.  Skin  normotropic skin with no porokeratosis noted bilaterally.  No signs of infections or ulcers noted.     Onychomycosis  Pain in right toes  Pain in left toes  Consent was obtained for treatment procedures.   Mechanical debridement of nails 1-5  bilaterally performed with a nail nipper.  Filed with dremel without incident. Iatrogenic lesion upon removing nail spicule right hallux.   Return office visit   3 months                   Told patient to return for periodic foot care and evaluation due to potential at risk complications.   Gardiner Barefoot DPM

## 2021-01-16 DIAGNOSIS — C61 Malignant neoplasm of prostate: Secondary | ICD-10-CM | POA: Diagnosis not present

## 2021-01-17 DIAGNOSIS — M25511 Pain in right shoulder: Secondary | ICD-10-CM | POA: Diagnosis not present

## 2021-01-26 NOTE — Progress Notes (Signed)
Cardiology Office Note   Date:  01/27/2021   ID:  Johnny Fox, DOB 03-Jun-1928, MRN [id number]  PCP:  Josetta Huddle, MD    Chief Complaint  Patient presents with   Follow-up   CAD  Wt Readings from Last 3 Encounters:  01/27/21 173 lb (78.5 kg)  09/18/20 175 lb 9.6 oz (79.7 kg)  04/08/20 180 lb (81.6 kg)       History of Present Illness: Johnny Fox is a 88 y.o. male  who has had CABG in 2004. Prior to CABG, he was not having many symptoms. He had a routine ECG and stress test showing CAD. He had only some DOE.    Exercise limited by foot pain.  He has neuropathic pain and tingling.  His balance is off as well.  He has fallen in the past.  He had a shoulder problem after a fall.   At the 2020 visit, he had hurt his right arm playing golf.  He has a balance problem.  He has not fallen recently.    He noted some bruising and swelling at his right bicep.    It appeared he had a biceps tendon rupture.  He was managed conservatively.   Stress test in 2021 showed: "The left ventricular ejection fraction is hyperdynamic (>65%). Nuclear stress EF: 68%. There was no ST segment deviation noted during stress. No T wave inversion was noted during stress. Defect 1: There is a small defect of mild severity present in the mid anterior location. Findings consistent with ischemia. This is a low risk study.   Low risk stress nuclear study with a small area of ischemia, possibly due to a diagonal artery stenosis, otherwise normal perfusion. Normal left ventricular regional and global systolic function."   Valsartan was stopped due to low BP.  Home readings have been in the 120/70 range on average.  Stopped Parkinsons exercise program due to low BP.  Restarted the Parkinson's class, [employer] boxing.  BP has been stable.  He stays hydrated.  He feels he fatigues easily. Readings at home have been in the 120s/80.   Denies : Chest pain. Dizziness. Leg edema. Nitroglycerin  use. Orthopnea. Palpitations. Paroxysmal nocturnal dyspnea. Shortness of breath. Syncope.      Past Medical History:  Diagnosis Date   Actinic keratoses    and seborrheic keratoses   Arrhythmia    afib postoperatively after CABG in 2004, No recurrence   Coronary artery disease    Diabetes mellitus without complication ([place])    diet controlled   Dyslipidemia    ED (erectile dysfunction)    Elevated PSA    2010, workup with Dr. Risa Grill   H/O prostate biopsy    Hypertension    Onychomycosis    Prostate cancer ([place])    PSA's run approx 7- 2011-2013   Right shoulder injury    july 2014, Dr. Lennette Bihari Supple   Tick bite of abdomen    2013, treated with 3wks of doxycycline, tick engorged 3.89mm nodule left thyroid 4/14- seen on carotid u/s    Past Surgical History:  Procedure Laterality Date   CARDIAC SURGERY     CATARACT EXTRACTION, BILATERAL     CORONARY ARTERY BYPASS GRAFT  2004     Current Outpatient Medications  Medication Sig Dispense Refill   acetaminophen (TYLENOL) 325 MG tablet 1 tablet as needed     Ascorbic Acid (VITAMIN C) 1000 MG tablet 1 tablet     aspirin EC 81 MG  tablet Take 1 tablet (81 mg total) by mouth daily. 90 tablet 3   Benfotiamine 150 MG CAPS Take 1 capsule by mouth daily.     Carbidopa-Levodopa ER (SINEMET CR) 25-100 MG tablet controlled release Take 1 tablet by mouth in the morning, at noon, and at bedtime. 7am/11am/4pm 270 tablet 1   cholecalciferol (VITAMIN D) 1000 UNITS tablet Take 1,000 Units by mouth daily.     Coenzyme Q10 (COQ-10) 100 MG CAPS Take 1 capsule by mouth daily.     Cyanocobalamin (B-12) 5000 MCG CAPS Take 1 tablet by mouth daily.     naproxen sodium (ALEVE) 220 MG tablet 1 tablet with food or milk as needed     nitroGLYCERIN (NITROSTAT) 0.4 MG SL tablet Place 1 tablet (0.4 mg total) under the tongue every 5 (five) minutes as needed for chest pain. 25 tablet 3   Omega-3 Fatty Acids (FISH OIL) 1000 MG CAPS Take 1,000 mg by mouth 2 (two)  times daily after a meal.      rosuvastatin (CRESTOR) 5 MG tablet Take 5 mg by mouth. Take one tablet by mouth Monday through Friday.     TURMERIC PO Take 1 capsule by mouth 2 (two) times daily.      No current facility-administered medications for this visit.    Allergies:   Chocolate flavor and Food    Social History:  The patient  reports that he has never smoked. He has never used smokeless tobacco. He reports that he does not drink alcohol and does not use drugs.   Family History:  The patient's family history includes Breast cancer in his child; CAD in his father; CVA in his father; Diabetes in his brother; Heart attack in his brother; Hypertension in his father; Stroke in his mother and sister.    ROS:  Please see the history of present illness.   Otherwise, review of systems are positive for balance issues.   All other systems are reviewed and negative.    PHYSICAL EXAM: VS:  BP 126/76    Pulse 85    Ht 5' 10.5" (1.791 m)    Wt 173 lb (78.5 kg)    SpO2 96%    BMI 24.47 kg/m  , BMI Body mass index is 24.47 kg/m. GEN: Well nourished, well developed, in no acute distress HEENT: normal Neck: no JVD, carotid bruits, or masses Cardiac: RRR; no murmurs, rubs, or gallops,no edema  Respiratory:  clear to auscultation bilaterally, normal work of breathing GI: soft, nontender, nondistended, + BS MS: no deformity or atrophy Skin: warm and dry, no rash Neuro:  Strength and sensation are intact Psych: euthymic mood, full affect   EKG:   The ekg ordered today demonstrates NSR, RBBB, no ST changes   Recent Labs: No results found for requested labs within last 8760 hours.   Lipid Panel No results found for: CHOL, TRIG, HDL, CHOLHDL, VLDL, LDLCALC, LDLDIRECT   Other studies Reviewed: Additional studies/ records that were reviewed today with results demonstrating: LDL 37 in 02/2020- will be getting this rechecked in 02/2021.   ASSESSMENT AND PLAN:  CAD: No angina on medical  therapy.  Continue aggressive secondary prevention.   HTN: The current medical regimen is effective;  continue present plan and medications.  Avoid processed foods. Hyperlipidemia: The current medical regimen is effective;  continue present plan and medications.  Appetite is good.  Vegetables/fiber intake will be beneficial for his cholesterol and overall cardiac health. Pakinsons: Continue exercise program.  He is  careful to avoid falling.     Current medicines are reviewed at length with the patient today.  The patient concerns regarding his medicines were addressed.  The following changes have been made:  No change  Labs/ tests ordered today include:  No orders of the defined types were placed in this encounter.   Recommend 150 minutes/week of aerobic exercise Low fat, low carb, high fiber diet recommended  Disposition:   FU in 1 year   Signed, Larae Grooms, MD  01/27/2021 10:53 AM    [facility] [address], [place], [place]  [address] Phone: [phone]; Fax: [phone]

## 2021-01-27 ENCOUNTER — Encounter: Payer: Self-pay | Admitting: Interventional Cardiology

## 2021-01-27 ENCOUNTER — Other Ambulatory Visit: Payer: Self-pay

## 2021-01-27 ENCOUNTER — Ambulatory Visit (INDEPENDENT_AMBULATORY_CARE_PROVIDER_SITE_OTHER): Payer: Medicare Other | Admitting: Interventional Cardiology

## 2021-01-27 VITALS — BP 126/76 | HR 85 | Ht 70.5 in | Wt 173.0 lb

## 2021-01-27 DIAGNOSIS — I10 Essential (primary) hypertension: Secondary | ICD-10-CM

## 2021-01-27 DIAGNOSIS — E7800 Pure hypercholesterolemia, unspecified: Secondary | ICD-10-CM

## 2021-01-27 DIAGNOSIS — I2510 Atherosclerotic heart disease of native coronary artery without angina pectoris: Secondary | ICD-10-CM | POA: Diagnosis not present

## 2021-01-27 NOTE — Patient Instructions (Signed)

## 2021-01-31 DIAGNOSIS — C61 Malignant neoplasm of prostate: Secondary | ICD-10-CM | POA: Diagnosis not present

## 2021-01-31 DIAGNOSIS — R351 Nocturia: Secondary | ICD-10-CM | POA: Diagnosis not present

## 2021-01-31 DIAGNOSIS — N401 Enlarged prostate with lower urinary tract symptoms: Secondary | ICD-10-CM | POA: Diagnosis not present

## 2021-01-31 DIAGNOSIS — R3912 Poor urinary stream: Secondary | ICD-10-CM | POA: Diagnosis not present

## 2021-02-02 ENCOUNTER — Other Ambulatory Visit: Payer: Self-pay

## 2021-02-02 ENCOUNTER — Encounter (HOSPITAL_COMMUNITY): Payer: Self-pay

## 2021-02-02 ENCOUNTER — Emergency Department (HOSPITAL_COMMUNITY)
Admission: EM | Admit: 2021-02-02 | Discharge: 2021-02-02 | Disposition: A | Discharge status: Home / Self Care | Payer: Medicare Other | Attending: Emergency Medicine | Admitting: Emergency Medicine

## 2021-02-02 ENCOUNTER — Emergency Department (HOSPITAL_COMMUNITY): Payer: Medicare Other

## 2021-02-02 DIAGNOSIS — E119 Type 2 diabetes mellitus without complications: Secondary | ICD-10-CM | POA: Diagnosis not present

## 2021-02-02 DIAGNOSIS — I10 Essential (primary) hypertension: Secondary | ICD-10-CM | POA: Diagnosis not present

## 2021-02-02 DIAGNOSIS — Z79899 Other long term (current) drug therapy: Secondary | ICD-10-CM | POA: Insufficient documentation

## 2021-02-02 DIAGNOSIS — R030 Elevated blood-pressure reading, without diagnosis of hypertension: Secondary | ICD-10-CM

## 2021-02-02 DIAGNOSIS — Z8679 Personal history of other diseases of the circulatory system: Secondary | ICD-10-CM | POA: Insufficient documentation

## 2021-02-02 DIAGNOSIS — R0789 Other chest pain: Secondary | ICD-10-CM | POA: Insufficient documentation

## 2021-02-02 DIAGNOSIS — I2510 Atherosclerotic heart disease of native coronary artery without angina pectoris: Secondary | ICD-10-CM | POA: Insufficient documentation

## 2021-02-02 DIAGNOSIS — Z7982 Long term (current) use of aspirin: Secondary | ICD-10-CM | POA: Diagnosis not present

## 2021-02-02 DIAGNOSIS — Z8546 Personal history of malignant neoplasm of prostate: Secondary | ICD-10-CM | POA: Insufficient documentation

## 2021-02-02 DIAGNOSIS — Z951 Presence of aortocoronary bypass graft: Secondary | ICD-10-CM | POA: Diagnosis not present

## 2021-02-02 DIAGNOSIS — R079 Chest pain, unspecified: Secondary | ICD-10-CM

## 2021-02-02 DIAGNOSIS — R001 Bradycardia, unspecified: Secondary | ICD-10-CM | POA: Diagnosis not present

## 2021-02-02 LAB — BASIC METABOLIC PANEL
Anion gap: 7 (ref 5–15)
BUN: 26 mg/dL — ABNORMAL HIGH (ref 8–23)
CO2: 25 mmol/L (ref 22–32)
Calcium: 9.1 mg/dL (ref 8.9–10.3)
Chloride: 104 mmol/L (ref 98–111)
Creatinine, Ser: 1.41 mg/dL — ABNORMAL HIGH (ref 0.61–1.24)
GFR, Estimated: 47 mL/min — ABNORMAL LOW (ref >60)
Glucose, Bld: 116 mg/dL — ABNORMAL HIGH (ref 70–99)
Potassium: 4.7 mmol/L (ref 3.5–5.1)
Sodium: 136 mmol/L (ref 135–145)

## 2021-02-02 LAB — CBC
HCT: 39.4 % (ref 39.0–52.0)
Hemoglobin: 13.1 g/dL (ref 13.0–17.0)
MCH: 32.5 pg (ref 26.0–34.0)
MCHC: 33.2 g/dL (ref 30.0–36.0)
MCV: 97.8 fL (ref 80.0–100.0)
Platelets: 138 10*3/uL — ABNORMAL LOW (ref 150–400)
RBC: 4.03 MIL/uL — ABNORMAL LOW (ref 4.22–5.81)
RDW: 12.8 % (ref 11.5–15.5)
WBC: 8.2 10*3/uL (ref 4.0–10.5)
nRBC: 0 % (ref 0.0–0.2)

## 2021-02-02 LAB — TROPONIN I (HIGH SENSITIVITY)
Troponin I (High Sensitivity): 12 ng/L (ref <18)
Troponin I (High Sensitivity): 12 ng/L (ref <18)

## 2021-02-02 LAB — CBG MONITORING, ED: Glucose-Capillary: 99 mg/dL (ref 70–99)

## 2021-02-02 MED ORDER — NITROGLYCERIN 0.4 MG SL SUBL: 0.4000 mg | SUBLINGUAL_TABLET | SUBLINGUAL | 3 refills | Status: DC | PRN

## 2021-02-02 MED ORDER — AMLODIPINE BESYLATE 5 MG PO TABS
5.0000 mg | ORAL_TABLET | Freq: Once | ORAL | Status: AC
  Administered 2021-02-02: 5 mg via ORAL
  Filled 2021-02-02: qty 1

## 2021-02-02 MED ORDER — AMLODIPINE BESYLATE 5 MG PO TABS: 5.0000 mg | ORAL_TABLET | Freq: Every day | ORAL | 0 refills | Status: DC

## 2021-02-02 MED ORDER — HYDRALAZINE HCL 10 MG PO TABS
10.0000 mg | ORAL_TABLET | Freq: Once | ORAL | Status: AC
  Administered 2021-02-02: 10 mg via ORAL
  Filled 2021-02-02: qty 1

## 2021-02-02 MED ORDER — ASPIRIN 81 MG PO CHEW: 324.0000 mg | CHEWABLE_TABLET | Freq: Once | ORAL | Status: DC

## 2021-02-02 MED ORDER — ACETAMINOPHEN 500 MG PO TABS
500.0000 mg | ORAL_TABLET | Freq: Once | ORAL | Status: AC
  Administered 2021-02-02: 500 mg via ORAL
  Filled 2021-02-02: qty 1

## 2021-02-02 NOTE — ED Provider Notes (Signed)
Johnny Fox   CSN: [id number] Arrival date & time: 02/02/21  1227     History  Chief Complaint  Patient presents with   Chest Pain    Johnny Fox is a 88 y.o. male.   Chest Pain Associated symptoms: no fever and no shortness of breath    HPI: A 88 year old patient with a history of treated diabetes, hypertension and hypercholesterolemia presents for evaluation of chest pain. Initial onset of pain was approximately 1-3 hours ago. The patient's chest pain is not worse with exertion. The patient's chest pain is not middle- or left-sided, is not well-localized, is not described as heaviness/pressure/tightness, is not sharp and does not radiate to the arms/jaw/neck. The patient does not complain of nausea and denies diaphoresis. The patient has no history of stroke, has no history of peripheral artery disease, has not smoked in the past 90 days, has no relevant family history of coronary artery disease (first degree relative at less than age 36) and does not have an elevated BMI (>=30).  Patient has a history of hypertension, coronary artery disease status post coronary bypass grafting in 2004, dyslipidemia, diabetes and remote history of prostate cancer who presents with complaints of chest pain.  Patient was at church when he had an episode of chest pain.  There is more on the right side of his chest he states in about 4-10.  He took 2 nitroglycerin which did not really help with the pain.  He ended up going home and he took aspirin.  The pain resolved after about an hour.  He is symptom-free now.  He denies any coughing fevers chills no shortness of breath.  No leg swelling Home Medications Prior to Admission medications   Medication Sig Start Date End Date Taking? Authorizing Provider  acetaminophen (TYLENOL) 325 MG tablet Take 325 mg by mouth every 4 (four) hours as needed for mild pain or headache.   Yes [provider]   amLODipine (NORVASC) 5 MG tablet Take 1 tablet (5 mg total) by mouth daily. 02/02/21 03/04/21 Yes Dorie Rank, MD  Ascorbic Acid (VITAMIN C) 1000 MG tablet Take 1,000 mg by mouth daily.   Yes [provider]  aspirin EC 81 MG tablet Take 1 tablet (81 mg total) by mouth daily. 01/14/15  Yes Jettie Booze, MD  Benfotiamine 150 MG CAPS Take 150 mg by mouth daily.   Yes [provider]  Carbidopa-Levodopa ER (SINEMET CR) 25-100 MG tablet controlled release Take 1 tablet by mouth in the morning, at noon, and at bedtime. 7am/11am/4pm 09/18/20  Yes Tat, Eustace Quail, DO  cholecalciferol (VITAMIN D) 1000 UNITS tablet Take 1,000 Units by mouth daily.   Yes [provider]  Coenzyme Q10 (COQ-10) 100 MG CAPS Take 1 capsule by mouth daily.   Yes [provider]  Cyanocobalamin (B-12) 5000 MCG CAPS Take 1 tablet by mouth daily.   Yes [provider]  naproxen sodium (ALEVE) 220 MG tablet Take 220 mg by mouth daily as needed (pain).   Yes [provider]  Omega-3 Fatty Acids (FISH OIL) 1000 MG CAPS Take 1,000 mg by mouth 2 (two) times daily after a meal.    Yes [provider]  rosuvastatin (CRESTOR) 5 MG tablet Take 5 mg by mouth. Take one tablet by mouth Monday through Friday.   Yes [provider]  TURMERIC PO Take 1 capsule by mouth 2 (two) times daily.    Yes [provider]  nitroGLYCERIN (NITROSTAT) 0.4 MG SL tablet Place 1 tablet (0.4 mg total) under the tongue every 5 (five) minutes as needed for chest pain. 02/02/21   Dorie Rank, MD      Allergies    Chocolate flavor and Food    Review of Systems   Review of Systems  Constitutional:  Negative for fever.  Respiratory:  Negative for shortness of breath.   Cardiovascular:  Positive for chest pain.   Physical Exam Updated Vital Signs BP (!) 176/101    Pulse 84    Temp 98 F (36.7 C) (Oral)    Resp (!) 25    Ht 1.778 m (5\' 10" )    Wt 79.4 kg    SpO2 99%    BMI 25.11  kg/m  Physical Exam Vitals and nursing Fox reviewed.  Constitutional:      General: He is not in acute distress.    Appearance: He is well-developed.  HENT:     Head: Normocephalic and atraumatic.     Right Ear: External ear normal.     Left Ear: External ear normal.  Eyes:     General: No scleral icterus.       Right eye: No discharge.        Left eye: No discharge.     Conjunctiva/sclera: Conjunctivae normal.  Neck:     Trachea: No tracheal deviation.  Cardiovascular:     Rate and Rhythm: Normal rate and regular rhythm.  Pulmonary:     Effort: Pulmonary effort is normal. No respiratory distress.     Breath sounds: Normal breath sounds. No stridor. No wheezing or rales.  Abdominal:     General: Bowel sounds are normal. There is no distension.     Palpations: Abdomen is soft.     Tenderness: There is no abdominal tenderness. There is no guarding or rebound.  Musculoskeletal:        General: No tenderness or deformity.     Cervical back: Neck supple.  Skin:    General: Skin is warm and dry.     Findings: No rash.  Neurological:     General: No focal deficit present.     Mental Status: He is alert.     Cranial Nerves: No cranial nerve deficit (no facial droop, extraocular movements intact, no slurred speech).     Sensory: No sensory deficit.     Motor: No abnormal muscle tone or seizure activity.     Coordination: Coordination normal.  Psychiatric:        Mood and Affect: Mood normal.    ED Results / Procedures / Treatments   Labs (all labs ordered are listed, but only abnormal results are displayed) Labs Reviewed  BASIC METABOLIC PANEL - Abnormal; Notable for the following components:      Result Value   Glucose, Bld 116 (*)    BUN 26 (*)    Creatinine, Ser 1.41 (*)    GFR, Estimated 47 (*)    All other components within normal limits  CBC - Abnormal; Notable for the following components:   RBC 4.03 (*)    Platelets 138 (*)    All other components within  normal limits  CBG MONITORING, ED  TROPONIN I (HIGH SENSITIVITY)  TROPONIN I (HIGH SENSITIVITY)    EKG EKG Interpretation  Date/Time:  Sunday February 02 2021 12:32:21 EST Ventricular Rate:  68 PR Interval:  232 QRS Duration: 145 QT Interval:  415 QTC Calculation: 442 R Axis:  6 Text Interpretation: Sinus rhythm Prolonged PR interval Right bundle branch block RBBB new since last tracing Confirmed by Dorie Rank [id number]) on 02/02/2021 12:50:50 PM  Radiology DG Chest Portable 1 View  Result Date: 02/02/2021 CLINICAL DATA:  Chest pain. EXAM: PORTABLE CHEST 1 VIEW COMPARISON:  January 23, 2013. FINDINGS: Stable cardiomediastinal silhouette. Status post coronary bypass graft. Both lungs are clear. The visualized skeletal structures are unremarkable. IMPRESSION: No active disease. Electronically Signed   By: Marijo Conception M.D.   On: 02/02/2021 13:42    Procedures Procedures    Medications Ordered in ED Medications  amLODipine (NORVASC) tablet 5 mg (5 mg Oral Given 02/02/21 1536)  acetaminophen (TYLENOL) tablet 500 mg (500 mg Oral Given 02/02/21 1636)  hydrALAZINE (APRESOLINE) tablet 10 mg (10 mg Oral Given 02/02/21 1635)    ED Course/ Medical Decision Making/ A&P Clinical Course as of 02/03/21 0759  Sun Feb 02, 2021  1348 CBC(!) CBC normal [JK]  3710 Basic metabolic panel(!) Increased creatinine at 1.41 [JK]  1348 Troponin I (High Sensitivity) Initial troponin normal [JK]  1348 DG Chest Portable 1 View Chest x-ray images and report reviewed.  No acute findings [JK]  1604 D/w Dr Acie Fredrickson.  If second trop normal would  be ok for discharge and outpatient follow up. [GY]  6948 NIOEVO with 5 of amlodopine  [JK]    Clinical Course User Index [JK] Dorie Rank, MD   HEAR Score: 5                       Medical Decision Making Amount and/or Complexity of Data Reviewed Labs: ordered. Decision-making details documented in ED Course. Radiology: ordered. Decision-making details documented  in ED Course.  Risk OTC drugs. Prescription drug management.   Chest pain. Patient does have a history of heart disease.  No history of PE. Has been symptom-free for a number of years.  Presented today with complaints of midsternal chest pain.  Doubt PE, aortic dissection, pna. Symptoms resolved after aspirin.  No significant relief after his initial nitroglycerin.  Initial laboratory testing findings are reassuring.  Plan on delta troponin and reassessment.   Discussed with Dr Acie Fredrickson cardiology.  Overall presentation felt to be low risk for acute coronary syndrome. Plan on delta troponin and if negative discharge.  Findings and plan discussed with pt wife and daughter.  Hypertension Patient noted to have hypertension.  Not currently on any medications at home.  Oral dose of norvasc ordered.  Discussed with Dr Acie Fredrickson and plan on starting pt on norvasc 5 mg.  Will also refill his NTG to make sure they are not expired.  Delta pending at shift change, Dr Roslynn Amble to follow up on the result.        Final Clinical Impression(s) / ED Diagnoses Final diagnoses:  Chest pain, unspecified type  Elevated blood pressure reading    Rx / DC Orders ED Discharge Orders          Ordered    amLODipine (NORVASC) 5 MG tablet  Daily        02/02/21 1608    nitroGLYCERIN (NITROSTAT) 0.4 MG SL tablet  Every 5 min PRN        02/02/21 1608              Dorie Rank, MD 02/03/21 0800

## 2021-02-02 NOTE — ED Notes (Signed)
Pt family concerned about BP. Notified EDP

## 2021-02-02 NOTE — ED Triage Notes (Signed)
Pt bib ems from home c/o chest pain. Pt was at church when he started having chest pain midsternum 4/10 pain. He took 2 nitroglycerin and returned home when his pain subsided. Pt took 4 baby aspirin. No chest pain currently. Hx CABG 2004 and parkinson.   BP 164/90 HR 90 RA 100%

## 2021-02-02 NOTE — ED Provider Notes (Signed)
Signout note  88 year old gentleman with past history of CAD s/p CABG in 2004 presenting to ER with concern for chest discomfort.  Currently pain-free.  EKG without ischemic change, initial troponin normal.  Case was discussed with cardiology, Dr. Acie Fredrickson, who recommended discharge and outpatient cardiology follow-up if second troponin within normal limits.  Noted to be hypertensive, this was discussed with cardiology and recommended starting amlodipine 5 mg daily for now.  I reassessed patient, his repeat troponin is normal.  His blood pressure is still somewhat elevated.  States that he is having a mild headache.  No chest pain and feels well overall.  Discussed recommendations for close follow-up with cardiology and initiation of the amlodipine.  Recommended monitoring of headache, instructed to return should he have any worsening of his headache or develop new chest pain.  Updated family at bedside and son via phone.     Lucrezia Starch, MD 02/02/21 [phone]

## 2021-02-02 NOTE — ED Notes (Signed)
Pt stated he has a headache notified EDP for Tylenol.

## 2021-02-02 NOTE — Discharge Instructions (Signed)
Please follow-up both with your cardiologist as well as with your primary care doctor.  Please discuss management of your blood pressure regimen with them at the time.   Come back to ER if you feel you are having worsening headache, vomiting, chest pain, difficulty breathing, any lightheadedness or episodes of passing out.

## 2021-02-03 ENCOUNTER — Ambulatory Visit (INDEPENDENT_AMBULATORY_CARE_PROVIDER_SITE_OTHER): Payer: Medicare Other | Admitting: Interventional Cardiology

## 2021-02-03 ENCOUNTER — Encounter: Payer: Self-pay | Admitting: Interventional Cardiology

## 2021-02-03 VITALS — BP 132/78 | HR 82 | Ht 70.5 in | Wt 176.0 lb

## 2021-02-03 DIAGNOSIS — I2510 Atherosclerotic heart disease of native coronary artery without angina pectoris: Secondary | ICD-10-CM

## 2021-02-03 DIAGNOSIS — I10 Essential (primary) hypertension: Secondary | ICD-10-CM

## 2021-02-03 DIAGNOSIS — E782 Mixed hyperlipidemia: Secondary | ICD-10-CM

## 2021-02-03 MED ORDER — AMLODIPINE BESYLATE 2.5 MG PO TABS: 2.5000 mg | ORAL_TABLET | Freq: Every day | ORAL | 3 refills | Status: DC

## 2021-02-03 NOTE — Patient Instructions (Signed)
Medication Instructions:  Your physician has recommended you make the following change in your medication: Decrease amlodipine to 2.5 mg by mouth daily.  *If you need a refill on your cardiac medications before your next appointment, please call your pharmacy*   Lab Work: none If you have labs (blood work) drawn today and your tests are completely normal, you will receive your results only by: [place] (if you have MyChart) OR A paper copy in the mail If you have any lab test that is abnormal or we need to change your treatment, we will call you to review the results.   Testing/Procedures: none   Follow-Up: At [facility], you and your health needs are our priority.  As part of our continuing mission to provide you with exceptional heart care, we have created designated Provider Care Teams.  These Care Teams include your primary Cardiologist (physician) and Advanced Practice Providers (APPs -  Physician Assistants and Nurse Practitioners) who all work together to provide you with the care you need, when you need it.  We recommend signing up for the patient portal called "MyChart".  Sign up information is provided on this After Visit Summary.  MyChart is used to connect with patients for Virtual Visits (Telemedicine).  Patients are able to view lab/test results, encounter notes, upcoming appointments, etc.  Non-urgent messages can be sent to your provider as well.   To learn more about what you can do with MyChart, go to [email or web address].    Your next appointment:   12 month(s)  The format for your next appointment:   In Person  Provider:   Larae Grooms, MD     Other Instructions .inst You have been referred to Hypertension clinic in our office.  Please schedule new patient appointment in 2-3 weeks. Check blood pressure at home and bring readings to appointment.  Bring blood pressure cuff to this appointment

## 2021-02-03 NOTE — Progress Notes (Signed)
Cardiology Office Note   Date:  02/03/2021   ID:  Johnny Fox, DOB 01-17-1928, MRN [id number]  PCP:  Josetta Huddle, MD    Chief Complaint  Patient presents with   Follow-up     Wt Readings from Last 3 Encounters:  02/03/21 176 lb (79.8 kg)  02/02/21 175 lb (79.4 kg)  01/27/21 173 lb (78.5 kg)       History of Present Illness: Johnny Fox is a 88 y.o. male  who has had CABG in 2004. Prior to CABG, he was not having many symptoms. He had a routine ECG and stress test showing CAD. He had only some DOE. Minimal angina at that time.     Exercise limited by foot pain.  He has neuropathic pain and tingling.  His balance is off as well.  He has fallen in the past.  He had a shoulder problem after a fall.   At the 2020 visit, he had hurt his right arm playing golf.  He has a balance problem.  He has not fallen recently.    He noted some bruising and swelling at his right bicep.    It appeared he had a biceps tendon rupture.  He was managed conservatively.   Stress test in 2021 showed: "The left ventricular ejection fraction is hyperdynamic (>65%). Nuclear stress EF: 68%. There was no ST segment deviation noted during stress. No T wave inversion was noted during stress. Defect 1: There is a small defect of mild severity present in the mid anterior location. Findings consistent with ischemia. This is a low risk study.   Low risk stress nuclear study with a small area of ischemia, possibly due to a diagonal artery stenosis, otherwise normal perfusion. Normal left ventricular regional and global systolic function."   Valsartan was stopped due to low BP.  Home readings have been in the 120/70 range on average.  Stopped Parkinsons exercise program due to low BP.   Restarted the Parkinson's class, Rocksteady boxing.  BP has been stable.  He stays hydrated.  He feels he fatigues easily. Readings at home have been in the 120s/80.   Had right sided chest pain while at  church.  Took 2 NTG wihtout relief.    Denies : Dizziness. Leg edema. Orthopnea. Palpitations. Paroxysmal nocturnal dyspnea. Shortness of breath. Syncope.    Since being in the ER and starting amlodipine, blood pressures at home have improved.  Troponins negative x2.  They had a low blood pressure this morning of 99/41.  No dizziness.   Past Medical History:  Diagnosis Date   Actinic keratoses    and seborrheic keratoses   Arrhythmia    afib postoperatively after CABG in 2004, No recurrence   Coronary artery disease    Diabetes mellitus without complication ([place])    diet controlled   Dyslipidemia    ED (erectile dysfunction)    Elevated PSA    2010, workup with Dr. Risa Grill   H/O prostate biopsy    Hypertension    Onychomycosis    Prostate cancer [facility]    PSA's run approx 7- 2011-2013   Right shoulder injury    july 2014, Dr. Lennette Bihari Supp[place]   Tick bite of abdomen    2013, treated with 3wks of doxycycline, tick engorged 3.37mm nodule left thyroid 4/14- seen on carotid u/s    Past Surgical History:  Procedure Laterality Date   CARDIAC SURGERY     CATARACT EXTRACTION, BILATERAL  CORONARY ARTERY BYPASS GRAFT  2004     Current Outpatient Medications  Medication Sig Dispense Refill   acetaminophen (TYLENOL) 325 MG tablet Take 325 mg by mouth every 4 (four) hours as needed for mild pain or headache.     amLODipine (NORVASC) 5 MG tablet Take 1 tablet (5 mg total) by mouth daily. 30 tablet 0   Ascorbic Acid (VITAMIN C) 1000 MG tablet Take 1,000 mg by mouth daily.     aspirin EC 81 MG tablet Take 1 tablet (81 mg total) by mouth daily. 90 tablet 3   Benfotiamine 150 MG CAPS Take 150 mg by mouth daily.     Carbidopa-Levodopa ER (SINEMET CR) 25-100 MG tablet controlled release Take 1 tablet by mouth in the morning, at noon, and at bedtime. 7am/11am/4pm 270 tablet 1   cholecalciferol (VITAMIN D) 1000 UNITS tablet Take 1,000 Units by mouth daily.     Coenzyme Q10 (COQ-10) 100 MG CAPS  Take 1 capsule by mouth daily.     Cyanocobalamin (B-12) 5000 MCG CAPS Take 1 tablet by mouth daily.     naproxen sodium (ALEVE) 220 MG tablet Take 220 mg by mouth daily as needed (pain).     nitroGLYCERIN (NITROSTAT) 0.4 MG SL tablet Place 1 tablet (0.4 mg total) under the tongue every 5 (five) minutes as needed for chest pain. 25 tablet 3   Omega-3 Fatty Acids (FISH OIL) 1000 MG CAPS Take 1,000 mg by mouth 2 (two) times daily after a meal.      rosuvastatin (CRESTOR) 5 MG tablet Take 5 mg by mouth. Take one tablet by mouth Monday through Friday.     TURMERIC PO Take 1 capsule by mouth 2 (two) times daily.      No current facility-administered medications for this visit.    Allergies:   Chocolate flavor and Food    Social History:  The patient  reports that he has never smoked. He has never used smokeless tobacco. He reports that he does not drink alcohol and does not use drugs.   Family History:  The patient's family history includes Breast cancer in his child; CAD in his father; CVA in his father; Diabetes in his brother; Heart attack in his brother; Hypertension in his father; Stroke in his mother and sister.    ROS:  Please see the history of present illness.   Otherwise, review of systems are positive for episode of right-sided chest pain.   All other systems are reviewed and negative.    PHYSICAL EXAM: VS:  BP 132/78    Pulse 82    Ht 5' 10.5" (1.791 m)    Wt 176 lb (79.8 kg)    SpO2 98%    BMI 24.90 kg/m  , BMI Body mass index is 24.9 kg/m. GEN: Well nourished, well developed, in no acute distress HEENT: normal Neck: no JVD, carotid bruits, or masses Cardiac: RRR; no murmurs, rubs, or gallops,no edema  Respiratory:  clear to auscultation bilaterally, normal work of breathing GI: soft, nontender, nondistended, + BS MS: no deformity or atrophy; no pain to palpation of his chest Skin: warm and dry, no rash Neuro:  Strength and sensation are intact Psych: euthymic mood, full  affect   EKG:   The ekg ordered yesterday demonstrates normal sinus rhythm, right bundle branch block   Recent Labs: 02/02/2021: BUN 26; Creatinine, Ser 1.41; Hemoglobin 13.1; Platelets 138; Potassium 4.7; Sodium 136   Lipid Panel No results found for: CHOL, TRIG, HDL, CHOLHDL,  VLDL, LDLCALC, LDLDIRECT   Other studies Reviewed: Additional studies/ records that were reviewed today with results demonstrating: ER records reviewed.   ASSESSMENT AND PLAN:  CAD: Atypical chest pain.  It was on the right side of his chest.  Not related to exertion.  Low risk stress test in 2021.  We will add amlodipine 2.5 mg daily.  He was given 5 mg yesterday but given the low blood pressure they had this morning, the family would feel better with a lower dose of medication.  He will continue to monitor his blood pressure at home.  We will plan for blood pressure follow-up in our Pharm.D. hypertension clinic in about 3 weeks. Hypertension: Avoid processed foods.  No salt.  He does not drink alcohol.  He can resume exercise.  This may help his blood pressure.  He will bring his blood pressure cuff to the appointment with the Pharm.D. so we can compare readings from home.  He will send [place] in readings from home as well via [place]. Hyperlipidemia: High-fiber diet.  No changes.  Continue rosuvastatin. Parkinson's: Okay to resume exercise program.  He also may start walking.  I cautioned him to be careful to avoid falling.   Current medicines are reviewed at length with the patient today.  The patient concerns regarding his medicines were addressed.  The following changes have been made: Decrease amlodipine to 2.5 mg daily  Labs/ tests ordered today include:  No orders of the defined types were placed in this encounter.   Recommend 150 minutes/week of aerobic exercise Low fat, low carb, high fiber diet recommended  Disposition:   FU in with Pharm.D. hypertension clinic in 3 to 4 weeks   Signed, Larae Grooms, MD  02/03/2021 12:03 PM    [address] [place], [place],   [address] Phone: [phone]; Fax: [phone]

## 2021-02-06 DIAGNOSIS — R030 Elevated blood-pressure reading, without diagnosis of hypertension: Secondary | ICD-10-CM | POA: Diagnosis not present

## 2021-02-06 DIAGNOSIS — R079 Chest pain, unspecified: Secondary | ICD-10-CM | POA: Diagnosis not present

## 2021-02-24 ENCOUNTER — Other Ambulatory Visit: Payer: Self-pay

## 2021-02-24 ENCOUNTER — Ambulatory Visit (INDEPENDENT_AMBULATORY_CARE_PROVIDER_SITE_OTHER): Payer: Medicare Other | Admitting: Pharmacist

## 2021-02-24 VITALS — BP 160/70 | HR 89

## 2021-02-24 DIAGNOSIS — I2510 Atherosclerotic heart disease of native coronary artery without angina pectoris: Secondary | ICD-10-CM

## 2021-02-24 DIAGNOSIS — I10 Essential (primary) hypertension: Secondary | ICD-10-CM

## 2021-02-24 MED ORDER — HYDRALAZINE HCL 10 MG PO TABS: ORAL_TABLET | ORAL | 5 refills | Status: DC

## 2021-02-24 NOTE — Patient Instructions (Addendum)
Your blood pressure goal is < 140/75mmHg  Continue checking your blood pressure twice a day in the morning and in the evening  Systolic blood pressure: < 150 - take nothing 150s-160s - take 1 hydralazine tablet (10mg ) 170s and up - take 2 hydralazine tablets (20mg )  Continue to monitor your blood pressure and let us know if you have any troubles

## 2021-02-24 NOTE — Progress Notes (Signed)
Patient ID: BRAIN HONEYCUTT                 DOB: [birth year]                      MRN: [id number]     HPI: Johnny Fox is a 88 y.o. male referred by Dr. Irish Lack to HTN clinic. PMH is significant for CABG in 2004, low risk stress test in 2021 with small area of ischemia and EF > 65%, DM, HTN, and Parkinson's disease. He was seen in the ED 02/02/21 for chest pain and was found to be hypertensive up to 176/101. EKG and troponins were negative and he was started on amlodipine 5mg  daily. At 02/03/21 visit with Dr Irish Lack, amlodipine was decreased to 2.5mg  daily given low BP that morning of 99/41.   He was seen by Dr Okey Dupre [facility] on 2/2 (chart notes faxed over). Home BP showed consistently low BP in the AM in the 70s-90s/50s-60s and PM readings 130s-140s/70s-80s without sx of hypotension in the AM. He had been taking his amlodipine in the evening. This was stopped and he was advised to monitor BP at home.  Pt presents today for follow up with his wife and daughter. Brings in a log of home BP readings being checked twice daily since he stopped amlodipine:  AM readings: 143/77, 82/51, 109/63, 151/90, 138/85, 125/73, 130/73, 139/84 PM readings: 154/80, 153/79, 135/75, 194/95, 135/79, 144/86, 171/91, 170/84, 184/81, 181/91, 125/74, 167/84  Asymptomatic with low and high BP readings aside from the headache he had the day he went to the ED. Has balance issues from neuropathy in his feet. Parkinson's also affects balance and his Sinemet can cause dizziness. Will be cautious of falls and use higher BP goal of < 140/90. Reports it's hard to cut tablets at home. Doesn't always wait 5-10 minutes after sitting before checking BP at home.  Current HTN meds: none Previously tried: amlodipine 2.5mg  daily - hypotensive to 70-90s/50-60s BP goal: <140/98mmHg given age and prior falls (Sinemet can also cause dizziness)  Family History: Breast cancer in his child; CAD in his father; CVA in his  father; Diabetes in his brother; Heart attack in his brother; Hypertension in his father; Stroke in his mother and sister.  Social History: The patient  reports that he has never smoked. He has never used smokeless tobacco. He reports that he does not drink alcohol and does not use drugs.   Diet: 1 cup of coffee in the morning, doesn't add salt to food, does like red meat  Exercise: Limited by neuropathic pain in his feet which has affected his balance, participates in rocksteady boxing Parkinson's exercise class  Wt Readings from Last 3 Encounters:  02/03/21 176 lb (79.8 kg)  02/02/21 175 lb (79.4 kg)  01/27/21 173 lb (78.5 kg)   BP Readings from Last 3 Encounters:  02/03/21 132/78  02/02/21 (!) 176/101  01/27/21 126/76   Pulse Readings from Last 3 Encounters:  02/03/21 82  02/02/21 84  01/27/21 85    Renal function: CrCl cannot be calculated (Patient's most recent lab result is older than the maximum 21 days allowed.).  Past Medical History:  Diagnosis Date   Actinic keratoses    and seborrheic keratoses   Arrhythmia    afib postoperatively after CABG in 2004, No recurrence   Coronary artery disease    Diabetes mellitus without complication ([place])    diet controlled   Dyslipidemia  ED (erectile dysfunction)    Elevated PSA    2010, workup with Dr. Risa Grill   H/O prostate biopsy    Hypertension    Onychomycosis    Prostate cancer [facility])    PSA's run approx 7- 2011-2013   Right shoulder injury    july 2014, Dr. Lennette Bihari [facility]   Tick bite of abdomen    2013, treated with 3wks of doxycycline, tick engorged 3.7mm nodule left thyroid 4/14- seen on carotid u/s    Current Outpatient Medications on File Prior to Visit  Medication Sig Dispense Refill   acetaminophen (TYLENOL) 325 MG tablet Take 325 mg by mouth every 4 (four) hours as needed for mild pain or headache.     amLODipine (NORVASC) 2.5 MG tablet Take 1 tablet (2.5 mg total) by mouth daily. 90 tablet 3   Ascorbic  Acid (VITAMIN C) 1000 MG tablet Take 1,000 mg by mouth daily.     aspirin EC 81 MG tablet Take 1 tablet (81 mg total) by mouth daily. 90 tablet 3   Benfotiamine 150 MG CAPS Take 150 mg by mouth daily.     Carbidopa-Levodopa ER (SINEMET CR) 25-100 MG tablet controlled release Take 1 tablet by mouth in the morning, at noon, and at bedtime. 7am/11am/4pm 270 tablet 1   cholecalciferol (VITAMIN D) 1000 UNITS tablet Take 1,000 Units by mouth daily.     Coenzyme Q10 (COQ-10) 100 MG CAPS Take 1 capsule by mouth daily.     Cyanocobalamin (B-12) 5000 MCG CAPS Take 1 tablet by mouth daily.     naproxen sodium (ALEVE) 220 MG tablet Take 220 mg by mouth daily as needed (pain).     nitroGLYCERIN (NITROSTAT) 0.4 MG SL tablet Place 1 tablet (0.4 mg total) under the tongue every 5 (five) minutes as needed for chest pain. 25 tablet 3   Omega-3 Fatty Acids (FISH OIL) 1000 MG CAPS Take 1,000 mg by mouth 2 (two) times daily after a meal.      rosuvastatin (CRESTOR) 5 MG tablet Take 5 mg by mouth. Take one tablet by mouth Monday through Friday.     TURMERIC PO Take 1 capsule by mouth 2 (two) times daily.      No current facility-administered medications on file prior to visit.    Allergies  Allergen Reactions   Chocolate Flavor Other (See Comments)    sneezing   Food Other (See Comments)    Chocolate-Sneezing     Assessment/Plan:  1. Hypertension - BPs have been fluctuating at home with some lows along with more frequent highs in the evening. Goal BP < 140/74mmHg given pt's age and fall risk (neuropathy in his feet, Parkinson's, and side effect of dizziness with his Sinemet). Since morning BP readings are typically at goal and even low dose of amlodipine 2.5mg  daily caused hypotension into the 30Z-60F systolic, will add on hydralazine to use only prn for SBP > 150. Advised pt to take 10mg  if SBP is in the 150s-160s and to take 20mg  if SBP is > 170. He and his wife will continue to monitor BP at home and call  with any concerns.  Floella Ensz E. Gurvir Schrom, PharmD, BCACP, [place] [address], [place], [place] [address] Phone: [phone]; Fax: [phone] 02/24/2021 1:54 PM

## 2021-02-28 ENCOUNTER — Telehealth: Payer: Self-pay | Admitting: Interventional Cardiology

## 2021-02-28 NOTE — Telephone Encounter (Signed)
Pt c/o BP issue: STAT if pt c/o blurred vision, one-sided weakness or slurred speech  1. What are your last 5 BP readings?  02/25/21:  8:30 AM: 132/76  9:00 PM: 152/76 no BP meds  02/26/21:   AM: 123/67  PM: 189/87 2 BP pills 02/27/21: AM: 134/82 PM: 186/94 2 BP pills 02/28/21:   AM: 124/65  3:30 PM: 174/79 No BP pills yet   2. Are you having any other symptoms (ex. Dizziness, headache, blurred vision, passed out)?  Weakness, labored breathing, SOB   3. What is your BP issue? Wife calling to document BP readings.   Wife is concerned because this afternoon was the first time that the patient was not able to get out of a chair at all. His wife had to pull him up. He was not hungry for lunch today   Pt c/o Shortness Of Breath: STAT if SOB developed within the last 24 hours or pt is noticeably SOB on the phone  1. Are you currently SOB (can you hear that pt is SOB on the phone)? No   2. How long have you been experiencing SOB? Just started today   3. Are you SOB when sitting or when up moving around? Up and moving around  4. Are you currently experiencing any other symptoms? Weakness,

## 2021-02-28 NOTE — Telephone Encounter (Signed)
Spoke with the patient's wife who states that the patient's BP has been elevated and he has been taking hydralazine 2 tablets in the evening for the past several nights. She states that this morning he woke up and his BP was 124/65. She states that the patient had some breakfast and went to sit in his recliner. She states that he complained of weakness and feeling fatigued. She states that she had to help him get out of his chair which she normally does not have to do. When she helped him up the patient became short of breath. He has continued to feel weak throughout the day. He did not eat lunch but has been drinking fluids. Denies any chest pain. Recent blood pressure was 174/79. She has reached out to the patient's PCP as well but has not heard back. He is scheduled for his annual visit with PCP next week on 3/1. Advised to continue with hydration and see if she can get the patient to eat something. Continue with blood pressure monitoring and medications. Will let Dr. Irish Lack know for further recommendations.

## 2021-03-01 NOTE — Telephone Encounter (Signed)
COntinue to monitor BP.  Since AM BPs are well controlled, I would continue to take hydralazine 20 mg for elevated systolic BP.  The weakness may be realted to something else.  His heart has been functioning well on recent tests.  I agree with f/u with PCP.

## 2021-03-03 NOTE — Telephone Encounter (Signed)
Spoke with pt's wife,DPR and advised of Dr Hassell Done comments and recommendations.  Pt's wife states pt was diagnosed with Covid on Friday and may help explain some of pt's symptoms.  Pt was started on Paxlovid and will follow up with PCP this Wednesday 03/05/2021.

## 2021-03-20 DIAGNOSIS — L814 Other melanin hyperpigmentation: Secondary | ICD-10-CM | POA: Diagnosis not present

## 2021-03-20 NOTE — Progress Notes (Signed)
? ? ?Assessment/Plan:  ? ?1.  ET/PD  ? -DaTscan abnormal with evidence of dopamine loss ? -Continue carbidopa/levodopa 25/100 CR, 1 tablet 3 times per day ? -add PT/OT ? -if above does not help balance/feeling of weakness, then will increase the carbidopa/levodopa in the future.   ? -We discussed that it used to be thought that levodopa would increase risk of melanoma but now it is believed that Parkinsons itself likely increases risk of melanoma. he is to get regular skin checks.  He just went recently.   ? -he asks about driving.  I think its okay for him to drive, non interstate, non night.  He is cognitively intact.  Reflex time slightly slowed.  Wife does most of driving ? ? ?2.  Gait disorder, multifactorial ? -Likely from peripheral neuropathy, cerebellar outflow tracts from essential tremor and potentially even Parkinson's. ? -discussed that foot numbness from PN and not from Parkinsons Disease  ? ? ? ? ? ?Subjective:  ? ?Johnny Fox was seen today in follow up for ET/Parkinsons disease.  This patient is accompanied in the office by his spouse who supplements the history.  Wife with patient and supplements hx.  My previous records were reviewed prior to todays visit as well as outside records available to me.  No lightheadedness or near syncope.  He did have COVID at the end of February and he was treated with paxlovid.  Since then, he has felt very weak.   He is tired just walking from mailbox to the house.   He had few falls during covid.  Last fall was about a month ago but he still feels unbalanced and weak.  He feels that tremor, surprisingly, is better.  Slower in and out of bed and slower to get dressed.   ?   ?Current prescribed movement disorder medications: ?Carbidopa/levodopa 25/100 CR, 1 tablet 3 times per day  ? ? ?PREVIOUS MEDICATIONS: Sinemet (just changed to see if it help fatigue - it didn't) ? ?ALLERGIES:   ?Allergies  ?Allergen Reactions  ? Chocolate Flavor Other (See Comments)   ?  sneezing  ? Food Other (See Comments)  ?  Chocolate-Sneezing  ? ? ?CURRENT MEDICATIONS:  ?Outpatient Encounter Medications as of 03/24/2021  ?Medication Sig  ? acetaminophen (TYLENOL) 325 MG tablet Take 325 mg by mouth every 4 (four) hours as needed for mild pain or headache.  ? Ascorbic Acid (VITAMIN C) 1000 MG tablet Take 1,000 mg by mouth daily.  ? aspirin EC 81 MG tablet Take 1 tablet (81 mg total) by mouth daily.  ? Benfotiamine 150 MG CAPS Take 150 mg by mouth daily.  ? Carbidopa-Levodopa ER (SINEMET CR) 25-100 MG tablet controlled release Take 1 tablet by mouth in the morning, at noon, and at bedtime. 7am/11am/4pm  ? cholecalciferol (VITAMIN D) 1000 UNITS tablet Take 1,000 Units by mouth daily.  ? Coenzyme Q10 (COQ-10) 100 MG CAPS Take 1 capsule by mouth daily.  ? Cyanocobalamin (B-12) 5000 MCG CAPS Take 1 tablet by mouth daily.  ? hydrALAZINE (APRESOLINE) 10 MG tablet Take 1 to 2 tablets as needed for systolic blood pressure over 150  ? naproxen sodium (ALEVE) 220 MG tablet Take 220 mg by mouth daily as needed (pain).  ? nitroGLYCERIN (NITROSTAT) 0.4 MG SL tablet Place 1 tablet (0.4 mg total) under the tongue every 5 (five) minutes as needed for chest pain.  ? Omega-3 Fatty Acids (FISH OIL) 1000 MG CAPS Take 1,000 mg by mouth 2 (two)  times daily after a meal.   ? rosuvastatin (CRESTOR) 5 MG tablet Take 5 mg by mouth. Take one tablet by mouth Monday through Friday.  ? TURMERIC PO Take 1 capsule by mouth 2 (two) times daily.   ? ?No facility-administered encounter medications on file as of 03/24/2021.  ? ? ?Objective:  ? ?PHYSICAL EXAMINATION:   ? ?VITALS:   ?Vitals:  ? 03/24/21 0800  ?BP: 139/79  ?Pulse: 78  ?SpO2: 99%  ?Weight: 179 lb 9.6 oz (81.5 kg)  ?Height: '5\' 10"'$  (1.778 m)  ? ? ? ? ?GEN:  The patient appears stated age and is in NAD. ?HEENT:  Normocephalic, atraumatic.  The mucous membranes are moist. The superficial temporal arteries are without ropiness or tenderness. ?CV:  RRR ?Lungs:   CTAB ?Neck/HEME:  There are no carotid bruits bilaterally. ? ?Neurological examination: ? ?Orientation: The patient is alert and oriented x3. ?Cranial nerves: There is good facial symmetry with facial hypomimia. The speech is fluent and clear. Soft palate rises symmetrically and there is no tongue deviation. Hearing is decreased to conversational tone. ?Sensation: Sensation is intact to light touch throughout ?Motor: Strength is at least antigravity x4. ? ?Movement examination: ?Tone: there is normal tone in the UE/LE ?Abnormal movements: no rest tremor.  Mild postural tremor ?Coordination:  There is mild  decremation with RAM's, with any form of RAMS, including alternating supination and pronation of the forearm, hand opening and closing, finger taps, heel taps and toe taps on the left. ?Gait and Station: The patient has min difficulty arising out of a deep-seated chair without the use of the hands.  He is dragging the L leg a bit and just a bit slower.   ? ? ?Total time spent on today's visit was 25 minutes, including both face-to-face time and nonface-to-face time.  Time included that spent on review of records (prior notes available to me/labs/imaging if pertinent), discussing treatment and goals, answering patient's questions and coordinating care. ? ?Cc:  Josetta Huddle, MD ?

## 2021-03-24 ENCOUNTER — Other Ambulatory Visit: Payer: Self-pay

## 2021-03-24 ENCOUNTER — Encounter: Payer: Self-pay | Admitting: Neurology

## 2021-03-24 ENCOUNTER — Ambulatory Visit (INDEPENDENT_AMBULATORY_CARE_PROVIDER_SITE_OTHER): Payer: Medicare Other | Admitting: Neurology

## 2021-03-24 VITALS — BP 139/79 | HR 78 | Ht 70.0 in | Wt 179.6 lb

## 2021-03-24 DIAGNOSIS — G20 Parkinson's disease: Secondary | ICD-10-CM | POA: Diagnosis not present

## 2021-03-24 DIAGNOSIS — I2510 Atherosclerotic heart disease of native coronary artery without angina pectoris: Secondary | ICD-10-CM

## 2021-03-24 MED ORDER — CARBIDOPA-LEVODOPA ER 25-100 MG PO TBCR: 1.0000 | EXTENDED_RELEASE_TABLET | Freq: Three times a day (TID) | ORAL | 1 refills | Status: DC

## 2021-03-24 NOTE — Patient Instructions (Signed)
Local and Online Resources for Power over Parkinson's Group ?March 2023 ? ?LOCAL Harvey PARKINSON'S GROUPS  ?Power over Parkinson's Group :   ?Power Over Parkinson's Patient Education Group will be Wednesday, March 8th-*Hybrid meting*- in person at [place] location and via [facility] at 2:00 pm.   ?Upcoming Power over Parkinson's Meetings:  2nd Wednesdays of the month at 2 pm:  March 8th, April 12th ?Contact Amy Marriott at [email or web address] if interested in participating in this group ?Parkinson's Care Partners Group:    3rd Mondays, Contact Misty Paladino ?Atypical Parkinsonian Patient Group:   4th Wednesdays, Contact Misty Paladino ?If you are interested in participating in these groups with Misty, please contact her directly for how to join those meetings.  Her contact information is [email or web address].   ? ?LOCAL EVENTS AND NEW OFFERINGS ?Parkinson's Wellness Event:  Pottery Night at [facility] [place].  Friday, March 10th 5:30-7:30 pm.  Sponsored by [employer] [place].  FREE event for people with Parkinson's and care partners.  RSVP to [facility] at [email or web address] ?Dine out at [employer].  Celebrate Parkinson's disease Awareness Month and Support the Parkinson's Movement Disorder Fund.   Wednesday, April 19th 4-6 pm at [place], [employer].  (Give receipt to cashier and 20% will be donated) ?Parkinson's T-shirts for sale!  Designed by a local group member, with funds going to [place].  $20.00  Contact Misty to purchase (see email above) ?New PWR! Moves Class offering at [employer]!  Fridays 1-2 pm in March (likely to switch days and times after March).  Come try it out and see if PWR! Moves is a good fit for your exercise routine!  Contact Amy Marriott for details:  [email or web address] ?Hamil-Kerr Challenge Bike, Run, Walk for PD, PSP, MSA.    Saturday, April 8th at 8 am.  [place]   Proceeds go to offset costs of exercise programs locally.  To Register, visit website:  www.[email or web address] ? ?ONLINE EDUCATION AND SUPPORT ?[place]:  www.parkinson.org ?PD Health at Home continues:  Mindfulness Mondays, Expert Briefing Tuesdays, Wellness Wednesdays, Take Time Thursdays, Fitness Fridays  ?Upcoming Education:  ?Parkinson's and Medications:  What's New.  Wednesday, March 8th at 1:00 pm. ?Freezing and Fall Prevention in Parkinson's.  Wednesday, April 12th at 1:00 pm ?Register for Armed forces operational officer) at WatchCalls.si ?[place] Chapter Parkinson's Symposium: Cognition Changes- a free in-person ([place], [place]) and online Audiological scientist) event for people with Parkinson's and their loved ones. During this event we will explore new treatments and practical strategies for addressing these changes.  Saturday, April 1st, 10 am-1 pm.  Register at [employer].[email or web address]/ or contact Dorothy Puffer Gruccio at [phone] or [email or web address]. ? Please check out their website to sign up for emails and see their full online offerings ? ?[place]:  [email or web address]  ?Third Thursday Webinars:  On the third Thursday of every month at 12 p.m. ET, join our free live webinars to learn about various aspects of living with Parkinson's disease and our work to speed medical breakthroughs. ?Upcoming Webinar:  The Power of Women in Philanthropy.  Tues, March 28th at  12 pm. ?Check out additional information on their website to see their full online offerings ? ?[place]:  www.[email or web address] ?Upcoming Webinar:   Stay tuned ?Webinar Series:  Living with Parkinson's Meetup.   Third Thursdays of each month at 3 pm ?Care Partner Monthly Meetup.  With Robin Searing Phinney.  First Tuesday of each month, 2 pm ?Check out additional information  to Live Well Today on their  website ? ?Parkinson and Movement Disorders (PMD) Alliance:  www.pmdalliance.org ?[employer] Online:  Online Education Events ?Sign up for emails, which are sent weekly to give you updates on programming and online offerings ? ?Parkinson's Association of the [place]:  www.parkinsonassociation.org ?Information on online support groups, education events, and online exercises including Yoga, Parkinson's exercises and more-LOTS of information on links to PD resources and online events ?Virtual Support Group through [employer] of the [place]; next one is scheduled for Wednesday, *April 5th at 2 pm. *March meeting has been cancelled. (These are typically scheduled for the 1st Wednesday of the month at 2 pm).  Visit website for details. ?MOVEMENT AND EXERCISE OPPORTUNITIES ?Parkinson's DRUMMING Classes/Music Therapy with Doylene Canning:  This is a returning class and it's FREE!  2nd Mondays, continuing March 13th , 11:00 at the [place].  Contact *Misty Taylor-Paladino at [email or web address] or Doylene Canning at [phone] or [email or web address]  ?PWR! Moves Classes at [place].  Wednesdays 10 and 11 am.   NEW PWR! Moves Class offering at [employer].  Fridays 1-2 pm.  Contact Amy Marriott, PT [email or web address] if interested. ?Here is a link to the PWR!Moves classes on Zoom from [place] - Daily Mon-Sat at 10:00. Via Zoom, FREE and open to all.  There is also a link below via Facebook if you use that platform. ?[email or web address].si ?[email or web address] ? ?Parkinson's Wellness Recovery (PWR! Moves)  www.pwr4life.org ?Info on the PWR! Virtual Experience:  You will have access to our expertise through  self-assessment, guided plans that start with the PD-specific fundamentals, educational content, tips, Q&A with an expert, and a growing Art therapist of PD-specific pre-recorded and live exercise classes of varying types and intensity - both physical and cognitive! If that is not enough, we offer 1:1 wellness consultations (in-person or virtual) to personalize your PWR! Research scientist (medical).  ?[employer] Fridays:  ?As part of the PD Health @ Home program, this free video series focuses each week on one aspect of fitness designed to support people living with Parkinson's.  These weekly videos highlight the [place] recent fitness guidelines for people with Parkinson's disease. ?www.KVTVnet.com.cy ?Dance for PD website is offering free, live-stream classes throughout the week, as well as links to [employer] of classes:  [email or web address]://[email or web address]/ ?Dance for Parkinson's in-person class.  February 1-April 26, Wednesdays 4-5 pm.  Free class for people with Parkinson's disease, at [address] [place], [place].  Contact [phone] or [email or web address] to register ?Virtual dance and Pilates for Parkinson's classes: Click on the Community Tab> Parkinson's Movement Initiative Tab.  To register for classes and for more information, visit www.[email or web address] and click the ?community? tab.  ?[employer] Parkinson's Cycling Classes  ?[employer]:  Thursdays @ Noon-Live classes at [employer] ([employer] at [email or web address] or [phone]) ?[employer]: Virtual Classes Mondays and Thursdays Jeanette Caprice classes Tuesday, Wednesday and Thursday (contact [place] at [email or web address]  or [phone]) ?[employer] ?Varied levels of classes are offered Mondays, Tuesdays and Thursdays at [employer].  ?Stretching with Verdis Frederickson weekly class is also offered for people with  Parkinson's ?To observe a class or for more information, call [phone] or email Hezzie Bump at [email or web address] ?ADDITIONAL SUPPORT AND RESOURCES ?[employer]:Online Caregiver Education Opportunities:

## 2021-03-26 ENCOUNTER — Other Ambulatory Visit: Payer: Self-pay

## 2021-03-26 ENCOUNTER — Encounter: Payer: Self-pay | Admitting: Podiatry

## 2021-03-26 ENCOUNTER — Ambulatory Visit (INDEPENDENT_AMBULATORY_CARE_PROVIDER_SITE_OTHER): Payer: Medicare Other | Admitting: Podiatry

## 2021-03-26 DIAGNOSIS — M79676 Pain in unspecified toe(s): Secondary | ICD-10-CM

## 2021-03-26 DIAGNOSIS — B351 Tinea unguium: Secondary | ICD-10-CM | POA: Diagnosis not present

## 2021-03-26 DIAGNOSIS — G629 Polyneuropathy, unspecified: Secondary | ICD-10-CM

## 2021-03-26 DIAGNOSIS — E119 Type 2 diabetes mellitus without complications: Secondary | ICD-10-CM

## 2021-03-26 DIAGNOSIS — I719 Aortic aneurysm of unspecified site, without rupture: Secondary | ICD-10-CM | POA: Insufficient documentation

## 2021-03-26 NOTE — Progress Notes (Signed)
This patient returns to my office for at risk foot care.  This patient requires this care by a professional since this patient will be at risk due to having  Diabetic neuropathy and Parkinsons.  This patient is unable to cut nails himself since the patient cannot reach his nails.These nails are painful walking and wearing shoes.  This patient presents for at risk foot care today.  General Appearance  Alert, conversant and in no acute stress.  Vascular  Dorsalis pedis and posterior tibial  pulses are palpable  bilaterally.  Capillary return is within normal limits  bilaterally. Temperature is within normal limits  bilaterally.  Neurologic  Senn-Weinstein monofilament wire diminished  bilaterally. Muscle power within normal limits bilaterally.  Nails Thick disfigured discolored nails with subungual debris  from hallux to fifth toes bilaterally. No evidence of bacterial infection or drainage bilaterally.  Orthopedic  No limitations of motion  feet .  No crepitus or effusions noted.  No bony pathology or digital deformities noted.  Skin  normotropic skin with no porokeratosis noted bilaterally.  No signs of infections or ulcers noted.     Onychomycosis  Pain in right toes  Pain in left toes  Consent was obtained for treatment procedures.   Mechanical debridement of nails 1-5  bilaterally performed with a nail nipper.  Filed with dremel without incident.    Return office visit   3 months                   Told patient to return for periodic foot care and evaluation due to potential at risk complications.   Zaylia Riolo DPM  

## 2021-03-27 DIAGNOSIS — I10 Essential (primary) hypertension: Secondary | ICD-10-CM | POA: Diagnosis not present

## 2021-03-27 DIAGNOSIS — G20 Parkinson's disease: Secondary | ICD-10-CM | POA: Diagnosis not present

## 2021-03-27 DIAGNOSIS — E1142 Type 2 diabetes mellitus with diabetic polyneuropathy: Secondary | ICD-10-CM | POA: Diagnosis not present

## 2021-03-27 DIAGNOSIS — Z0000 Encounter for general adult medical examination without abnormal findings: Secondary | ICD-10-CM | POA: Diagnosis not present

## 2021-03-27 DIAGNOSIS — E119 Type 2 diabetes mellitus without complications: Secondary | ICD-10-CM | POA: Diagnosis not present

## 2021-03-27 DIAGNOSIS — E559 Vitamin D deficiency, unspecified: Secondary | ICD-10-CM | POA: Diagnosis not present

## 2021-03-27 DIAGNOSIS — Z1389 Encounter for screening for other disorder: Secondary | ICD-10-CM | POA: Diagnosis not present

## 2021-04-09 ENCOUNTER — Ambulatory Visit: Payer: Medicare Other | Admitting: Physical Therapy

## 2021-04-09 ENCOUNTER — Encounter: Payer: Self-pay | Admitting: Physical Therapy

## 2021-04-09 ENCOUNTER — Encounter: Payer: Self-pay | Admitting: Occupational Therapy

## 2021-04-09 ENCOUNTER — Ambulatory Visit: Payer: Medicare Other | Attending: Neurology | Admitting: Occupational Therapy

## 2021-04-09 DIAGNOSIS — R29818 Other symptoms and signs involving the nervous system: Secondary | ICD-10-CM | POA: Insufficient documentation

## 2021-04-09 DIAGNOSIS — R41840 Attention and concentration deficit: Secondary | ICD-10-CM | POA: Diagnosis not present

## 2021-04-09 DIAGNOSIS — R2689 Other abnormalities of gait and mobility: Secondary | ICD-10-CM

## 2021-04-09 DIAGNOSIS — M6281 Muscle weakness (generalized): Secondary | ICD-10-CM | POA: Insufficient documentation

## 2021-04-09 DIAGNOSIS — R2681 Unsteadiness on feet: Secondary | ICD-10-CM | POA: Diagnosis not present

## 2021-04-09 DIAGNOSIS — R278 Other lack of coordination: Secondary | ICD-10-CM | POA: Diagnosis not present

## 2021-04-09 DIAGNOSIS — Z9181 History of falling: Secondary | ICD-10-CM | POA: Insufficient documentation

## 2021-04-09 DIAGNOSIS — R251 Tremor, unspecified: Secondary | ICD-10-CM | POA: Diagnosis not present

## 2021-04-09 NOTE — Therapy (Signed)
[facility] ?[address], [address] ?[place], [place], [address] ?Phone: [phone]   Fax:  [phone] ? ?Physical Therapy Evaluation ? ?Patient Details  ?Name: Johnny Fox ?MRN: [id number] ?Date of Birth: 10-28-1928 ?Referring Provider (PT): Tat, Eustace Quail, DO ? ? ?Encounter Date: 04/09/2021 ? ? PT End of Session - 04/09/21 1030   ? ? Visit Number 1   ? Number of Visits 17   ? Date for PT Re-Evaluation 06/04/21   ? Authorization Type Medicare/BCBS   ? PT Start Time [phone]   ? PT Stop Time 1020   ? PT Time Calculation (min) 42 min   ? Equipment Utilized During Treatment Gait belt   ? Activity Tolerance Patient tolerated treatment well   ? Behavior During Therapy [facility] for tasks assessed/performed   ? ?  ?  ? ?  ? ? ?Past Medical History:  ?Diagnosis Date  ? Actinic keratoses   ? and seborrheic keratoses  ? Arrhythmia   ? afib postoperatively after CABG in 2004, No recurrence  ? Coronary artery disease   ? Diabetes mellitus without complication ([place])   ? diet controlled  ? Dyslipidemia   ? ED (erectile dysfunction)   ? Elevated PSA   ? 2010, workup with Dr. Risa Grill  ? H/O prostate biopsy   ? Hypertension   ? Onychomycosis   ? Prostate cancer ([place])   ? PSA's run approx 7- 2011-2013  ? Right shoulder injury   ? july 2014, Dr. Lennette Bihari Supple  ? Tick bite of abdomen   ? 2013, treated with 3wks of doxycycline, tick engorged 3.22m nodule left thyroid 4/14- seen on carotid u/s  ? ? ?Past Surgical History:  ?Procedure Laterality Date  ? CARDIAC SURGERY    ? CATARACT EXTRACTION, BILATERAL    ? CORONARY ARTERY BYPASS GRAFT  2004  ? ? ?There were no vitals filed for this visit. ? ? ? Subjective Assessment - 04/09/21 0941   ? ? Subjective Patient reports that he was diagnosed with Parkinson?s in August 2021. Had COVID in February 2023 resulting in 3 falls, requiring fire department to help him up. Wife noticing more fatigue and weakness since then. Denies pattern to his falls, but feels  like he is going to fall forwards or with quick movements, trouble with curbs.  Noticing imbalance and slight tremor in the R>L UE. Also reports back pain when standing to shave or wash dishes. Denies freezing episodes. Currently using cane indoor/outdoor but left it at home; was using 4WW while he had COVID.   ? Patient is accompained by: Family member   wife  ? Pertinent History CAD, DM, HLD, HTN, prostate CA, CABG 2004; pt reports a bad R shoulder and would like to limit shoulder movements during PT tx   ? Diagnostic tests none recent   ? Patient Stated Goals improve balance   ? Currently in Pain? No/denies   ? ?  ?  ? ?  ? ? ? ? ? [facility] PT Assessment - 04/09/21 0946   ? ?  ? Assessment  ? Medical Diagnosis Parkinson's disease   ? Referring Provider (PT) Tat, REustace Quail DO   ? Onset Date/Surgical Date 08/06/19   ? Hand Dominance Right   ? Next MD Visit not scheduled   ? Prior Therapy yes- 2 years ago for balance   ?  ? Precautions  ? Precautions Fall   prostate CA, stable and not being currently treated  ?  ?  Balance Screen  ? Has the patient fallen in the past 6 months Yes   ? How many times? 3   ? Has the patient had a decrease in activity level because of a fear of falling?  Yes   ? Is the patient reluctant to leave their home because of a fear of falling?  No   ?  ? Home Environment  ? Living Environment Private residence   ? Living Arrangements Spouse/significant other   ? Available Help at Discharge Family   ? Type of Home House   ? Home Access Level entry   ? Home Layout One level   2 steps into sunroom with 1 HR  ? Retsof - single point;Walker - 4 wheels;Shower seat;Grab bars - tub/shower   walking  ?  ? Prior Function  ? Level of Independence Independent with basic ADLs   wife assists with dressing and grooming  ? Vocation Retired   ?  ? Cognition  ? Overall Cognitive Status Impaired/Different from baseline   pt reports trouble with memory and word finding  ?  ? Sensation  ? Light Touch  Appears Intact   reports neuropathy in B feet  ?  ? Coordination  ? Gross Motor Movements are Fluid and Coordinated No   ? Coordination and Movement Description alt pronation/supination dysmetria R>L   ? Finger Nose Finger Test dysmetria R>L   ?  ? Posture/Postural Control  ? Posture/Postural Control Postural limitations   ? Postural Limitations Rounded Shoulders;Forward head   ?  ? Tone  ? Assessment Location Right Lower Extremity;Left Lower Extremity   ?  ? ROM / Strength  ? AROM / PROM / Strength AROM;Strength   ?  ? Strength  ? Overall Strength Comments in sitting   ? Strength Assessment Site Hip;Knee;Ankle   ? Right/Left Hip Right;Left   ? Right Hip Flexion 4+/5   ? Right Hip ABduction 4/5   ? Right Hip ADduction 4/5   ? Left Hip Flexion 4+/5   ? Left Hip ABduction 4/5   ? Left Hip ADduction 4/5   ? Right/Left Knee Right;Left   ? Right Knee Flexion 4+/5   ? Right Knee Extension 4+/5   ? Left Knee Flexion 4/5   ? Left Knee Extension 4+/5   ? Right/Left Ankle Right;Left   ? Right Ankle Dorsiflexion 4+/5   ? Right Ankle Plantar Flexion 2+/5   ? Left Ankle Dorsiflexion 4+/5   ? Left Ankle Plantar Flexion 2+/5   ?  ? Ambulation/Gait  ? Ambulation/Gait No   ? Assistive device None   ? Gait Pattern Step-through pattern;Poor foot clearance - right;Poor foot clearance - left;Decreased step length - right;Decreased step length - left;Trunk flexed   ? Ambulation Surface Level;Indoor   ? Gait velocity slightly decreased   ?  ? Standardized Balance Assessment  ? Standardized Balance Assessment 10 meter walk test;Five Times Sit to Stand;Timed Up and Go Test   ? Five times sit to stand comments  12.97   without UEs; LOB on 1st rep  ? 10 Meter Walk 12.8   2.56 ft/sec  ?  ? Timed Up and Go Test  ? Normal TUG (seconds) 12.55   ? Manual TUG (seconds) 13.67   ? Cognitive TUG (seconds) 22.75   ? TUG Comments 81.3% increase on TUG cog, 8.9% increase on TUG motor   ?  ? RLE Tone  ? RLE Tone Moderate   ?  ? LLE  Tone  ? LLE Tone  Moderate   ? ?  ?  ? ?  ? ? ? ? ? ? ? ? ? ? ? ? ? ?Objective measurements completed on examination: See above findings.  ? ? ? ? ? ? ? ? ? ? ? ? ? ? PT Education - 04/09/21 1029   ? ? Education Details prognosis, POC, HEP-Access Code [email or web address]   ? Person(s) Educated Patient   ? Methods Explanation;Demonstration;Tactile cues;Verbal cues;Handout   ? Comprehension Verbalized understanding   ? ?  ?  ? ?  ? ? ? PT Short Term Goals - 04/09/21 1034   ? ?  ? PT SHORT TERM GOAL #1  ? Title Patient to be independent with initial HEP.   ? Time 3   ? Period Weeks   ? Status New   ? Target Date 04/30/21   ? ?  ?  ? ?  ? ? ? ? PT Long Term Goals - 04/09/21 1035   ? ?  ? PT LONG TERM GOAL #1  ? Title Patient to be independent with advanced HEP.   ? Time 8   ? Period Weeks   ? Status New   ? Target Date 06/04/21   ?  ? PT LONG TERM GOAL #2  ? Title Patient to improve MiniBestTest score to atleast 17 to decrease risk of falls.   ? Time 8   ? Period Weeks   ? Status New   ? Target Date 06/04/21   ?  ? PT LONG TERM GOAL #3  ? Title Patient to demonstrate <10% increase between TUG and TUG cognitive to improve ability to dual task in home/community.   ? Baseline 81.3% increase on TUG cog   ? Time 8   ? Period Weeks   ? Status New   ? Target Date 06/04/21   ?  ? PT LONG TERM GOAL #4  ? Title Patient to demonstrate gait speed of 2.62 ft/sec in order to score as community ambulator.   ? Baseline 2.56 ft/sec   ? Time 8   ? Period Weeks   ? Status New   ? Target Date 06/04/21   ?  ? PT LONG TERM GOAL #5  ? Title Patient to verbalize understanding of fall prevention in home environment information.   ? Time 8   ? Period Weeks   ? Status New   ? Target Date 06/04/21   ? ?  ?  ? ?  ? ? ? ? ? ? ? ? ? Plan - 04/09/21 1030   ? ? Clinical Impression Statement Patient is a 88 y/o M presenting to OPPT with wife with c/o imbalance, weakness, and falls since COVID infection in February 2023. Of note, patient diagnosed with Parkinson?s in August 2021.  Reports 3 falls in the past month, requiring EMS for floor transfer. Notes sensation of falling forward and notes imbalance with quick movements and navigating curbs. Patient?s wife reports noticing more fatigue and

## 2021-04-09 NOTE — Patient Instructions (Signed)
Access Code: [email or web address] ?URL: [email or web address] ?Date: 04/09/2021 ?Prepared by: [facility] ? ?Exercises ?- Sit to Stand with Arms Crossed  - 1 x daily - 5 x weekly - 2 sets - 10 reps ?- Heel Raises with Counter Support  - 1 x daily - 5 x weekly - 2 sets - 10 reps ?- Standing Hip Abduction with Counter Support  - 1 x daily - 5 x weekly - 2 sets - 10 reps ?

## 2021-04-13 NOTE — Therapy (Signed)
[place] ?[facility] ?[address], [address] ?[place], [place], [address] ?Phone: [phone]   Fax:  [phone] ? ?Occupational Therapy Evaluation ? ?Patient Details  ?Name: Johnny Fox ?MRN: [id number] ?Date of Birth: 12-15-1928 ?No data recorded ? ?Encounter Date: 04/09/2021 ? ? OT End of Session - 04/09/21 1025   ?     ?  Visit Number 1   ?  Number of Visits  17  ?  Date for OT Re-Evaluation  06/14/2021   ?  Authorization Type  Medicare / BCBS supplemental   ?  OT Start Time  1020   ?  OT Stop Time 1110   ?  OT Time Calculation (min) 50 min   ?  Activity Tolerance  Patient tolerated treatment well   ?  Behavior During Therapy  [facility] for tasks assessed/performed  ? ?Past Medical History:  ?Diagnosis Date  ? Actinic keratoses   ? and seborrheic keratoses  ? Arrhythmia   ? afib postoperatively after CABG in 2004, No recurrence  ? Coronary artery disease   ? Diabetes mellitus without complication ([place])   ? diet controlled  ? Dyslipidemia   ? ED (erectile dysfunction)   ? Elevated PSA   ? 2010, workup with Dr. Risa Grill  ? H/O prostate biopsy   ? Hypertension   ? Onychomycosis   ? Prostate cancer ([place])   ? PSA's run approx 7- 2011-2013  ? Right shoulder injury   ? july 2014, Dr. Lennette Bihari Supple  ? Tick bite of abdomen   ? 2013, treated with 3wks of doxycycline, tick engorged 3.59m nodule left thyroid 4/14- seen on carotid u/s  ? ? ?Past Surgical History:  ?Procedure Laterality Date  ? CARDIAC SURGERY    ? CATARACT EXTRACTION, BILATERAL    ? CORONARY ARTERY BYPASS GRAFT  2004  ? ? ?There were no vitals filed for this visit. ? ? ? Subjective Assessment - 04/09/21 1029   ?     ?  Subjective Pt arrives to session w/ primary concerns due to PD, which he reports he was diagnosed with about 2 years ago, as well as overall weakness impacting participation in ADLs. Pt and his wife state he has not had prior therapy (implied pt means no prior OT). He has had a few falls within the past couple of  months, which pt and spouse believe were related to COVID infection in February, and also report he has not felt as strong since then.  ?  Patient is accompanied by: Family member   Wife (Johnny Fox  ?  Pertinent History ET/PD diagnosed approx May 2021; PMH includes repeated falls, CAD, HTN, afib s/p CABG in 2004, hx of prostate cancer, DMT2, and chronic R shoulder pain  ?  Limitations Hearing loss  ?  Patient Stated Goals Get as much out of therapy as possible  ?  Currently in Pain? No/denies  ? ? ? [facility]30  ?   ?  Assessment   ?   Medical Diagnosis Parkinson's Disease  ?   Referring Provider (OT) RAlonza Bogus DO  ?   Onset Date/Surgical Date --   Approx date of diagnosis, May 2021  ?   Hand Dominance Right  ?   Next MD Visit 09/09/21  ?   Prior Therapy Yes; PT  ?  Precautions   ?   Precautions Fall  ?  Balance Screen   ?   Has the patient fallen  in the past 6 months Yes  ?   How many times? 3  ?  Home Environment   ?   Type of Home House  ?   Home Access Other (Comment)   Level entry in the back; a few steps to enter the front  ?   Home Layout One level  ?   Bathroom Building control surveyor;Tub/Shower unit   Only uses walk-in  ?   Home Equipment Shower seat - built in;Grab bars - tub/shower;Grab bars - toilet;Cane - single point;Walker - 4 wheels;BSC  ?  Prior Function   ?   Level of Independence Needs assistance with ADLs;Needs assistance with gait   ?  ADL   ?   Eating/Feeding Modified independent   15.62 sec to move 5 beans from bowl to container  ?   Grooming Minimal assistance   Requires assist w/ hair due to shoulder pain  ?   Upper Body Bathing Modified independent  ?   Lower Body Bathing Modified independent  ?   Upper Body Dressing Increased time   44.56 to complete 4 buttons  ?   Lower Body Dressing Increased time  ?  IADL   ?   Shopping Shops independently for [employer]  ?   Light Housekeeping Performs light daily tasks such as dishwashing, bed making  ?   Meal Prep Able  to complete simple warm meal prep; Able to complete simple cold meal and snack prep   Did not do cooking prior   ?   Programmer, applications own vehicle   Familar routes; no long distance, no interstates, no nighttime driving   ?   Medication Management Is responsible for taking medication in correct dosages at correct time  ?  Mobility Status   ?   Mobility Status History of falls  ?  Vision Assessment  ?   Comment Reports no difficulty w/ vision  ?  Activity Tolerance  ?   Activity Tolerance Comments Able to stand about 5-10 minutes before back starts hurting  ?  Cognition   ?   Overall Cognitive Status Cognition to be further assessed in functional context PRN  ?   Area of Impairment Memory  ?   Memory Decreased short-term memory  ?   Memory Comments Reports difficulty w/ remembering names, conversations  ?  Observation/Other Assessments   ?   Standing Functional Reach Test To be assessed  ?  Coordination   ?   9 Hole Peg Test Right; Left  ?   Right 9 Hole Peg Test 53.90 sec  ?   Left 9 Hole Peg Test 51.06 sec  ?  Strength   ?   Strength Assessment Site Shoulder;Elbow;Forearm  ?   Right/Left Shoulder Right; Left   R shoulder limited due to pain (grossly 3+/5); LUE WFL (grossly 4+5)  ?   Right/Left Elbow Right; Left   Grossly 4+/5  ?   Right/Left Forearm Right; Left   Grossly 4+/5  ?  Hand Function   ?   Right Hand Grip (lbs) 50 lbs  ?   Right Hand Lateral Pinch 19 lbs  ?   Right Hand 3 Point Pinch 13 lbs  ?   Left Hand Grip (lbs) 51 lbs  ?   Left Hand Lateral Pinch 18 lbs  ?   Left 3 point pinch 12 lbs  ? ? ? OT Education - 04/09/21 1115   ?     ?  Education  Details Education provided on role and purpose of OT, as well as potential interventions and goals for therapy based on initial evaluation findings. Briefly introduced condition-specific education.  ?  Person(s) Educated Patient; Spouse  ?  Methods Explanation  ?  Comprehension Verbalized understanding  ? ? ? OT Short Term Goals - 04/09/21 1429   ? ?  ? OT  SHORT TERM GOAL #1  ? Title Pt will demonstrate independence w/ condition-specific HEP   ? Baseline No HEP at this time   ? Time 4   ? Period Weeks   ? Status New   ? Target Date 05/07/21   ?  ? OT SHORT TERM GOAL #2  ? Title Pt will verbalize understanding of strategies to decrease risk of further complication related to PD, including corresponding energy conservation strategies to improve participation in IADLs   ? Baseline Decreased knowledge of condition-specific education   ? Time 4   ? Period Weeks   ? Status New   ? Target Date 05/07/21   ?  ? OT SHORT TERM GOAL #3  ? Title Pt will demonstrate independence w/ recommended AE to improve independence w/ self-care/grooming tasks (long handled sponge, reacher, adaptive comb, etc)   ? Baseline Decreased knowledge of AE   ? Time 4   ? Period Weeks   ? Status New   ? Target Date 05/07/21   ? ?  ?  ? ?  ? ? OT Long Term Goals - 04/09/21 1430   ? ?  ? OT LONG TERM GOAL #1  ? Title Pt will improve participation in self-feeding as evidenced by decreasing time to move 5 beans from a bowl w/ a spoon, incorporating compensatory strategies/AE prn   ? Baseline 15.62 sec   ? Time 8   ? Period Weeks   ? Status New   ? Target Date 06/04/21   ?  ? OT LONG TERM GOAL #2  ? Title Pt will demonstrate increased ease w/ manipulation of clothing fasteners by decreasing time to complete 4 buttons by at least 5 sec   ? Baseline 44.56 sec to button 4 shirt buttons   ? Time 8   ? Period Weeks   ? Status New   ? Target Date 06/04/21   ?  ? OT LONG TERM GOAL #3  ? Title Pt will demonstrate independence w/ at least 1 memory compensatory strategy by d/c   ? Baseline Reports difficulty w/ short-term memory (e.g., names, conversations)   ? Time 8   ? Period Weeks   ? Status New   ? Target Date 06/04/21   ?  ? OT LONG TERM GOAL #4  ? Title Standing Functional Reach Test goal - to be assessed   ? Baseline TBD   ? Time 8   ? Period Weeks   ? Status New   ? Target Date 06/04/21   ? ?  ?  ? ?  ? ?  Plan - 04/09/21 1128  ?      ?  Clinical Impression Statement Pt is a 88 y/o male who presents to OP OT due to symptoms of essential tremor/PD, as well as generalized weakness and decreased activity tolerance

## 2021-04-14 ENCOUNTER — Ambulatory Visit: Payer: Medicare Other | Admitting: Physical Therapy

## 2021-04-14 ENCOUNTER — Encounter: Payer: Self-pay | Admitting: Occupational Therapy

## 2021-04-14 ENCOUNTER — Ambulatory Visit: Payer: Medicare Other | Admitting: Occupational Therapy

## 2021-04-14 ENCOUNTER — Encounter: Payer: Self-pay | Admitting: Physical Therapy

## 2021-04-14 DIAGNOSIS — R251 Tremor, unspecified: Secondary | ICD-10-CM | POA: Diagnosis not present

## 2021-04-14 DIAGNOSIS — R2689 Other abnormalities of gait and mobility: Secondary | ICD-10-CM

## 2021-04-14 DIAGNOSIS — R29818 Other symptoms and signs involving the nervous system: Secondary | ICD-10-CM

## 2021-04-14 DIAGNOSIS — R41840 Attention and concentration deficit: Secondary | ICD-10-CM

## 2021-04-14 DIAGNOSIS — M6281 Muscle weakness (generalized): Secondary | ICD-10-CM

## 2021-04-14 DIAGNOSIS — R2681 Unsteadiness on feet: Secondary | ICD-10-CM | POA: Diagnosis not present

## 2021-04-14 DIAGNOSIS — Z9181 History of falling: Secondary | ICD-10-CM | POA: Diagnosis not present

## 2021-04-14 NOTE — Therapy (Signed)
[place] ?[facility] ?[address], [address] ?[place], [place], [address] ?Phone: [phone]   Fax:  [phone] ? ?Physical Therapy Treatment ? ?Patient Details  ?Name: Johnny Fox ?MRN: [id number] ?Date of Birth: May 24, 1928 ?Referring Provider (PT): Tat, Eustace Quail, DO ? ? ?Encounter Date: 04/14/2021 ? ? PT End of Session - 04/14/21 1410   ? ? Visit Number 2   ? Number of Visits 17   ? Date for PT Re-Evaluation 06/04/21   ? Authorization Type Medicare/BCBS   ? PT Start Time 7681   ? PT Stop Time 1400   ? PT Time Calculation (min) 43 min   ? Equipment Utilized During Treatment Gait belt   ? Activity Tolerance Patient tolerated treatment well   ? Behavior During Therapy [facility] for tasks assessed/performed   ? ?  ?  ? ?  ? ? ?Past Medical History:  ?Diagnosis Date  ? Actinic keratoses   ? and seborrheic keratoses  ? Arrhythmia   ? afib postoperatively after CABG in 2004, No recurrence  ? Coronary artery disease   ? Diabetes mellitus without complication ([place])   ? diet controlled  ? Dyslipidemia   ? ED (erectile dysfunction)   ? Elevated PSA   ? 2010, workup with Dr. Risa Grill  ? H/O prostate biopsy   ? Hypertension   ? Onychomycosis   ? Prostate cancer ([place])   ? PSA's run approx 7- 2011-2013  ? Right shoulder injury   ? july 2014, Dr. Lennette Bihari Supple  ? Tick bite of abdomen   ? 2013, treated with 3wks of doxycycline, tick engorged 3.71m nodule left thyroid 4/14- seen on carotid u/s  ? ? ?Past Surgical History:  ?Procedure Laterality Date  ? CARDIAC SURGERY    ? CATARACT EXTRACTION, BILATERAL    ? CORONARY ARTERY BYPASS GRAFT  2004  ? ? ?There were no vitals filed for this visit. ? ? Subjective Assessment - 04/14/21 1314   ? ? Subjective Doing pretty good. Denies recent falls. HEP is going well.   ? Pertinent History CAD, DM, HLD, HTN, prostate CA, CABG 2004; pt reports a bad R shoulder and would like to limit shoulder movements during PT tx   ? Diagnostic tests none recent   ? Patient  Stated Goals improve balance   ? Currently in Pain? No/denies   ? ?  ?  ? ?  ? ? ? ? ? [facility] PT Assessment - 04/14/21 0001   ? ?  ? Standardized Balance Assessment  ? Standardized Balance Assessment Mini-BESTest   ?  ? Mini-BESTest  ? Sit To Stand Normal: Comes to stand without use of hands and stabilizes independently.   ? Rise to Toes < 3 s.   ? Stand on one leg (left) Moderate: < 20 s   ? Stand on one leg (right) Moderate: < 20 s   ? Stand on one leg - lowest score 1   ? Compensatory Stepping Correction - Forward Moderate: More than one step is required to recover equilibrium   ? Compensatory Stepping Correction - Backward No step, OR would fall if not caught, OR falls spontaneously.   ? Compensatory Stepping Correction - Left Lateral Moderate: Several steps to recover equilibrium   ? Compensatory Stepping Correction - Right Lateral Normal: Recovers independently with 1 step (crossover or lateral OK)   ? Stepping Corredtion Lateral - lowest score 1   ? Stance - Feet together, eyes open, firm surface  Normal: 30s   ? Stance - Feet together, eyes closed, foam surface  Severe: Unable   LOB requiring assist with EC on foam  ? Incline - Eyes Closed Severe: Unable   ? Change in Gait Speed Normal: Significantly changes walkling speed without imbalance   ? Walk with head turns - Horizontal Moderate: performs head turns with reduction in gait speed.   ? Walk with pivot turns Moderate:Turns with feet close SLOW (>4 steps) with good balance.   ? Step over obstacles Normal: Able to step over box with minimal change of gait speed and with good balance.   ? Timed UP & GO with Dual Task Moderate: Dual Task affects either counting OR walking (>10%) when compared to the TUG without Dual Task.   ? Mini-BEST total score 14   ? ?  ?  ? ?  ? ? ? ? ? ? ? ? ? ? ? ? ? ? ? ? [facility] Adult PT Treatment/Exercise - 04/14/21 0001   ? ?  ? Ambulation/Gait  ? Ambulation Distance (Feet) 258 Feet   ? Assistive device --   patient's short walking  pole and clinic's longer walking pole  ? Gait Pattern Step-through pattern;Poor foot clearance - right;Poor foot clearance - left;Decreased step length - right;Decreased step length - left;Trunk flexed   ? Ambulation Surface Level;Indoor   ? Gait velocity slightly decreased   ? Gait Comments cueing for alt reciprocal sequencing, step through pattern, tall posture   ? ?  ?  ? ?  ? ? ? ? ? ? Balance Exercises - 04/14/21 0001   ? ?  ? Balance Exercises: Standing  ? Standing Eyes Opened Wide (BOA);Foam/compliant surface;2 reps;30 secs   ? Stepping Strategy Anterior;Posterior;UE support   15x each whie weaning UE support; cues to increase posterior step length  ? Other Standing Exercises Comments alt toe tap on cone x20 wit CGA   ? ?  ?  ? ?  ? ? ? ? ? PT Education - 04/14/21 1410   ? ? Education Details update to HEP and safety performing these at home-Access Code [email or web address]; edu on testing performed today   ? Person(s) Educated Patient   ? Methods Explanation;Demonstration;Tactile cues;Verbal cues;Handout   ? Comprehension Verbalized understanding;Returned demonstration   ? ?  ?  ? ?  ? ? ? PT Short Term Goals - 04/14/21 1412   ? ?  ? PT SHORT TERM GOAL #1  ? Title Patient to be independent with initial HEP.   ? Time 3   ? Period Weeks   ? Status On-going   ? Target Date 04/30/21   ? ?  ?  ? ?  ? ? ? ? PT Long Term Goals - 04/14/21 1412   ? ?  ? PT LONG TERM GOAL #1  ? Title Patient to be independent with advanced HEP.   ? Time 8   ? Period Weeks   ? Status On-going   ? Target Date 06/04/21   ?  ? PT LONG TERM GOAL #2  ? Title Patient to improve MiniBestTest score to atleast 17 to decrease risk of falls.   ? Time 8   ? Period Weeks   ? Status On-going   ? Target Date 06/04/21   ?  ? PT LONG TERM GOAL #3  ? Title Patient to demonstrate <10% increase between TUG and TUG cognitive to improve ability to dual task in home/community.   ? Baseline 81.3% increase  on TUG cog   ? Time 8   ? Period Weeks   ? Status On-going   ?  Target Date 06/04/21   ?  ? PT LONG TERM GOAL #4  ? Title Patient to demonstrate gait speed of 2.62 ft/sec in order to score as community ambulator.   ? Baseline 2.56 ft/sec   ? Time 8   ? Period Weeks   ? Status On-going   ? Target Date 06/04/21   ?  ? PT LONG TERM GOAL #5  ? Title Patient to verbalize understanding of fall prevention in home environment information.   ? Time 8   ? Period Weeks   ? Status On-going   ? Target Date 06/04/21   ? ?  ?  ? ?  ? ? ? ? ? ? ? ? Plan - 04/14/21 1411   ? ? Clinical Impression Statement Patient arrived to session without new complaints and denies recent falls. Patient scored 14/28 on MiniBest Test, indicating an increased risk of falls. Most challenge was demonstrated with heel raises, posterior reactive balance, and standing on compliant or unlevel surfaces. Worked on activities to challenge balance based on identified impairments; patient was able to perform these activities safely but with some hesitancy. Trialed gait training, with patient demonstrating improved posture with clinic?s taller walking pole compared with patient?s shorter one. Patient mentioned trouble with bed mobility at end of session, thus plan to address this next session.   ? Comorbidities CAD, DM, HLD, HTN, prostate CA, CABG 2004   ? PT Treatment/Interventions ADLs/Self Care Home Management;Canalith Repostioning;Cryotherapy;Electrical Stimulation;DME Instruction;Moist Heat;Gait training;Stair training;Functional mobility training;Therapeutic activities;Therapeutic exercise;Balance training;Neuromuscular re-education;Manual techniques;Patient/family education;Passive range of motion;Dry needling;Energy conservation;Vestibular;Taping   ? PT Next Visit Plan bed mobility, progress calf strength, compliant surface balance, reactive balance, asses gait with cane, edu on fall prevention   ? Consulted and Agree with Plan of Care Patient   ? ?  ?  ? ?  ? ? ?Patient will benefit from skilled therapeutic  intervention in order to improve the following deficits and impairments:  Abnormal gait, Decreased coordination, Difficulty walking, Impaired tone, Decreased safety awareness, Decreased endurance, Decreased activity tole

## 2021-04-14 NOTE — Patient Instructions (Signed)
Access Code: [email or web address] ?URL: [email or web address] ?Date: 04/14/2021 ?Prepared by: [facility] ? ?Exercises ?- Hand AROM Composite Flexion  - 1 x daily - 5 x weekly - 2 sets - 10 reps ?- Wrist Flexion Extension AROM - Palms Down  - 1 x daily - 5 x weekly - 2 sets - 10 reps ?- Forearm Pronation and Supination AROM  - 1 x daily - 5 x weekly - 2 sets - 10 reps ?- Thumb Opposition  - 1 x daily - 5 x weekly - 2 sets - 10 reps ?- Seated Finger Flicks with Elbow Extension  - 1 x daily - 5 x weekly - 2 sets - 10 reps ?

## 2021-04-14 NOTE — Therapy (Signed)
[place] ?[address], [address] ?[place], [place], [address] ?Phone: [phone]   Fax:  [phone] ? ?Occupational Therapy Treatment ? ?Patient Details  ?Name: Johnny Fox ?MRN: [id number] ?Date of Birth: 02-Feb-1928 ?Referring Provider (OT): Johnny Bogus, DO ? ? ?Encounter Date: 04/14/2021 ? ? OT End of Session - 04/14/21 1023   ? ? Visit Number 2   ? Number of Visits 17   ? Date for OT Re-Evaluation 06/14/21   ? Authorization Type Medicare / BCBS supplemental   ? OT Start Time 1017   ? OT Stop Time 1100   ? OT Time Calculation (min) 43 min   ? Activity Tolerance Patient tolerated treatment well   ? Behavior During Therapy [facility] for tasks assessed/performed   ? ?  ?  ? ?  ? ? ?Past Medical History:  ?Diagnosis Date  ? Actinic keratoses   ? and seborrheic keratoses  ? Arrhythmia   ? afib postoperatively after CABG in 2004, No recurrence  ? Coronary artery disease   ? Diabetes mellitus without complication ([place])   ? diet controlled  ? Dyslipidemia   ? ED (erectile dysfunction)   ? Elevated PSA   ? 2010, workup with Dr. Risa Fox  ? H/O prostate biopsy   ? Hypertension   ? Onychomycosis   ? Prostate cancer ([place])   ? PSA's run approx 7- 2011-2013  ? Right shoulder injury   ? july 2014, Dr. Lennette Bihari Fox  ? Tick bite of abdomen   ? 2013, treated with 3wks of doxycycline, tick engorged 3.50m nodule left thyroid 4/14- seen on carotid u/s  ? ? ?Past Surgical History:  ?Procedure Laterality Date  ? CARDIAC SURGERY    ? CATARACT EXTRACTION, BILATERAL    ? CORONARY ARTERY BYPASS GRAFT  2004  ? ? ?There were no vitals filed for this visit. ? ? Subjective Assessment - 04/14/21 1020   ? ? Subjective  Pt reports going out to lunch with friends and family over the weekend for his birthday.   ? Patient is accompanied by: Family member   Wife (Johnny Fox  ? Pertinent History ET/PD diagnosed approx May 2021; PMH includes repeated falls, CAD, HTN, afib s/p CABG in 2004, hx of prostate cancer,  DMT2, and chronic R shoulder pain   ? Limitations Hearing loss   ? Patient Stated Goals Get as much out of therapy as possible   ? Currently in Pain? No/denies   ? ?  ?  ? ?  ? ? ?ADL: Discussed challenges with donning jacket, fastening buttons.  Pt reports daughter purchased 2 shirts with magnetic buttons for his birthday.  Educated on functional carryover of large amplitude movements with self-care tasks.  Pt reports h/o R shoulder issues.  Further education on orthopedic injury and impact of PD on reduced ROM impacting functional reach and dressing.   ? ?Large amplitude movements:  Educated on large amplitude movements and carryover for ADLs and IADLs.  Engaged in 1 set of 10 each hand flexion/extension, wrist flexion/extension, forearm pronation/supination, thumb opposition, and seated finger flicks with elbow extension.  Therapist providing cues and instruction for quality of movement to increase ROM within tolerance.  Educated on use of finger flicks in various directions - forward, to R and L, and overhead.  Pt with decreased ability to complete overhead due to painful R shoulder.   ? ?Handwriting: Educated on use of print vs cursive during handwriting to increase legibility and sizing.  Pt demonstrating improvements in size and legibility with use of lined paper.  Discussed use of lined paper or note cards when writing notes to family members. ? ? ? ? ? ? ? ? ? ? ? ? ? ? ? ? ? ? OT Education - 04/14/21 1124   ? ? Education Details Provided education on purpose and focus of therapy for PD and focus on large amplitude movements during gross and fine motor tasks.   ? Person(s) Educated Patient;Spouse   ? Methods Explanation   ? Comprehension Verbalized understanding   ? ?  ?  ? ?  ? ? ? OT Short Term Goals - 04/14/21 1024   ? ?  ? OT SHORT TERM GOAL #1  ? Title Pt will demonstrate independence w/ condition-specific HEP   ? Baseline No HEP at this time   ? Time 4   ? Period Weeks   ? Status On-going   ? Target  Date 05/07/21   ?  ? OT SHORT TERM GOAL #2  ? Title Pt will verbalize understanding of strategies to decrease risk of further complication related to PD, including corresponding energy conservation strategies to improve participation in IADLs   ? Baseline Decreased knowledge of condition-specific education   ? Time 4   ? Period Weeks   ? Status On-going   ? Target Date 05/07/21   ?  ? OT SHORT TERM GOAL #3  ? Title Pt will demonstrate independence w/ recommended AE to improve independence w/ self-care/grooming tasks (long handled sponge, reacher, adaptive comb, etc)   ? Baseline Decreased knowledge of AE   ? Time 4   ? Period Weeks   ? Status On-going   ? Target Date 05/07/21   ? ?  ?  ? ?  ? ? ? ? OT Long Term Goals - 04/14/21 1025   ? ?  ? OT LONG TERM GOAL #1  ? Title Pt will improve participation in self-feeding as evidenced by decreasing time to move 5 beans from a bowl w/ a spoon, incorporating compensatory strategies/AE prn   ? Baseline 15.62 sec   ? Time 8   ? Period Weeks   ? Status On-going   ? Target Date 06/04/21   ?  ? OT LONG TERM GOAL #2  ? Title Pt will demonstrate increased ease w/ manipulation of clothing fasteners by decreasing time to complete 4 buttons by at least 5 sec   ? Baseline 44.56 sec to button 4 shirt buttons   ? Time 8   ? Period Weeks   ? Status On-going   ? Target Date 06/04/21   ?  ? OT LONG TERM GOAL #3  ? Title Pt will demonstrate independence w/ at least 1 memory compensatory strategy by d/c   ? Baseline Reports difficulty w/ short-term memory (e.g., names, conversations)   ? Time 8   ? Period Weeks   ? Status On-going   ? Target Date 06/04/21   ?  ? OT LONG TERM GOAL #4  ? Title Pt will be able to reach forward 6" at moderate range to engage in IADLs with improved ease and without increased pain.   ? Baseline 5" on functional reach test   ? Time 8   ? Period Weeks   ? Status On-going   ? Target Date 06/04/21   ? ?  ?  ? ?  ? ? ? ? ? ? ? ? Plan - 04/14/21 1024   ? ?  Clinical  Impression Statement Pt responsive to education on large amplitude movements.  Pt requiring min cues for technique during hand flicks with elbow exension and supination/pronation.  Educating on effects of PD on quality of movement and use of large amplitude movements to carryover to fine and gross motor movements during ADLs and IADLs.  Pt limited in overhead and forward ROM due to R shoulder pain. Discussed orthopedic and PD impairments impacting shoulder mobility.   ? OT Occupational Profile and History Detailed Assessment- Review of Records and additional review of physical, cognitive, psychosocial history related to current functional performance   ? Occupational performance deficits (Please refer to evaluation for details): ADL's;IADL's;Rest and Sleep;Leisure   ? Body Structure / Function / Physical Skills ADL;Dexterity;ROM;Strength;Balance;Coordination;FMC;IADL;Body mechanics;Endurance;UE functional use;Decreased knowledge of use of DME;GMC;Mobility   ? Cognitive Skills Memory   ? Psychosocial Skills Environmental  Adaptations   ? Rehab Potential Good   ? Clinical Decision Making Several treatment options, min-mod task modification necessary   ? Comorbidities Affecting Occupational Performance: Presence of comorbidities impacting occupational performance   ? Modification or Assistance to Complete Evaluation  Min-Moderate modification of tasks or assist with assess necessary to complete eval   ? OT Frequency 2x / week   ? OT Duration 8 weeks   ? OT Treatment/Interventions Self-care/ADL training;Therapeutic exercise;DME and/or AE instruction;Functional Mobility Training;Cognitive remediation/compensation;Balance training;Aquatic Therapy;Neuromuscular education;Manual Therapy;Visual/perceptual remediation/compensation;Energy conservation;Therapeutic activities;Patient/family education   ? Plan getting in/out of bed, shoulder stretches within pain tolerance; continue introducing PD-specific HEP ([place], coordination  activities, etc.); condition-specific education   ? OT Home Exercise Plan [email or web address]   ? Consulted and Agree with Plan of Care Patient;Family member/caregiver   ? Family Member Consulted Wife Johnny Fox)   ? ?

## 2021-04-16 ENCOUNTER — Encounter: Payer: Self-pay | Admitting: Physical Therapy

## 2021-04-16 ENCOUNTER — Ambulatory Visit: Payer: Medicare Other | Admitting: Physical Therapy

## 2021-04-16 ENCOUNTER — Ambulatory Visit: Payer: Medicare Other | Admitting: Occupational Therapy

## 2021-04-16 ENCOUNTER — Encounter: Payer: Self-pay | Admitting: Occupational Therapy

## 2021-04-16 DIAGNOSIS — R2681 Unsteadiness on feet: Secondary | ICD-10-CM

## 2021-04-16 DIAGNOSIS — M6281 Muscle weakness (generalized): Secondary | ICD-10-CM | POA: Diagnosis not present

## 2021-04-16 DIAGNOSIS — R29818 Other symptoms and signs involving the nervous system: Secondary | ICD-10-CM

## 2021-04-16 DIAGNOSIS — Z9181 History of falling: Secondary | ICD-10-CM | POA: Diagnosis not present

## 2021-04-16 DIAGNOSIS — R41840 Attention and concentration deficit: Secondary | ICD-10-CM

## 2021-04-16 DIAGNOSIS — R251 Tremor, unspecified: Secondary | ICD-10-CM | POA: Diagnosis not present

## 2021-04-16 NOTE — Therapy (Signed)
[place] [facility] ?[address], [address] ?[place], [place], [address] ?Phone: [phone]   Fax:  [phone] ? ?Physical Therapy Treatment ? ?Patient Details  ?Name: Johnny Fox ?MRN: [id number] ?Date of Birth: October 10, 1928 ?Referring Provider (PT): Tat, Eustace Quail, DO ? ? ?Encounter Date: 04/16/2021 ? ? PT End of Session - 04/16/21 1019   ? ? Visit Number 3   ? Number of Visits 17   ? Date for PT Re-Evaluation 06/04/21   ? Authorization Type Medicare/BCBS   ? PT Start Time [phone]   ? PT Stop Time 0929   ? PT Time Calculation (min) 43 min   ? Activity Tolerance Patient tolerated treatment well   ? Behavior During Therapy [facility] for tasks assessed/performed   ? ?  ?  ? ?  ? ? ?Past Medical History:  ?Diagnosis Date  ? Actinic keratoses   ? and seborrheic keratoses  ? Arrhythmia   ? afib postoperatively after CABG in 2004, No recurrence  ? Coronary artery disease   ? Diabetes mellitus without complication ([place])   ? diet controlled  ? Dyslipidemia   ? ED (erectile dysfunction)   ? Elevated PSA   ? 2010, workup with Dr. Risa Grill  ? H/O prostate biopsy   ? Hypertension   ? Onychomycosis   ? Prostate cancer ([place])   ? PSA's run approx 7- 2011-2013  ? Right shoulder injury   ? july 2014, Dr. Lennette Bihari Supp[place]  ? Tick bite of abdomen   ? 2013, treated with 3wks of doxycycline, tick engorged 3.60m nodule left thyroid 4/14- seen on carotid u/s  ? ? ?Past Surgical History:  ?Procedure Laterality Date  ? CARDIAC SURGERY    ? CATARACT EXTRACTION, BILATERAL    ? CORONARY ARTERY BYPASS GRAFT  2004  ? ? ?There were no vitals filed for this visit. ? ? Subjective Assessment - 04/16/21 0846   ? ? Subjective Doing pretty good. denies falls. Brought in his cane. The R shoulder bothered him a little bit during his boxing class yesterday.   ? Pertinent History CAD, DM, HLD, HTN, prostate CA, CABG 2004; pt reports a bad R shoulder and would like to limit shoulder movements during PT tx   ? Diagnostic tests none  recent   ? Patient Stated Goals improve balance   ? Currently in Pain? No/denies   ? ?  ?  ? ?  ? ? ? ? ? ? ? ? ? ? ? ? ? ? ? ? ? ? ? ? OBoardmanAdult PT Treatment/Exercise - 04/16/21 0001   ? ?  ? Bed Mobility  ? Bed Mobility Rolling Right;Rolling Left;Right Sidelying to Sit;Left Sidelying to Sit;Sit to Sidelying Right   ? Rolling Right Supervision/verbal cueing   cues to reach opposite arm across, bring hips over, stack ankles  ? Rolling Left Supervision/Verbal cueing   cues to reach opposite arm across, bring hips over, stack ankles  ? Right Sidelying to Sit Supervision/Verbal cueing   cues to kicks LEs off and then push upper body up  ? Left Sidelying to Sit Supervision/Verbal cueing   cues to kicks LEs off and then push upper body up  ? Sit to Sidelying Right --   cues to lower shoulder down, then kick LEs up  ?  ? Ambulation/Gait  ? Ambulation Distance (Feet) 215 Feet   ? Assistive device --   hurricane  ? Gait Pattern Step-through pattern;Poor foot clearance - right;Poor foot  clearance - left;Decreased step length - right;Decreased step length - left;Trunk flexed   ? Ambulation Surface Level;Indoor   ? Gait velocity slightly decreased   ? Gait Comments gait training with patient's cane with good AD sequencing and good carryover of cues to increase step length   ?  ? Exercises  ? Exercises Lumbar   ?  ? Lumbar Exercises: Stretches  ? Other Lumbar Stretch Exercise R/L open book stretch 10x to tolerance   cues to maintain straight elbow and knees together  ? ?  ?  ? ?  ? ? ? ? ? ? ? ? ? ? PT Education - 04/16/21 1018   ? ? Education Details update to HEP-Access Code [email or web address]; edu on fall prevention info   ? Person(s) Educated Patient;Spouse   ? Methods Explanation;Demonstration;Tactile cues;Verbal cues;Handout   ? Comprehension Verbalized understanding;Returned demonstration   ? ?  ?  ? ?  ? ? ? PT Short Term Goals - 04/14/21 1412   ? ?  ? PT SHORT TERM GOAL #1  ? Title Patient to be independent with initial HEP.    ? Time 3   ? Period Weeks   ? Status On-going   ? Target Date 04/30/21   ? ?  ?  ? ?  ? ? ? ? PT Long Term Goals - 04/14/21 1412   ? ?  ? PT LONG TERM GOAL #1  ? Title Patient to be independent with advanced HEP.   ? Time 8   ? Period Weeks   ? Status On-going   ? Target Date 06/04/21   ?  ? PT LONG TERM GOAL #2  ? Title Patient to improve MiniBestTest score to atleast 17 to decrease risk of falls.   ? Time 8   ? Period Weeks   ? Status On-going   ? Target Date 06/04/21   ?  ? PT LONG TERM GOAL #3  ? Title Patient to demonstrate <10% increase between TUG and TUG cognitive to improve ability to dual task in home/community.   ? Baseline 81.3% increase on TUG cog   ? Time 8   ? Period Weeks   ? Status On-going   ? Target Date 06/04/21   ?  ? PT LONG TERM GOAL #4  ? Title Patient to demonstrate gait speed of 2.62 ft/sec in order to score as community ambulator.   ? Baseline 2.56 ft/sec   ? Time 8   ? Period Weeks   ? Status On-going   ? Target Date 06/04/21   ?  ? PT LONG TERM GOAL #5  ? Title Patient to verbalize understanding of fall prevention in home environment information.   ? Time 8   ? Period Weeks   ? Status On-going   ? Target Date 06/04/21   ? ?  ?  ? ?  ? ? ? ? ? ? ? ? Plan - 04/16/21 1019   ? ? Clinical Impression Statement Patient arrived to session ambulating with cane. Reviewed gait training with patient?s cane with much improved posture and gait pattern evident compared to walking stick last session. Patient was able to demonstrate increased step length after cueing provided. Remainder of session focused on bed mobility with cueing to utilize opposite UE and hip to assist with full roll R/L. Patient with some difficulty managing LEs with supine<>sit today but improved with cues. Worked on trunk rotation activities to improve ease with bed mobility. Educated patient on fall prevention  information- patient and wife reported understanding. Patient tolerated session well and without complaints at end of  session.   ? Comorbidities CAD, DM, HLD, HTN, prostate CA, CABG 2004   ? PT Treatment/Interventions ADLs/Self Care Home Management;Canalith Repostioning;Cryotherapy;Electrical Stimulation;DME Instruction;Moist Heat;Gait training;Stair training;Functional mobility training;Therapeutic activities;Therapeutic exercise;Balance training;Neuromuscular re-education;Manual techniques;Patient/family education;Passive range of motion;Dry needling;Energy conservation;Vestibular;Taping   ? PT Next Visit Plan bed mobility, progress calf strength, compliant surface balance, reactive balance, asses gait with cane, edu on fall prevention   ? Consulted and Agree with Plan of Care Patient   ? ?  ?  ? ?  ? ? ?Patient will benefit from skilled therapeutic intervention in order to improve the following deficits and impairments:  Abnormal gait, Decreased coordination, Difficulty walking, Impaired tone, Decreased safety awareness, Decreased endurance, Decreased activity tolerance, Pain, Decreased balance, Decreased knowledge of use of DME, Impaired flexibility, Improper body mechanics, Postural dysfunction, Decreased strength ? ?Visit Diagnosis: ?Muscle weakness (generalized) ? ?Other symptoms and signs involving the nervous system ? ?Unsteadiness on feet ? ? ? ? ?Problem List ?Patient Active Problem List  ? Diagnosis Date Noted  ? Aortic aneurysm ([place]) 03/26/2021  ? Abnormal gait 03/13/2020  ? Bronchitis 03/13/2020  ? Chin laceration 03/13/2020  ? Cough 03/13/2020  ? Cramp and spasm 03/13/2020  ? Disorder of penis 03/13/2020  ? Dizzy spells 03/13/2020  ? Dyspnea 03/13/2020  ? Edema of lower extremity 03/13/2020  ? Elevated PSA 03/13/2020  ? Encounter for general adult medical examination without abnormal findings 03/13/2020  ? Hearing loss in left ear 03/13/2020  ? History of malignant neoplasm of prostate 03/13/2020  ? Impacted cerumen 03/13/2020  ? Low back pain 03/13/2020  ? Malignant tumor of prostate ([place]) 03/13/2020  ? Neuropathy  03/13/2020  ? Orthostatic hypotension 03/13/2020  ? Other long term (current) drug therapy 03/13/2020  ? Other specified abnormal findings of blood chemistry 03/13/2020  ? Other sprain of right hip, initial

## 2021-04-16 NOTE — Therapy (Signed)
[place] [facility] ?[address], [address] ?[place], [place], [address] ?Phone: [phone]   Fax:  [phone] ? ?Occupational Therapy Treatment ? ?Patient Details  ?Name: Johnny Fox ?MRN: [id number] ?Date of Birth: 1928/12/11 ?Referring Provider (OT): Alonza Bogus, DO ? ? ?Encounter Date: 04/16/2021 ? ? OT End of Session - 04/16/21 0932   ? ? Visit Number 3   ? Number of Visits 17   ? Date for OT Re-Evaluation 06/14/21   ? Authorization Type Medicare / BCBS supplemental   ? OT Start Time 0930   ? OT Stop Time 1014   ? OT Time Calculation (min) 44 min   ? Activity Tolerance Patient tolerated treatment well   ? Behavior During Therapy [facility] for tasks assessed/performed   ? ?  ?  ? ?  ? ? ?Past Medical History:  ?Diagnosis Date  ? Actinic keratoses   ? and seborrheic keratoses  ? Arrhythmia   ? afib postoperatively after CABG in 2004, No recurrence  ? Coronary artery disease   ? Diabetes mellitus without complication ([place])   ? diet controlled  ? Dyslipidemia   ? ED (erectile dysfunction)   ? Elevated PSA   ? 2010, workup with Dr. Risa Grill  ? H/O prostate biopsy   ? Hypertension   ? Onychomycosis   ? Prostate cancer ([place])   ? PSA's run approx 7- 2011-2013  ? Right shoulder injury   ? july 2014, Dr. Lennette Bihari Supple  ? Tick bite of abdomen   ? 2013, treated with 3wks of doxycycline, tick engorged 3.73m nodule left thyroid 4/14- seen on carotid u/s  ? ? ?Past Surgical History:  ?Procedure Laterality Date  ? CARDIAC SURGERY    ? CATARACT EXTRACTION, BILATERAL    ? CORONARY ARTERY BYPASS GRAFT  2004  ? ? ?There were no vitals filed for this visit. ? ? Subjective Assessment - 04/16/21 0932   ? ? Subjective  Pt reports going to his [employer] boxing class yesterday and was worn out afterwards.   ? Patient is accompanied by: Family member   Wife ([name]  ? Pertinent History ET/PD diagnosed approx May 2021; PMH includes repeated falls, CAD, HTN, afib s/p CABG in 2004, hx of prostate cancer,  DMT2, and chronic R shoulder pain   ? Limitations Hearing loss   ? Patient Stated Goals Get as much out of therapy as possible   ? Currently in Pain? No/denies   ? ?  ?  ? ?  ? ? ?Large amplitude movements: Reiterated exercises from previous session with focus on large amplitude movements at wrist, forearm, and elbow.  Pt demonstrating decreased wrist extension on R.  Pt reports increased pain in R shoulder with hand flicks with elbow extension.  Therapist modified movement to continue focusing on ROM while decreasing the momentum from the original exercise to allow for continued for on tolerance of movement with slow purposeful reach.   ? ?Card flipping and dealing: cues for large amplitude with focus on supination and opening of hand when releasing cards.  Noted guarding at shoulder due to pain.  Pt reports enjoying playing cards and noticing decreased amplitude of reach with repetition when dealing for [place].  Reiterated increased focus on amplitude and large reach during familiar, functional tasks to facilitate carryover of tasks.   ? ?Shoulder ROM with passive and active stretch at pecs and shoulder girdle:  Engaged in 1 set of 10 each open book chest stretch, thoracic mobilization  with chest stretch, PNF D2, and lower trunk rotation all in supine.  Educated on completion in supine to allow for increased mobility without pain and focus on quality of movement without compensatory patterns emerging.  Therapist providing intermittent tactile cues at R shoulder for improved quality of movement and cues for hold in position to maintain stretch.  Pt with no increase in pain with movements. ? ? ? ? ? ? ? ? ? ? ? ? ? ? ? ? ? ? ? ? ? ? OT Education - 04/16/21 1015   ? ? Education Details Provided education on movement and stretch through shoulder and modifications of task to allow for continued ROM without the force to allow for pain free movement   ? Person(s) Educated Patient;Spouse   ? Methods  Explanation;Demonstration   ? Comprehension Verbalized understanding   ? ?  ?  ? ?  ? ? ? OT Short Term Goals - 04/14/21 1024   ? ?  ? OT SHORT TERM GOAL #1  ? Title Pt will demonstrate independence w/ condition-specific HEP   ? Baseline No HEP at this time   ? Time 4   ? Period Weeks   ? Status On-going   ? Target Date 05/07/21   ?  ? OT SHORT TERM GOAL #2  ? Title Pt will verbalize understanding of strategies to decrease risk of further complication related to PD, including corresponding energy conservation strategies to improve participation in IADLs   ? Baseline Decreased knowledge of condition-specific education   ? Time 4   ? Period Weeks   ? Status On-going   ? Target Date 05/07/21   ?  ? OT SHORT TERM GOAL #3  ? Title Pt will demonstrate independence w/ recommended AE to improve independence w/ self-care/grooming tasks (long handled sponge, reacher, adaptive comb, etc)   ? Baseline Decreased knowledge of AE   ? Time 4   ? Period Weeks   ? Status On-going   ? Target Date 05/07/21   ? ?  ?  ? ?  ? ? ? ? OT Long Term Goals - 04/14/21 1025   ? ?  ? OT LONG TERM GOAL #1  ? Title Pt will improve participation in self-feeding as evidenced by decreasing time to move 5 beans from a bowl w/ a spoon, incorporating compensatory strategies/AE prn   ? Baseline 15.62 sec   ? Time 8   ? Period Weeks   ? Status On-going   ? Target Date 06/04/21   ?  ? OT LONG TERM GOAL #2  ? Title Pt will demonstrate increased ease w/ manipulation of clothing fasteners by decreasing time to complete 4 buttons by at least 5 sec   ? Baseline 44.56 sec to button 4 shirt buttons   ? Time 8   ? Period Weeks   ? Status On-going   ? Target Date 06/04/21   ?  ? OT LONG TERM GOAL #3  ? Title Pt will demonstrate independence w/ at least 1 memory compensatory strategy by d/c   ? Baseline Reports difficulty w/ short-term memory (e.g., names, conversations)   ? Time 8   ? Period Weeks   ? Status On-going   ? Target Date 06/04/21   ?  ? OT LONG TERM GOAL  #4  ? Title Pt will be able to reach forward 6" at moderate range to engage in IADLs with improved ease and without increased pain.   ? Baseline 5" on  functional reach test   ? Time 8   ? Period Weeks   ? Status On-going   ? Target Date 06/04/21   ? ?  ?  ? ?  ? ? ? ? ? ? ? ? Plan - 04/16/21 0933   ? ? Clinical Impression Statement Pt requiring modification for finger flicks with elbow extension due to onset of pain in R shoulder.  Pt tolerated supine exercises with focus on shoulder and pec mobility and stretch to increase ROM while decreasing pain. Reiterated quality of movement and importance of movement with PD.  Discussed orthopedic and PD impairments impacting shoulder mobility.   ? OT Occupational Profile and History Detailed Assessment- Review of Records and additional review of physical, cognitive, psychosocial history related to current functional performance   ? Occupational performance deficits (Please refer to evaluation for details): ADL's;IADL's;Rest and Sleep;Leisure   ? Body Structure / Function / Physical Skills ADL;Dexterity;ROM;Strength;Balance;Coordination;FMC;IADL;Body mechanics;Endurance;UE functional use;Decreased knowledge of use of DME;GMC;Mobility   ? Cognitive Skills Memory   ? Psychosocial Skills Environmental  Adaptations   ? Rehab Potential Good   ? Clinical Decision Making Several treatment options, min-mod task modification necessary   ? Comorbidities Affecting Occupational Performance: May have comorbidities impacting occupational performance   ? Modification or Assistance to Complete Evaluation  Min-Moderate modification of tasks or assist with assess necessary to complete eval   ? OT Frequency 2x / week   ? OT Duration 8 weeks   ? OT Treatment/Interventions Self-care/ADL training;Therapeutic exercise;DME and/or AE instruction;Functional Mobility Training;Cognitive remediation/compensation;Balance training;Aquatic Therapy;Neuromuscular education;Manual Therapy;Visual/perceptual  remediation/compensation;Energy conservation;Therapeutic activities;Patient/family education   ? Plan getting in/out of bed, shoulder stretches within pain tolerance; continue introducing PD-specific HEP ([place], coordination activ

## 2021-04-16 NOTE — Patient Instructions (Addendum)

## 2021-04-16 NOTE — Patient Instructions (Signed)
Access Code: [email or web address] ?URL: [email or web address] ?Date: 04/16/2021 ?Prepared by: [facility] ? ?Exercises ?- Open Book Chest Stretch on Towel Roll  - 1 x daily - 7 x weekly - 1 sets - 10 reps ?- Supine Thoracic Mobilization Towel Roll Vertical with Arm Stretch  - 1 x daily - 7 x weekly - 1 sets - 10 reps - 3-5 sec hold ?- Supine PNF D2  - 1 x daily - 7 x weekly - 1 sets - 10 reps - 3-5 sec hold ?- Supine Lower Trunk Rotation  - 1 x daily - 7 x weekly - 1 sets - 10 reps ?

## 2021-04-21 ENCOUNTER — Encounter: Payer: Self-pay | Admitting: Occupational Therapy

## 2021-04-21 ENCOUNTER — Ambulatory Visit: Payer: Medicare Other | Admitting: Physical Therapy

## 2021-04-21 ENCOUNTER — Encounter: Payer: Self-pay | Admitting: Physical Therapy

## 2021-04-21 ENCOUNTER — Ambulatory Visit: Payer: Medicare Other | Admitting: Occupational Therapy

## 2021-04-21 VITALS — BP 109/76 | HR 107

## 2021-04-21 DIAGNOSIS — R2681 Unsteadiness on feet: Secondary | ICD-10-CM

## 2021-04-21 DIAGNOSIS — R251 Tremor, unspecified: Secondary | ICD-10-CM

## 2021-04-21 DIAGNOSIS — M6281 Muscle weakness (generalized): Secondary | ICD-10-CM

## 2021-04-21 DIAGNOSIS — R29818 Other symptoms and signs involving the nervous system: Secondary | ICD-10-CM | POA: Diagnosis not present

## 2021-04-21 DIAGNOSIS — R2689 Other abnormalities of gait and mobility: Secondary | ICD-10-CM

## 2021-04-21 DIAGNOSIS — R41840 Attention and concentration deficit: Secondary | ICD-10-CM

## 2021-04-21 DIAGNOSIS — Z9181 History of falling: Secondary | ICD-10-CM | POA: Diagnosis not present

## 2021-04-21 NOTE — Therapy (Signed)
[place] [facility] ?[address], [address] ?[place], [place], [address] ?Phone: [phone]   Fax:  [phone] ? ?Physical Therapy Treatment ? ?Patient Details  ?Name: Johnny Fox ?MRN: [id number] ?Date of Birth: 1928/02/01 ?Referring Provider (PT): Tat, Eustace Quail, DO ? ? ?Encounter Date: 04/21/2021 ? ? PT End of Session - 04/21/21 0932   ? ? Visit Number 4   ? Number of Visits 17   ? Date for PT Re-Evaluation 06/04/21   ? Authorization Type Medicare/BCBS   ? PT Start Time [phone]   ? PT Stop Time [phone]   ? PT Time Calculation (min) 42 min   ? Equipment Utilized During Treatment Gait belt   ? Activity Tolerance Patient tolerated treatment well   ? Behavior During Therapy [facility] for tasks assessed/performed   ? ?  ?  ? ?  ? ? ?Past Medical History:  ?Diagnosis Date  ? Actinic keratoses   ? and seborrheic keratoses  ? Arrhythmia   ? afib postoperatively after CABG in 2004, No recurrence  ? Coronary artery disease   ? Diabetes mellitus without complication ([place])   ? diet controlled  ? Dyslipidemia   ? ED (erectile dysfunction)   ? Elevated PSA   ? 2010, workup with Dr. Risa Grill  ? H/O prostate biopsy   ? Hypertension   ? Onychomycosis   ? Prostate cancer ([place])   ? PSA's run approx 7- 2011-2013  ? Right shoulder injury   ? july 2014, Dr. Lennette Bihari Supple  ? Tick bite of abdomen   ? 2013, treated with 3wks of doxycycline, tick engorged 3.13m nodule left thyroid 4/14- seen on carotid u/s  ? ? ?Past Surgical History:  ?Procedure Laterality Date  ? CARDIAC SURGERY    ? CATARACT EXTRACTION, BILATERAL    ? CORONARY ARTERY BYPASS GRAFT  2004  ? ? ?Vitals:  ? 04/21/21 0908  ?BP: 109/76  ?Pulse: (!) 107  ?SpO2: 97%  ? ? ? Subjective Assessment - 04/21/21 0848   ? ? Subjective Doing pretty good. No falls. Still feels like his balance isn't there.   ? Pertinent History CAD, DM, HLD, HTN, prostate CA, CABG 2004; pt reports a bad R shoulder and would like to limit shoulder movements during PT tx   ?  Diagnostic tests none recent   ? Patient Stated Goals improve balance   ? Currently in Pain? No/denies   ? ?  ?  ? ?  ? ? ? ? ? ? ? ? ? ? ? ? ? ? ? ? ? ? ? ? [place]Exercise - 04/21/21 0001   ? ?  ? Neuro Re-ed   ? Neuro Re-ed Details  bending to pick up cone from floor + sidestep 2x4 with CGA; R/L fwd/back stepping with/without 1 UE support 10x each with CGA- more challenge on R LE   cues to shift weight anteriorly and widen BOS  ?  ? Lumbar Exercises: Aerobic  ? Nustep L2x5 min UEs/LEs (cues for pacing to maintain ~ 75 SPM throughout d/t patient quick to faitgue)   started at L4 but dropped down d/t c/o fatigue; rest break at 2.5 min  ?  ? Lumbar Exercises: Standing  ? Other Standing Lumbar Exercises squats at stair rail 2x10   good form; cues to avoid hanging on hands  ? ?  ?  ? ?  ? ? ? ? ? ? Balance Exercises - 04/21/21 0001   ? ?  ?  Balance Exercises: Standing  ? Standing Eyes Opened Foam/compliant surface;Wide (BOA);Narrow base of support (BOS);4 reps;30 secs   with intermittent UE support as needed  ? ?  ?  ? ?  ? ? ? ? ? PT Education - 04/21/21 0929   ? ? Education Details educated patient and wife on importance of proper hydration with exercise   ? Person(s) Educated Patient   ? Methods Explanation   ? Comprehension Verbalized understanding   ? ?  ?  ? ?  ? ? ? PT Short Term Goals - 04/14/21 1412   ? ?  ? PT SHORT TERM GOAL #1  ? Title Patient to be independent with initial HEP.   ? Time 3   ? Period Weeks   ? Status On-going   ? Target Date 04/30/21   ? ?  ?  ? ?  ? ? ? ? PT Long Term Goals - 04/14/21 1412   ? ?  ? PT LONG TERM GOAL #1  ? Title Patient to be independent with advanced HEP.   ? Time 8   ? Period Weeks   ? Status On-going   ? Target Date 06/04/21   ?  ? PT LONG TERM GOAL #2  ? Title Patient to improve MiniBestTest score to atleast 17 to decrease risk of falls.   ? Time 8   ? Period Weeks   ? Status On-going   ? Target Date 06/04/21   ?  ? PT LONG TERM GOAL #3  ? Title  Patient to demonstrate <10% increase between TUG and TUG cognitive to improve ability to dual task in home/community.   ? Baseline 81.3% increase on TUG cog   ? Time 8   ? Period Weeks   ? Status On-going   ? Target Date 06/04/21   ?  ? PT LONG TERM GOAL #4  ? Title Patient to demonstrate gait speed of 2.62 ft/sec in order to score as community ambulator.   ? Baseline 2.56 ft/sec   ? Time 8   ? Period Weeks   ? Status On-going   ? Target Date 06/04/21   ?  ? PT LONG TERM GOAL #5  ? Title Patient to verbalize understanding of fall prevention in home environment information.   ? Time 8   ? Period Weeks   ? Status On-going   ? Target Date 06/04/21   ? ?  ?  ? ?  ? ? ? ? ? ? ? ? Plan - 04/21/21 0932   ? ? Clinical Impression Statement Patient arrived to session without new complaints. Arrived ambulating with cane with improved posture and sequencing. Worked on static balance activities on compliant surface with mild-moderate anterior/posterior sway evident.  Patient mentioned difficulty maintaining balance when bending forward to pick an object off the ground, thus worked on squats and forward leaning activities to address this. Reported c/o dizziness which lasted ~1 minute and improved with sitting water break. Vitals WNL- HR slightly elevated as patient had been exercising.  Educated on proper hydration during exercise. Patient also with report of diplopia for several years; worse when wearing bifocals and when performing quick head turns. Denied dizziness with forward bending; patient required cues to shift weight anteriorly and widen BOS. Patient reported mild dizziness at end of session; provided sitting rest break until symptoms dissipated. No further complaints at end of session.   ? Comorbidities CAD, DM, HLD, HTN, prostate CA, CABG 2004   ? PT Treatment/Interventions  ADLs/Self Care Home Management;Canalith Repostioning;Cryotherapy;Electrical Stimulation;DME Instruction;Moist Heat;Gait training;Stair  training;Functional mobility training;Therapeutic activities;Therapeutic exercise;Balance training;Neuromuscular re-education;Manual techniques;Patient/family education;Passive range of motion;Dry needling;Energy conservation;Vestibular;Taping   ? PT Next Visit Plan forward banding activities while monitoring dizziness; bed mobility, progress calf strength, compliant surface balance, reactive balance, asses gait with cane, edu on fall prevention   ? Consulted and Agree with Plan of Care Patient   ? ?  ?  ? ?  ? ? ?Patient will benefit from skilled therapeutic intervention in order to improve the following deficits and impairments:  Abnormal gait, Decreased coordination, Difficulty walking, Impaired tone, Decreased safety awareness, Decreased endurance, Decreased activity tolerance, Pain, Decreased balance, Decreased knowledge of use of DME, Impaired flexibility, Improper body mechanics, Postural dysfunction, Decreased strength ? ?Visit Diagnosis: ?Muscle weakness (generalized) ? ?Other abnormalities of gait and mobility ? ?Unsteadiness on feet ? ? ? ? ?Problem List ?Patient Active Problem List  ? Diagnosis Date Noted  ? Aortic aneurysm ([place]) 03/26/2021  ? Abnormal gait 03/13/2020  ? Bronchitis 03/13/2020  ? Chin laceration 03/13/2020  ? Cough 03/13/2020  ? Cramp and spasm 03/13/2020  ? Disorder of penis 03/13/2020  ? Dizzy spells 03/13/2020  ? Dyspnea 03/13/2020  ? Edema of lower extremity 03/13/2020  ? Elevated PSA 03/13/2020  ? Encounter for general adult medical examination without abnormal findings 03/13/2020  ? Hearing loss in left ear 03/13/2020  ? History of malignant neoplasm of prostate 03/13/2020  ? Impacted cerumen 03/13/2020  ? Low back pain 03/13/2020  ? Malignant tumor of prostate ([place]) 03/13/2020  ? Neuropathy 03/13/2020  ? Orthostatic hypotension 03/13/2020  ? Other long term (current) drug therapy 03/13/2020  ? Other specified abnormal findings of blood chemistry 03/13/2020  ? Other sprain of right  hip, initial encounter 03/13/2020  ? Paresthesia 03/13/2020  ? Parkinson's disease ([place]) 03/13/2020  ? Paroxysmal atrial fibrillation ([place]) 03/13/2020  ? Polyneuropathy due to type 2 diabetes mellitus ([place]) 03/13/2020  ? Pre

## 2021-04-21 NOTE — Therapy (Signed)
[place] ?[address], [address] ?[place], [place], [address] ?Phone: [phone]   Fax:  [phone] ? ?Occupational Therapy Treatment ? ?Patient Details  ?Name: Johnny Fox ?MRN: [id number] ?Date of Birth: [birth year] ?Referring Provider (OT): Alonza Bogus, DO ? ? ?Encounter Date: 04/21/2021 ? ? OT End of Session - 04/21/21 9323   ? ? Visit Number 4   ? Number of Visits 17   ? Date for OT Re-Evaluation 06/14/21   ? Authorization Type Medicare / BCBS supplemental   ? OT Start Time [phone]   ? OT Stop Time 1017   ? OT Time Calculation (min) 43 min   ? Activity Tolerance Patient tolerated treatment well   ? Behavior During Therapy [facility] for tasks assessed/performed   ? ?  ?  ? ?  ? ? ?Past Medical History:  ?Diagnosis Date  ? Actinic keratoses   ? and seborrheic keratoses  ? Arrhythmia   ? afib postoperatively after CABG in 2004, No recurrence  ? Coronary artery disease   ? Diabetes mellitus without complication ([place])   ? diet controlled  ? Dyslipidemia   ? ED (erectile dysfunction)   ? Elevated PSA   ? 2010, workup with Dr. Risa Grill  ? H/O prostate biopsy   ? Hypertension   ? Onychomycosis   ? Prostate cancer ([place])   ? PSA's run approx 7- 2011-2013  ? Right shoulder injury   ? july 2014, Dr. Lennette Bihari Supple  ? Tick bite of abdomen   ? 2013, treated with 3wks of doxycycline, tick engorged 3.14m nodule left thyroid 4/14- seen on carotid u/s  ? ? ?Past Surgical History:  ?Procedure Laterality Date  ? CARDIAC SURGERY    ? CATARACT EXTRACTION, BILATERAL    ? CORONARY ARTERY BYPASS GRAFT  2004  ? ? ?There were no vitals filed for this visit. ? ? Subjective Assessment - 04/21/21 0936   ? ? Subjective  Pt reports a "little dizzy" after doing some activity during PT.   ? Patient is accompanied by: Family member   Wife ([name]  ? Pertinent History ET/PD diagnosed approx May 2021; PMH includes repeated falls, CAD, HTN, afib s/p CABG in 2004, hx of prostate cancer, DMT2, and chronic R  shoulder pain   ? Limitations Hearing loss   ? Patient Stated Goals Get as much out of therapy as possible   ? Currently in Pain? No/denies   ? ?  ?  ? ?  ? ? ? ?ADL: Discussed and demonstrated elastic shoelaces vs shoe buttons for increased ease and independence with fastening shoes in figure 4 position.  Therapist also demonstrated long handled shoe horn and shoe funnel to aid in donning shoe as needed. Pt reports having a short and long handled shoe horn that he uses as needed as well as demonstrated ability to achieve figure 4 positioning during dressing, so may benefit most from elastic shoe laces or shoe buttons.  Therapist provided locations at which pt may be able to purchase either items.  ? ?Engaged in simulated self-feeding with built up handle and modified grips.  Pt reports increased tremor with self-feeding, especially cereal.  Pt engaged in simulated self-feeding with standard spoon.  Therapist educated on modifying hand placement/grip closer up handle.  Pt demonstrating mild decrease in tremor impacting feeding.  Therapist had pt trial variety of sizes of built up handles with pt and therapist settling on medium built up handle foam.  Pt to take  handle home and utilize during meals to further assess with meals.   ? ? ? ? ? ? ? ? ? ? ? ? ? ? ? ? ? ? ? ? ? OT Short Term Goals - 04/14/21 1024   ? ?  ? OT SHORT TERM GOAL #1  ? Title Pt will demonstrate independence w/ condition-specific HEP   ? Baseline No HEP at this time   ? Time 4   ? Period Weeks   ? Status On-going   ? Target Date 05/07/21   ?  ? OT SHORT TERM GOAL #2  ? Title Pt will verbalize understanding of strategies to decrease risk of further complication related to PD, including corresponding energy conservation strategies to improve participation in IADLs   ? Baseline Decreased knowledge of condition-specific education   ? Time 4   ? Period Weeks   ? Status On-going   ? Target Date 05/07/21   ?  ? OT SHORT TERM GOAL #3  ? Title Pt will  demonstrate independence w/ recommended AE to improve independence w/ self-care/grooming tasks (long handled sponge, reacher, adaptive comb, etc)   ? Baseline Decreased knowledge of AE   ? Time 4   ? Period Weeks   ? Status On-going   ? Target Date 05/07/21   ? ?  ?  ? ?  ? ? ? ? OT Long Term Goals - 04/14/21 1025   ? ?  ? OT LONG TERM GOAL #1  ? Title Pt will improve participation in self-feeding as evidenced by decreasing time to move 5 beans from a bowl w/ a spoon, incorporating compensatory strategies/AE prn   ? Baseline 15.62 sec   ? Time 8   ? Period Weeks   ? Status On-going   ? Target Date 06/04/21   ?  ? OT LONG TERM GOAL #2  ? Title Pt will demonstrate increased ease w/ manipulation of clothing fasteners by decreasing time to complete 4 buttons by at least 5 sec   ? Baseline 44.56 sec to button 4 shirt buttons   ? Time 8   ? Period Weeks   ? Status On-going   ? Target Date 06/04/21   ?  ? OT LONG TERM GOAL #3  ? Title Pt will demonstrate independence w/ at least 1 memory compensatory strategy by d/c   ? Baseline Reports difficulty w/ short-term memory (e.g., names, conversations)   ? Time 8   ? Period Weeks   ? Status On-going   ? Target Date 06/04/21   ?  ? OT LONG TERM GOAL #4  ? Title Pt will be able to reach forward 6" at moderate range to engage in IADLs with improved ease and without increased pain.   ? Baseline 5" on functional reach test   ? Time 8   ? Period Weeks   ? Status On-going   ? Target Date 06/04/21   ? ?  ?  ? ?  ? ? ? ? ? ? ? ? Plan - 04/21/21 0938   ? ? Clinical Impression Statement Pt benefited from reiteration of supine shoulder mobility and stretching to increase ROM and decrease pain during functional tasks and functional reach.  Pt responsive to education on adaptive techniques and adaptive equipment with donning and fastening shoes as well as self-feeding.  Therapist showed pt multiple options to increase independence with fastening shoes and pt trialed a variety of hand grips  and built up handles to increase success  with self-feeding while reducing tremors.   ? OT Occupational Profile and History Detailed Assessment- Review of Records and additional review of physical, cognitive, psychosocial history related to current functional performance   ? Occupational performance deficits (Please refer to evaluation for details): ADL's;IADL's;Rest and Sleep;Leisure   ? Body Structure / Function / Physical Skills ADL;Dexterity;ROM;Strength;Balance;Coordination;FMC;IADL;Body mechanics;Endurance;UE functional use;Decreased knowledge of use of DME;GMC;Mobility   ? Cognitive Skills Memory   ? Psychosocial Skills Environmental  Adaptations   ? Rehab Potential Good   ? Clinical Decision Making Several treatment options, min-mod task modification necessary   ? Comorbidities Affecting Occupational Performance: May have comorbidities impacting occupational performance   ? Modification or Assistance to Complete Evaluation  Min-Moderate modification of tasks or assist with assess necessary to complete eval   ? OT Frequency 2x / week   ? OT Duration 8 weeks   ? OT Treatment/Interventions Self-care/ADL training;Therapeutic exercise;DME and/or AE instruction;Functional Mobility Training;Cognitive remediation/compensation;Balance training;Aquatic Therapy;Neuromuscular education;Manual Therapy;Visual/perceptual remediation/compensation;Energy conservation;Therapeutic activities;Patient/family education   ? Plan inquire about self-feeding and if he has obtained elastic shoelaces; shoulder stretches within pain tolerance; continue introducing PD-specific HEP ([place], coordination activities, etc.); condition-specific education   ? OT Home Exercise Plan [id number] and [id number]   ? Consulted and Agree with Plan of Care Patient;Family member/caregiver   ? Family Member Consulted Wife [name])   ? ?  ?  ? ?  ? ? ?Patient will benefit from skilled therapeutic intervention in order to improve the following deficits and  impairments:   ?Body Structure / Function / Physical Skills: ADL, Dexterity, ROM, Strength, Balance, Coordination, FMC, IADL, Body mechanics, Endurance, UE functional use, Decreased knowledge of use of DME, [place], Mobil

## 2021-04-23 ENCOUNTER — Ambulatory Visit: Payer: Medicare Other | Admitting: Occupational Therapy

## 2021-04-23 ENCOUNTER — Encounter: Payer: Self-pay | Admitting: Occupational Therapy

## 2021-04-23 ENCOUNTER — Encounter: Payer: Self-pay | Admitting: Physical Therapy

## 2021-04-23 ENCOUNTER — Ambulatory Visit: Payer: Medicare Other | Admitting: Physical Therapy

## 2021-04-23 DIAGNOSIS — R2681 Unsteadiness on feet: Secondary | ICD-10-CM | POA: Diagnosis not present

## 2021-04-23 DIAGNOSIS — Z9181 History of falling: Secondary | ICD-10-CM | POA: Diagnosis not present

## 2021-04-23 DIAGNOSIS — R2689 Other abnormalities of gait and mobility: Secondary | ICD-10-CM

## 2021-04-23 DIAGNOSIS — R41840 Attention and concentration deficit: Secondary | ICD-10-CM | POA: Diagnosis not present

## 2021-04-23 DIAGNOSIS — M6281 Muscle weakness (generalized): Secondary | ICD-10-CM

## 2021-04-23 DIAGNOSIS — R29818 Other symptoms and signs involving the nervous system: Secondary | ICD-10-CM

## 2021-04-23 DIAGNOSIS — R251 Tremor, unspecified: Secondary | ICD-10-CM

## 2021-04-23 NOTE — Therapy (Signed)
[place] ?[facility] ?[address], [address] ?[place], [place], [address] ?Phone: [phone]   Fax:  [phone] ? ?Physical Therapy Treatment ? ?Patient Details  ?Name: Johnny Fox ?MRN: [id number] ?Date of Birth: 15-Feb-1928 ?Referring Provider (PT): Tat, Eustace Quail, DO ? ? ?Encounter Date: 04/23/2021 ? ? PT End of Session - 04/23/21 1021   ? ? Visit Number 5   ? Number of Visits 17   ? Date for PT Re-Evaluation 06/04/21   ? Authorization Type Medicare/BCBS   ? PT Start Time [phone]   ? PT Stop Time 1014   ? PT Time Calculation (min) 46 min   ? Equipment Utilized During Treatment Gait belt   ? Activity Tolerance Patient tolerated treatment well   ? Behavior During Therapy [facility] for tasks assessed/performed   ? ?  ?  ? ?  ? ? ?Past Medical History:  ?Diagnosis Date  ? Actinic keratoses   ? and seborrheic keratoses  ? Arrhythmia   ? afib postoperatively after CABG in 2004, No recurrence  ? Coronary artery disease   ? Diabetes mellitus without complication ([place])   ? diet controlled  ? Dyslipidemia   ? ED (erectile dysfunction)   ? Elevated PSA   ? 2010, workup with Dr. Risa Grill  ? H/O prostate biopsy   ? Hypertension   ? Onychomycosis   ? Prostate cancer ([place])   ? PSA's run approx 7- 2011-2013  ? Right shoulder injury   ? july 2014, Dr. Lennette Bihari Supple  ? Tick bite of abdomen   ? 2013, treated with 3wks of doxycycline, tick engorged 3.59m nodule left thyroid 4/14- seen on carotid u/s  ? ? ?Past Surgical History:  ?Procedure Laterality Date  ? CARDIAC SURGERY    ? CATARACT EXTRACTION, BILATERAL    ? CORONARY ARTERY BYPASS GRAFT  2004  ? ? ?There were no vitals filed for this visit. ? ? Subjective Assessment - 04/23/21 0927   ? ? Subjective Doing pretty good. Has some trouble standing for a long time d/t LBP.   ? Pertinent History CAD, DM, HLD, HTN, prostate CA, CABG 2004; pt reports a bad R shoulder and would like to limit shoulder movements during PT tx   ? Diagnostic tests none recent   ?  Patient Stated Goals improve balance   ? Currently in Pain? No/denies   ? ?  ?  ? ?  ? ? ? ? ? ? ? ? ? ? ? ? ? ? ? ? ? ? ? ? OPersiaAdult PT Treatment/Exercise - 04/23/21 0001   ? ?  ? Neuro Re-ed   ? Neuro Re-ed Details  standing head turns to targets 3x5- good speed, c/o imbalance; R/L D2 flexion ball to cone with L UE only d/t hx R shoulder pain 2x5 each- c/o 3-5/10 dizziness   ?  ? Lumbar Exercises: Aerobic  ? Nustep L3x5 min UEs/LEs (cues for pacing to maintain ~ 75 SPM)   ? ?  ?  ? ?  ? ? Vestibular Treatment/Exercise - 04/23/21 0001   ? ?  ? Vestibular Treatment/Exercise  ? Habituation Exercises BNestor Lewandowsky  ? Gaze Exercises X1 Viewing Horizontal;X1 Viewing Vertical   ?  ? BLaruth BouchardDaroff  ? Number of Reps  2   ? Symptom Description  with PT assist to manage LEs and adjust UE placement and to assist in increased speed of movement; c/o 0-7/10 dizziness   increased difficulty with med  mobility from R side  ?  ? X1 Viewing Horizontal  ? Foot Position sitting, standing   ? Reps --   3x10  ? Comments small ROM and difficulty coordinating movement; cues to avoid stopping at midline   ?  ? X1 Viewing Vertical  ? Foot Position sitting   ? Reps --   10  ? Comments better ability to coordinate and report of less difficulty   ? ?  ?  ? ?  ? ? ? ? ? Balance Exercises - 04/23/21 0001   ? ?  ? Balance Exercises: Standing  ? Turning Right;Left;Limitations   ? Turning Limitations 1/2 turns to targets   c/o mild dizziness and evident moderate sway  ? ?  ?  ? ?  ? ? ? ? ? PT Education - 04/23/21 1020   ? ? Education Details update to HEP- Access Code: [email or web address]; edu on vestibular system and how it can affect balance; encouraged patient to divide HEP in 1/2 and alternate days   ? Person(s) Educated Patient   ? Methods Explanation;Demonstration;Tactile cues;Verbal cues;Handout   ? Comprehension Returned demonstration;Verbalized understanding   ? ?  ?  ? ?  ? ? ? PT Short Term Goals - 04/14/21 1412   ? ?  ? PT SHORT TERM GOAL #1  ?  Title Patient to be independent with initial HEP.   ? Time 3   ? Period Weeks   ? Status On-going   ? Target Date 04/30/21   ? ?  ?  ? ?  ? ? ? ? PT Long Term Goals - 04/14/21 1412   ? ?  ? PT LONG TERM GOAL #1  ? Title Patient to be independent with advanced HEP.   ? Time 8   ? Period Weeks   ? Status On-going   ? Target Date 06/04/21   ?  ? PT LONG TERM GOAL #2  ? Title Patient to improve MiniBestTest score to atleast 17 to decrease risk of falls.   ? Time 8   ? Period Weeks   ? Status On-going   ? Target Date 06/04/21   ?  ? PT LONG TERM GOAL #3  ? Title Patient to demonstrate <10% increase between TUG and TUG cognitive to improve ability to dual task in home/community.   ? Baseline 81.3% increase on TUG cog   ? Time 8   ? Period Weeks   ? Status On-going   ? Target Date 06/04/21   ?  ? PT LONG TERM GOAL #4  ? Title Patient to demonstrate gait speed of 2.62 ft/sec in order to score as community ambulator.   ? Baseline 2.56 ft/sec   ? Time 8   ? Period Weeks   ? Status On-going   ? Target Date 06/04/21   ?  ? PT LONG TERM GOAL #5  ? Title Patient to verbalize understanding of fall prevention in home environment information.   ? Time 8   ? Period Weeks   ? Status On-going   ? Target Date 06/04/21   ? ?  ?  ? ?  ? ? ? ? ? ? ? ? Plan - 04/23/21 1021   ? ? Clinical Impression Statement Patient arrived to session with report of difficulty tolerating prolonged standing while washing dishes. Educated patient on avoiding forward flexed posture while standing and encouraged weight shifting to off-load one LE at a time. Patient reported understanding. Initiated VOR with patient demonstrating  small ROM and difficulty coordinating movements in horizontal > vertical direction. Standing head turns to targets was better-coordinated and patient able to perform at quick pace with moderate sway evident. Nestor Lewandowsky revealed c/o dizziness and with increased difficulty with bed mobility from R side. Updated HEP with standing head  turns with edu on safely performing this at home. Patient reported understanding and without complaints at end of session.   ? Comorbidities CAD, DM, HLD, HTN, prostate CA, CABG 2004   ? PT Treatment/Interventions ADLs/Self Care Home Management;Canalith Repostioning;Cryotherapy;Electrical Stimulation;DME Instruction;Moist Heat;Gait training;Stair training;Functional mobility training;Therapeutic activities;Therapeutic exercise;Balance training;Neuromuscular re-education;Manual techniques;Patient/family education;Passive range of motion;Dry needling;Energy conservation;Vestibular;Taping   ? PT Next Visit Plan forward banding activities while monitoring dizziness; bed mobility, progress calf strength, compliant surface balance, reactive balance, asses gait with cane, edu on fall prevention   ? Consulted and Agree with Plan of Care Patient   ? ?  ?  ? ?  ? ? ?Patient will benefit from skilled therapeutic intervention in order to improve the following deficits and impairments:  Abnormal gait, Decreased coordination, Difficulty walking, Impaired tone, Decreased safety awareness, Decreased endurance, Decreased activity tolerance, Pain, Decreased balance, Decreased knowledge of use of DME, Impaired flexibility, Improper body mechanics, Postural dysfunction, Decreased strength ? ?Visit Diagnosis: ?Muscle weakness (generalized) ? ?Unsteadiness on feet ? ?Other abnormalities of gait and mobility ? ? ? ? ?Problem List ?Patient Active Problem List  ? Diagnosis Date Noted  ? Aortic aneurysm ([place]) 03/26/2021  ? Abnormal gait 03/13/2020  ? Bronchitis 03/13/2020  ? Chin laceration 03/13/2020  ? Cough 03/13/2020  ? Cramp and spasm 03/13/2020  ? Disorder of penis 03/13/2020  ? Dizzy spells 03/13/2020  ? Dyspnea 03/13/2020  ? Edema of lower extremity 03/13/2020  ? Elevated PSA 03/13/2020  ? Encounter for general adult medical examination without abnormal findings 03/13/2020  ? Hearing loss in left ear 03/13/2020  ? History of  malignant neoplasm of prostate 03/13/2020  ? Impacted cerumen 03/13/2020  ? Low back pain 03/13/2020  ? Malignant tumor of prostate ([place]) 03/13/2020  ? Neuropathy 03/13/2020  ? Orthostatic hypotension 03/09/202

## 2021-04-24 NOTE — Therapy (Signed)
?OUTPATIENT OCCUPATIONAL THERAPY TREATMENT NOTE ? ? ?Patient Name: Johnny Fox ?MRN: [id number] ?DOB:July 21, 1928, 88 y.o., male ?Today's Date: 04/24/2021 ? ?PCP: Josetta Huddle, MD ?REFERRING PROVIDER: Ludwig Clarks, DO ? ? OT End of Session - 04/23/21 1020   ? ? Visit Number 5   ? Number of Visits 17   ? Date for OT Re-Evaluation 06/14/21   ? Authorization Type Medicare / BCBS supplemental   ? OT Start Time 1017   ? OT Stop Time 1103   ? OT Time Calculation (min) 46 min   ? Activity Tolerance Patient tolerated treatment well   ? Behavior During Therapy [facility] for tasks assessed/performed   ? ?  ?  ? ?  ? ?Past Medical History:  ?Diagnosis Date  ? Actinic keratoses   ? and seborrheic keratoses  ? Arrhythmia   ? afib postoperatively after CABG in 2004, No recurrence  ? Coronary artery disease   ? Diabetes mellitus without complication ([place])   ? diet controlled  ? Dyslipidemia   ? ED (erectile dysfunction)   ? Elevated PSA   ? 2010, workup with Dr. Risa Grill  ? H/O prostate biopsy   ? Hypertension   ? Onychomycosis   ? Prostate cancer ([place])   ? PSA's run approx 7- 2011-2013  ? Right shoulder injury   ? july 2014, Dr. Lennette Bihari Supple  ? Tick bite of abdomen   ? 2013, treated with 3wks of doxycycline, tick engorged 3.36m nodule left thyroid 4/14- seen on carotid u/s  ? ?Past Surgical History:  ?Procedure Laterality Date  ? CARDIAC SURGERY    ? CATARACT EXTRACTION, BILATERAL    ? CORONARY ARTERY BYPASS GRAFT  2004  ? ?Patient Active Problem List  ? Diagnosis Date Noted  ? Aortic aneurysm ([place] 03/26/2021  ? Abnormal gait 03/13/2020  ? Bronchitis 03/13/2020  ? Chin laceration 03/13/2020  ? Cough 03/13/2020  ? Cramp and spasm 03/13/2020  ? Disorder of penis 03/13/2020  ? Dizzy spells 03/13/2020  ? Dyspnea 03/13/2020  ? Edema of lower extremity 03/13/2020  ? Elevated PSA 03/13/2020  ? Encounter for general adult medical examination without abnormal findings 03/13/2020  ? Hearing loss in left ear 03/13/2020  ? History of  malignant neoplasm of prostate 03/13/2020  ? Impacted cerumen 03/13/2020  ? Low back pain 03/13/2020  ? Malignant tumor of prostate ([place] 03/13/2020  ? Neuropathy 03/13/2020  ? Orthostatic hypotension 03/13/2020  ? Other long term (current) drug therapy 03/13/2020  ? Other specified abnormal findings of blood chemistry 03/13/2020  ? Other sprain of right hip, initial encounter 03/13/2020  ? Paresthesia 03/13/2020  ? Parkinson's disease ([place] 03/13/2020  ? Paroxysmal atrial fibrillation ([place] 03/13/2020  ? Polyneuropathy due to type 2 diabetes mellitus ([place] 03/13/2020  ? Presence of aortocoronary bypass graft 03/13/2020  ? Sciatica, right side 03/13/2020  ? Shoulder joint pain 03/13/2020  ? Skin sensation disturbance 03/13/2020  ? Type 2 diabetes mellitus without complications ([place] [id number] ? Upper respiratory infection 03/13/2020  ? Vitamin D deficiency 03/13/2020  ? Asymmetrical left sensorineural hearing loss 01/13/2017  ? Right leg numbness 01/14/2015  ? Thyroid nodule 01/14/2015  ? Coronary atherosclerosis of native coronary artery 02/15/2013  ? Essential hypertension, benign 02/15/2013  ? Pure hypercholesterolemia 02/15/2013  ? Fatigue 02/15/2013  ? ? ?ONSET DATE: [place] date of dx, May 2021 ? ?REFERRING DIAG: Parkinson's Disease ? ?THERAPY DIAG:  ?Tremor ? ?Muscle weakness (generalized) ? ?Attention and concentration deficit ? ?History of falling ? ?  Other symptoms and signs involving the nervous system ? ? ?PERTINENT HISTORY: ?ET/PD diagnosed approx May 2021; PMH includes repeated falls, CAD, HTN, afib s/p CABG in 2004, hx of prostate cancer, DMT2, and chronic R shoulder pain ? ?PRECAUTIONS: Fall; hearing loss ? ?SUBJECTIVE: ?"I feel like this program is really helping" ? ?PAIN:  ?Are you having pain? No ? ? ?OBJECTIVE:  ? ?TODAY'S TREATMENT:  ?OT problem-solved w/ pt to identify potential compensatory/adaptive strategies specifically pertaining to dishwashing, including taking rest breaks to sit,  modifying order of tasks to decrease repetitive bending/twisting and adjusting positioning, as well as shaving, including options for positioning, body mechanics (see 'Education' below) and potential benefit of incorporating shaving into shower routine; pt verbalized understanding ?Large amplitude movements, completing sets of both alternating reaching toward contralateral UE w/ arms abducted to shoulder height and contralateral punches to both sides w/ trunk rotation; increased success/decreased discomfort w/ alternating punches w/ handout administered for pt to include exercises into HEP while seated. Completed 2x5 slowly of each set w/ rest breaks prn and OT providing demonstration/modeling, verbal cues to maintain large amp movement, and repetition for success ?Simulated self-feeding using spoon w/ built-up handle to assess participation and positioning during activity; OT problem-solved w/ pt to identify limitations reported w/ use of administered built-up foam handle in home setting ? ? ?PATIENT EDUCATION: ?Education details: Continued condition-specific education, particularly regarding ways to prevent future complications including use of large base of support for better balance and associated trunk movements with UE movement to decrease risk of pain/injury  ?Person educated: Patient and Spouse ?Education method: Explanation ?Education comprehension: verbalized understanding ? ? ?HOME EXERCISE PROGRAM ?MedBridge Code: [id number] and [id number] ? ? OT Short Term Goals              Target Date: 05/07/21  ? ? STG #1  ? Title Pt will demonstrate independence w/ condition-specific HEP   ? Baseline No HEP at this time   ? Status On-going  ?  ? STG #2  ? Title Pt will verbalize understanding of strategies to decrease risk of further complication related to PD, including corresponding energy conservation strategies to improve participation in IADLs   ? Baseline Decreased knowledge of condition-specific education   ?  Status On-going  ?  ? STG #3  ? Title Pt will demonstrate independence w/ recommended AE to improve independence w/ self-care/grooming tasks (long handled sponge, reacher, adaptive comb, etc)   ? Baseline Decreased knowledge of AE   ? Status On-going  ? ?  ?  ? ? OT Long Term Goals               Target Date: 06/04/21  ? ? LTG #1  ? Title Pt will improve participation in self-feeding as evidenced by decreasing time to move 5 beans from a bowl w/ a spoon, incorporating compensatory strategies/AE prn   ? Baseline 15.62 sec   ? Status On-going   ?  ? LTG #2  ? Title Pt will demonstrate increased ease w/ manipulation of clothing fasteners by decreasing time to complete 4 buttons by at least 5 sec   ? Baseline 44.56 sec to button 4 shirt buttons   ? Status On-going  ?  ? LTG #3  ? Title Pt will demonstrate independence w/ at least 1 memory compensatory strategy by d/c   ? Baseline Reports difficulty w/ short-term memory (e.g., names, conversations)   ? Status On-going  ?  ? LTG #4  ? Title Pt  will be able to reach forward 6" at moderate range to engage in IADLs with improved ease and without increased pain.   ? Baseline 5" on functional reach test   ? Status On-going  ?  ? ? Plan - 04/23/21 1022   ? ? Clinical Impression Statement Pt continues to be very motivated in therapy and reports compliance w/ exercises at home. Due to pt's report of some difficulty using foam built-up handle administered in prior session, OT reviewed this again today; pt demonstrated increased difficulty manipulating spoon w/ a taller bowl and reports he needs to use a smaller size spoon to fit into the foam handle. OT discussed additional options/modifications and will reassess next week. OT also reviewed current HEP w/ pt who demonstrates understanding and continued w/ condition-specific education this session and worked w/ pt to relate learned strategies to pt-specific activities (i.e., washing dishes, shaving).  ? OT Occupational Profile and  History Detailed Assessment- Review of Records and additional review of physical, cognitive, psychosocial history related to current functional performance   ? Occupational performance deficits (Please refer to evaluation

## 2021-04-25 DIAGNOSIS — I2510 Atherosclerotic heart disease of native coronary artery without angina pectoris: Secondary | ICD-10-CM | POA: Diagnosis not present

## 2021-04-25 DIAGNOSIS — E7800 Pure hypercholesterolemia, unspecified: Secondary | ICD-10-CM | POA: Diagnosis not present

## 2021-04-25 DIAGNOSIS — I480 Paroxysmal atrial fibrillation: Secondary | ICD-10-CM | POA: Diagnosis not present

## 2021-04-25 DIAGNOSIS — G20 Parkinson's disease: Secondary | ICD-10-CM | POA: Diagnosis not present

## 2021-04-25 DIAGNOSIS — E1142 Type 2 diabetes mellitus with diabetic polyneuropathy: Secondary | ICD-10-CM | POA: Diagnosis not present

## 2021-04-25 DIAGNOSIS — I10 Essential (primary) hypertension: Secondary | ICD-10-CM | POA: Diagnosis not present

## 2021-04-28 ENCOUNTER — Encounter: Payer: Self-pay | Admitting: Physical Therapy

## 2021-04-28 ENCOUNTER — Encounter: Payer: Self-pay | Admitting: Occupational Therapy

## 2021-04-28 ENCOUNTER — Ambulatory Visit: Payer: Medicare Other | Admitting: Occupational Therapy

## 2021-04-28 ENCOUNTER — Ambulatory Visit: Payer: Medicare Other | Admitting: Physical Therapy

## 2021-04-28 DIAGNOSIS — R251 Tremor, unspecified: Secondary | ICD-10-CM

## 2021-04-28 DIAGNOSIS — R2681 Unsteadiness on feet: Secondary | ICD-10-CM | POA: Diagnosis not present

## 2021-04-28 DIAGNOSIS — R2689 Other abnormalities of gait and mobility: Secondary | ICD-10-CM

## 2021-04-28 DIAGNOSIS — R29818 Other symptoms and signs involving the nervous system: Secondary | ICD-10-CM

## 2021-04-28 DIAGNOSIS — Z9181 History of falling: Secondary | ICD-10-CM | POA: Diagnosis not present

## 2021-04-28 DIAGNOSIS — R278 Other lack of coordination: Secondary | ICD-10-CM

## 2021-04-28 DIAGNOSIS — R41840 Attention and concentration deficit: Secondary | ICD-10-CM | POA: Diagnosis not present

## 2021-04-28 DIAGNOSIS — M6281 Muscle weakness (generalized): Secondary | ICD-10-CM

## 2021-04-28 NOTE — Therapy (Signed)
[place] ?[facility] ?[address], [address] ?[place], [place], [address] ?Phone: [phone]   Fax:  [phone] ? ?Occupational Therapy Treatment ? ?Patient Details  ?Name: Johnny Fox ?MRN: [id number] ?Date of Birth: 1928-05-12 ?Referring Provider (OT): Alonza Bogus, DO ? ? ?Encounter Date: 04/28/2021 ? ? OT End of Session - 04/28/21 1013   ? ? Visit Number 6   ? Number of Visits 17   ? Date for OT Re-Evaluation 06/14/21   ? Authorization Type Medicare / BCBS supplemental   ? OT Start Time 1012   ? OT Stop Time 1056   ? OT Time Calculation (min) 44 min   ? Activity Tolerance Patient tolerated treatment well   ? Behavior During Therapy [facility] for tasks assessed/performed   ? ?  ?  ? ?  ? ? ?Past Medical History:  ?Diagnosis Date  ? Actinic keratoses   ? and seborrheic keratoses  ? Arrhythmia   ? afib postoperatively after CABG in 2004, No recurrence  ? Coronary artery disease   ? Diabetes mellitus without complication ([place])   ? diet controlled  ? Dyslipidemia   ? ED (erectile dysfunction)   ? Elevated PSA   ? 2010, workup with Dr. Risa Grill  ? H/O prostate biopsy   ? Hypertension   ? Onychomycosis   ? Prostate cancer ([place])   ? PSA's run approx 7- 2011-2013  ? Right shoulder injury   ? july 2014, Dr. Lennette Bihari Supple  ? Tick bite of abdomen   ? 2013, treated with 3wks of doxycycline, tick engorged 3.42mm nodule left thyroid 4/14- seen on carotid u/s  ? ? ?Past Surgical History:  ?Procedure Laterality Date  ? CARDIAC SURGERY    ? CATARACT EXTRACTION, BILATERAL    ? CORONARY ARTERY BYPASS GRAFT  2004  ? ? ?There were no vitals filed for this visit. ? ? Subjective Assessment - 04/28/21 1013   ? ? Subjective  "I met with a support group from church last night"   ? Patient is accompanied by: Family member   Wife [name])  ? Pertinent History ET/PD diagnosed approx May 2021; PMH includes repeated falls, CAD, HTN, afib s/p CABG in 2004, hx of prostate cancer, DMT2, and chronic R shoulder pain   ?  Limitations Hearing loss   ? Patient Stated Goals Get as much out of therapy as possible   ? Currently in Pain? No/denies   ? ?  ?  ? ?  ? ? ?ADL: Discussed strategies recommended during previous OT session with washing dishes.  Pt reports that he thought about the suggestions, but he didn't use the strategies as he prefers his technique.  We further discussed it and discussed completing tasks in short increments to allow for rest breaks/not overwork.  Discussed elastic laces - has not found any yet.   ? ?Self-feeding: Provided smaller width foam for handle and lower rimmed bowl as pt reports difficulty with the angle of the original foam handle and higher edged bowls.  Pt demonstrating still small tremors, but reports improvements with decreased hitting on side and overall noted improvements. ? ?Handwriting: engaged in writing with use of lined paper and education on printing vs cursive for improved starts and stops with printing and focus on larger sized letters.  Pt demonstrating improved legibility with lined paper.  Educated on practice on lined paper with carry over to unlined if writing cards or signing in at appointments.   ? ?Large amplitude movement:  Engaged in mid range reaching with LUE and RUE with focus on full hand grasp on cups and large amplitude reach and release.  Therapist providing cues for full hand contact on cup and full open hand during release.  Pt reports R shoulder beginning to ache with over head reaching.  Discussed incorporation of RUE when able, but also discussed not overusing RUE due to pain. ? ? ? ? ? ? ? ? ? ? ? ? ? ? ? ? ? ? ? ? ? ? ? ? ? ? ? OT Short Term Goals - 04/14/21 1024   ? ?  ? OT SHORT TERM GOAL #1  ? Title Pt will demonstrate independence w/ condition-specific HEP   ? Baseline No HEP at this time   ? Time 4   ? Period Weeks   ? Status On-going   ? Target Date 05/07/21   ?  ? OT SHORT TERM GOAL #2  ? Title Pt will verbalize understanding of strategies to decrease risk  of further complication related to PD, including corresponding energy conservation strategies to improve participation in IADLs   ? Baseline Decreased knowledge of condition-specific education   ? Time 4   ? Period Weeks   ? Status On-going   ? Target Date 05/07/21   ?  ? OT SHORT TERM GOAL #3  ? Title Pt will demonstrate independence w/ recommended AE to improve independence w/ self-care/grooming tasks (long handled sponge, reacher, adaptive comb, etc)   ? Baseline Decreased knowledge of AE   ? Time 4   ? Period Weeks   ? Status On-going   ? Target Date 05/07/21   ? ?  ?  ? ?  ? ? ? ? OT Long Term Goals - 04/14/21 1025   ? ?  ? OT LONG TERM GOAL #1  ? Title Pt will improve participation in self-feeding as evidenced by decreasing time to move 5 beans from a bowl w/ a spoon, incorporating compensatory strategies/AE prn   ? Baseline 15.62 sec   ? Time 8   ? Period Weeks   ? Status On-going   ? Target Date 06/04/21   ?  ? OT LONG TERM GOAL #2  ? Title Pt will demonstrate increased ease w/ manipulation of clothing fasteners by decreasing time to complete 4 buttons by at least 5 sec   ? Baseline 44.56 sec to button 4 shirt buttons   ? Time 8   ? Period Weeks   ? Status On-going   ? Target Date 06/04/21   ?  ? OT LONG TERM GOAL #3  ? Title Pt will demonstrate independence w/ at least 1 memory compensatory strategy by d/c   ? Baseline Reports difficulty w/ short-term memory (e.g., names, conversations)   ? Time 8   ? Period Weeks   ? Status On-going   ? Target Date 06/04/21   ?  ? OT LONG TERM GOAL #4  ? Title Pt will be able to reach forward 6" at moderate range to engage in IADLs with improved ease and without increased pain.   ? Baseline 5" on functional reach test   ? Time 8   ? Period Weeks   ? Status On-going   ? Target Date 06/04/21   ? ?  ?  ? ?  ? ? ? ? ? ? ? ? Plan - 04/28/21 1013   ? ? Clinical Impression Statement Pt demonstrating improved ease with self-feeding with modifications to built  up handle (smaller  circumference foam) and changing to bowl with lower rimmed bowl.  Pt demonstrating improved size and legibility of handwriting with lined paper.  Discussed importance of practice on lined paper and with focus on larger sized font to carry over to handwriting in cards.  Pt continues to demonstrate pain in R shoulder limiting overhead reach or repetitive movements.   ? OT Occupational Profile and History Detailed Assessment- Review of Records and additional review of physical, cognitive, psychosocial history related to current functional performance   ? Occupational performance deficits (Please refer to evaluation for details): ADL's;IADL's;Rest and Sleep;Leisure   ? Body Structure / Function / Physical Skills ADL;Dexterity;ROM;Strength;Balance;Coordination;FMC;IADL;Body mechanics;Endurance;UE functional use;Decreased knowledge of use of DME;GMC;Mobility   ? Cognitive Skills Memory   ? Psychosocial Skills Environmental  Adaptations   ? Rehab Potential Good   ? Clinical Decision Making Several treatment options, min-mod task modification necessary   ? Comorbidities Affecting Occupational Performance: May have comorbidities impacting occupational performance   ? Modification or Assistance to Complete Evaluation  Min-Moderate modification of tasks or assist with assess necessary to complete eval   ? OT Frequency 2x / week   ? OT Duration 8 weeks   ? OT Treatment/Interventions Self-care/ADL training;Therapeutic exercise;DME and/or AE instruction;Functional Mobility Training;Cognitive remediation/compensation;Balance training;Aquatic Therapy;Neuromuscular education;Manual Therapy;Visual/perceptual remediation/compensation;Energy conservation;Therapeutic activities;Patient/family education   ? Plan Trial activity with small items as pt reports increased dropping of items.  inquire about self-feeding and if he has obtained elastic shoelaces; shoulder stretches within pain tolerance; continue introducing PD-specific HEP ([place],  coordination activities, etc.); condition-specific education   ? OT Home Exercise Plan [email or web address] and [id number]   ? Consulted and Agree with Plan of Care Patient;Family member/caregiver   ? Family Member C

## 2021-04-28 NOTE — Therapy (Signed)
[facility] ?[address], [address] ?[place], [place], [address] ?Phone: [phone]   Fax:  [phone] ? ?Physical Therapy Treatment ? ?Patient Details  ?Name: Johnny Fox ?MRN: [id number] ?Date of Birth: August 12, 1928 ?Referring Provider (PT): Tat, Eustace Quail, DO ? ? ?Encounter Date: 04/28/2021 ? ? PT End of Session - 04/28/21 1008   ? ? Visit Number 6   ? Number of Visits 17   ? Date for PT Re-Evaluation 06/04/21   ? Authorization Type Medicare/BCBS   ? PT Start Time 0930   ? PT Stop Time 1010   ? PT Time Calculation (min) 40 min   ? Equipment Utilized During Treatment Gait belt   ? Activity Tolerance Patient tolerated treatment well   ? Behavior During Therapy [facility] for tasks assessed/performed   ? ?  ?  ? ?  ? ? ?Past Medical History:  ?Diagnosis Date  ? Actinic keratoses   ? and seborrheic keratoses  ? Arrhythmia   ? afib postoperatively after CABG in 2004, No recurrence  ? Coronary artery disease   ? Diabetes mellitus without complication ([place])   ? diet controlled  ? Dyslipidemia   ? ED (erectile dysfunction)   ? Elevated PSA   ? 2010, workup with Dr. Risa Grill  ? H/O prostate biopsy   ? Hypertension   ? Onychomycosis   ? Prostate cancer ([place])   ? PSA's run approx 7- 2011-2013  ? Right shoulder injury   ? july 2014, Dr. Lennette Bihari Supple  ? Tick bite of abdomen   ? 2013, treated with 3wks of doxycycline, tick engorged 3.17m nodule left thyroid 4/14- seen on carotid u/s  ? ? ?Past Surgical History:  ?Procedure Laterality Date  ? CARDIAC SURGERY    ? CATARACT EXTRACTION, BILATERAL    ? CORONARY ARTERY BYPASS GRAFT  2004  ? ? ?There were no vitals filed for this visit. ? ? Subjective Assessment - 04/28/21 0932   ? ? Subjective No new issues since last session.   ? Pertinent History CAD, DM, HLD, HTN, prostate CA, CABG 2004; pt reports a bad R shoulder and would like to limit shoulder movements during PT tx   ? Diagnostic tests none recent   ? Patient Stated Goals improve balance    ? Currently in Pain? No/denies   ? ?  ?  ? ?  ? ? ? ? ? ? ? ? ? ? ? ? ? ? ? ? ? ? ? ? [place] Treatment/Exercise - 04/28/21 0001   ? ?  ? Neuro Re-ed   ? Neuro Re-ed Details  standing head turns to targets 2x10 with mild ant/pos sway and c/o mild dizziness; standing head turns with EC 2x10- c/o more intense dizziness; with mirror feedback: ant/pos weight shifts, lateral wt shifts- patient trouble with coordinating movement, thus needed to break down activity with improved carryover   ?  ? Lumbar Exercises: Aerobic  ? Nustep L5x 1.5 min, L3x3.5 min UEs/LEs (cues for pacing to maintain ~ 75 SPM to avoid fatigue)   ? ?  ?  ? ?  ? ? Vestibular Treatment/Exercise - 04/28/21 0001   ? ?  ? BLaruth BouchardDaroff  ? Number of Reps  4   ? Symptom Description  with PT assist to manage LEs, adjust UE placement,  and to assist in increased speed of movement; c/o 6-8/10 dizziness   ? ?  ?  ? ?  ? ? ? ? ? ? ? ? ? ? ?  PT Short Term Goals - 04/28/21 1252   ? ?  ? PT SHORT TERM GOAL #1  ? Title Patient to be independent with initial HEP.   ? Time 3   ? Period Weeks   ? Status Achieved   ? Target Date 04/30/21   ? ?  ?  ? ?  ? ? ? ? PT Long Term Goals - 04/14/21 1412   ? ?  ? PT LONG TERM GOAL #1  ? Title Patient to be independent with advanced HEP.   ? Time 8   ? Period Weeks   ? Status On-going   ? Target Date 06/04/21   ?  ? PT LONG TERM GOAL #2  ? Title Patient to improve MiniBestTest score to atleast 17 to decrease risk of falls.   ? Time 8   ? Period Weeks   ? Status On-going   ? Target Date 06/04/21   ?  ? PT LONG TERM GOAL #3  ? Title Patient to demonstrate <10% increase between TUG and TUG cognitive to improve ability to dual task in home/community.   ? Baseline 81.3% increase on TUG cog   ? Time 8   ? Period Weeks   ? Status On-going   ? Target Date 06/04/21   ?  ? PT LONG TERM GOAL #4  ? Title Patient to demonstrate gait speed of 2.62 ft/sec in order to score as community ambulator.   ? Baseline 2.56 ft/sec   ? Time 8   ?  Period Weeks   ? Status On-going   ? Target Date 06/04/21   ?  ? PT LONG TERM GOAL #5  ? Title Patient to verbalize understanding of fall prevention in home environment information.   ? Time 8   ? Period Weeks   ? Status On-going   ? Target Date 06/04/21   ? ?  ?  ? ?  ? ? ? ? ? ? ? ? Plan - 04/28/21 1008   ? ? Clinical Impression Statement Patient arrived to session without new complaints. Continued working on standing head turns with EO and EC for balance and vestibular challenge. Patient reported more dizziness with EC, but able to tolerate. Also continued working on habituation to address motion sensitivity and remaining difficulties with bed mobility. Patient reported moderate-severe dizziness but able to requires less PT assist with transfer wit subsequent reps after verbal cueing for hand/LE placement. Worked on standing weight shifts with Pharmacist, hospital. Patient initially with trouble coordinating these LEs, but with improved carryover after breaking the movement into parts. Patient tolerated session well and without complaints upon leaving.   ? Comorbidities CAD, DM, HLD, HTN, prostate CA, CABG 2004   ? PT Treatment/Interventions ADLs/Self Care Home Management;Canalith Repostioning;Cryotherapy;Electrical Stimulation;DME Instruction;Moist Heat;Gait training;Stair training;Functional mobility training;Therapeutic activities;Therapeutic exercise;Balance training;Neuromuscular re-education;Manual techniques;Patient/family education;Passive range of motion;Dry needling;Energy conservation;Vestibular;Taping   ? PT Next Visit Plan forward banding activities while monitoring dizziness; bed mobility, progress calf strength, compliant surface balance, reactive balance, asses gait with cane, edu on fall prevention   ? Consulted and Agree with Plan of Care Patient   ? ?  ?  ? ?  ? ? ?Patient will benefit from skilled therapeutic intervention in order to improve the following deficits and impairments:  Abnormal gait,  Decreased coordination, Difficulty walking, Impaired tone, Decreased safety awareness, Decreased endurance, Decreased activity tolerance, Pain, Decreased balance, Decreased knowledge of use of DME, Impaired flexibility, Improper body mechanics, Postural dysfunction, Decreased strength ? ?  Visit Diagnosis: ?Muscle weakness (generalized) ? ?Unsteadiness on feet ? ?Other abnormalities of gait and mobility ? ? ? ? ?Problem List ?Patient Active Problem List  ? Diagnosis Date Noted  ? Aortic aneurysm ([place]) 03/26/2021  ? Abnormal gait 03/13/2020  ? Bronchitis 03/13/2020  ? Chin laceration 03/13/2020  ? Cough 03/13/2020  ? Cramp and spasm 03/13/2020  ? Disorder of penis 03/13/2020  ? Dizzy spells 03/13/2020  ? Dyspnea 03/13/2020  ? Edema of lower extremity 03/13/2020  ? Elevated PSA 03/13/2020  ? Encounter for general adult medical examination without abnormal findings 03/13/2020  ? Hearing loss in left ear 03/13/2020  ? History of malignant neoplasm of prostate 03/13/2020  ? Impacted cerumen 03/13/2020  ? Low back pain 03/13/2020  ? Malignant tumor of prostate ([place]) 03/13/2020  ? Neuropathy 03/13/2020  ? Orthostatic hypotension 03/13/2020  ? Other long term (current) drug therapy 03/13/2020  ? Other specified abnormal findings of blood chemistry 03/13/2020  ? Other sprain of right hip, initial encounter 03/13/2020  ? Paresthesia 03/13/2020  ? Parkinson's disease ([place]) 03/13/2020  ? Paroxysmal atrial fibrillation ([place]) 03/13/2020  ? Polyneuropathy due to type 2 diabetes mellitus ([place]) 03/13/2020  ? Presence of aortocoronary bypass graft 03/13/2020  ? Sciatica, right side 03/13/2020  ? Shoulder joint pain 03/13/2020  ? Skin sensation disturbance 03/13/2020  ? Type 2 diabetes mellitus without complications ([place]) 76/28/3151  ? Upper respiratory infection 03/13/2020  ? Vitamin D deficiency 03/13/2020  ? Asymmetrical left sensorineural hearing loss 01/13/2017  ? Right leg numbness 01/14/2015  ? Thyroid nodule 01/14/2015  ?  Coronary atherosclerosis of native coronary artery 02/15/2013  ? Essential hypertension, benign 02/15/2013  ? Pure hypercholesterolemia 02/15/2013  ? Fatigue 02/15/2013  ? ? ?Janene Harvey, PT, DPT ?04/2

## 2021-04-30 ENCOUNTER — Encounter: Payer: Self-pay | Admitting: Occupational Therapy

## 2021-04-30 ENCOUNTER — Ambulatory Visit: Payer: Medicare Other | Admitting: Physical Therapy

## 2021-04-30 ENCOUNTER — Encounter: Payer: Self-pay | Admitting: Physical Therapy

## 2021-04-30 ENCOUNTER — Ambulatory Visit: Payer: Medicare Other | Admitting: Occupational Therapy

## 2021-04-30 DIAGNOSIS — M6281 Muscle weakness (generalized): Secondary | ICD-10-CM

## 2021-04-30 DIAGNOSIS — Z9181 History of falling: Secondary | ICD-10-CM

## 2021-04-30 DIAGNOSIS — R41840 Attention and concentration deficit: Secondary | ICD-10-CM | POA: Diagnosis not present

## 2021-04-30 DIAGNOSIS — R2689 Other abnormalities of gait and mobility: Secondary | ICD-10-CM

## 2021-04-30 DIAGNOSIS — R29818 Other symptoms and signs involving the nervous system: Secondary | ICD-10-CM

## 2021-04-30 DIAGNOSIS — R251 Tremor, unspecified: Secondary | ICD-10-CM | POA: Diagnosis not present

## 2021-04-30 DIAGNOSIS — R2681 Unsteadiness on feet: Secondary | ICD-10-CM

## 2021-04-30 NOTE — Patient Instructions (Signed)
Active Hook Fist ? ? ?With fingers and knuckles straight, bend middle and tip joints. Do not bend large knuckles. ?Repeat 5- 0 times. Do 1x/day ? ? ?Finger Walk ? ? ? ?With palm flat on table and held steady, lift or slide fingers one by one toward thumb. Then walk fingers back one at a time the other way.  Push through palm.  5-10x, 1x/day ? ? ?MP Extension (Active) ? ? ? ?With palm on table, straighten fingers completely at large knuckles one at a time, and lift fingers off table. 5-10x, 1x/day  ?

## 2021-04-30 NOTE — Therapy (Signed)
?OUTPATIENT OCCUPATIONAL THERAPY TREATMENT NOTE ? ? ?Patient Name: Johnny Fox ?MRN: [id number] ?DOB:1928-10-01, 88 y.o., male ?Today's Date: 04/30/2021 ? ?PCP: Josetta Huddle, MD ?REFERRING PROVIDER: Ludwig Clarks, DO ? ? OT End of Session - 04/30/21 1019   ? ? Visit Number 7   ? Number of Visits 17   ? Date for OT Re-Evaluation 06/14/21   ? Authorization Type Medicare / BCBS supplemental   ? OT Start Time 5638   ? OT Stop Time 1100   ? OT Time Calculation (min) 46 min   ? Activity Tolerance Patient tolerated treatment well   ? Behavior During Therapy [facility] for tasks assessed/performed   ? ?  ?  ? ?  ? ?Past Medical History:  ?Diagnosis Date  ? Actinic keratoses   ? and seborrheic keratoses  ? Arrhythmia   ? afib postoperatively after CABG in 2004, No recurrence  ? Coronary artery disease   ? Diabetes mellitus without complication ([place])   ? diet controlled  ? Dyslipidemia   ? ED (erectile dysfunction)   ? Elevated PSA   ? 2010, workup with Dr. Risa Grill  ? H/O prostate biopsy   ? Hypertension   ? Onychomycosis   ? Prostate cancer ([place])   ? PSA's run approx 7- 2011-2013  ? Right shoulder injury   ? july 2014, Dr. Lennette Bihari Supple  ? Tick bite of abdomen   ? 2013, treated with 3wks of doxycycline, tick engorged 3.23m nodule left thyroid 4/14- seen on carotid u/s  ? ?Past Surgical History:  ?Procedure Laterality Date  ? CARDIAC SURGERY    ? CATARACT EXTRACTION, BILATERAL    ? CORONARY ARTERY BYPASS GRAFT  2004  ? ?Patient Active Problem List  ? Diagnosis Date Noted  ? Aortic aneurysm ([place] 03/26/2021  ? Abnormal gait 03/13/2020  ? Bronchitis 03/13/2020  ? Chin laceration 03/13/2020  ? Cough 03/13/2020  ? Cramp and spasm 03/13/2020  ? Disorder of penis 03/13/2020  ? Dizzy spells 03/13/2020  ? Dyspnea 03/13/2020  ? Edema of lower extremity 03/13/2020  ? Elevated PSA 03/13/2020  ? Encounter for general adult medical examination without abnormal findings 03/13/2020  ? Hearing loss in left ear 03/13/2020  ? History of  malignant neoplasm of prostate 03/13/2020  ? Impacted cerumen 03/13/2020  ? Low back pain 03/13/2020  ? Malignant tumor of [place] 03/13/2020  ? Neuropathy 03/13/2020  ? Orthostatic hypotension 03/13/2020  ? Other long term (current) drug therapy 03/13/2020  ? Other specified abnormal findings of blood chemistry 03/13/2020  ? Other sprain of right hip, initial encounter 03/13/2020  ? Paresthesia 03/13/2020  ? Parkinson's disease ([place] 03/13/2020  ? Paroxysmal atrial fibrillation ([place] 03/13/2020  ? Polyneuropathy due to type 2 diabetes mellitus ([place] 03/13/2020  ? Presence of aortocoronary bypass graft 03/13/2020  ? Sciatica, right side 03/13/2020  ? Shoulder joint pain 03/13/2020  ? Skin sensation disturbance 03/13/2020  ? Type 2 diabetes mellitus without complications ([place] 075/64/3329 ? Upper respiratory infection 03/13/2020  ? Vitamin D deficiency 03/13/2020  ? Asymmetrical left sensorineural hearing loss 01/13/2017  ? Right leg numbness 01/14/2015  ? Thyroid nodule 01/14/2015  ? Coronary atherosclerosis of native coronary artery 02/15/2013  ? Essential hypertension, benign 02/15/2013  ? Pure hypercholesterolemia 02/15/2013  ? Fatigue 02/15/2013  ? ? ?ONSET DATE: [place] date of dx, May 2021 ? ?REFERRING DIAG: Parkinson's Disease ? ?THERAPY DIAG:  ?Tremor ? ?Muscle weakness (generalized) ? ?Other symptoms and signs involving the nervous system ? ?  Attention and concentration deficit ? ?History of falling ? ? ?PERTINENT HISTORY: ?ET/PD diagnosed approx May 2021; PMH includes repeated falls, CAD, HTN, afib s/p CABG in 2004, hx of prostate cancer, DMT2, and chronic R shoulder pain ? ?PRECAUTIONS: Fall; hearing loss ? ?SUBJECTIVE: ?Pt states he is tired today because he took his boxing class yesterday and then also showered and shaved this morning ? ?PAIN:  ?Are you having pain? No ? ? ?OBJECTIVE:  ? ?TODAY'S TREATMENT - 04/30/21: ?Hand exercises: finger walks, abd/adducting fingers one at a time toward thumb and  back toward ulnar side; isolated finger MCP extension; and hook fist (PIP/DIP flexion only) completed x10 each. Pt completed finger walks w/ each hand individually w/ OT providing demonstration/modeling, verbal cues to press palms into tabletop and for large deliberate movements w/ all exercises. OT also incorporated blocking w/ hook fist exercise and additional verbal cues to prevent additional flexion of MCPJs ?Pegboard activity, copying pattern onto small board w/ mini pegs to facilitate NMR of L hand coordination and dexterity w/ pt additionally opening hand wide and deliberately before retrieving pegs. OT graded task up when removing pegs by having pt pick up 4 pegs one at a time and translating from palm-to-fingertips to return to container. Pt able to complete activity w/out cues/facilitation for spatial orientation to copy pattern and demonstrated min drops only w/ in-hand manipulation task. OT provided add'l cues and relevant education regarding large amplitude movement and applicability to functional tasks ?Donning shoes/tying shoelaces attempted w/ shoelaces tried prior to donning shoe and incorporating shoe-horn; pt unable to complete task successfully in 3 trials w/ Min A and activity was d/c ? ?PREVIOUS TREATMENTS: ?04/28/21 ?ADLs: reviewed recommendations for dish washing; energy conservation; elastic shoelaces ?Self-feeding: practiced w/ smaller foam handles ?Handwriting ?Large amp movement: mid-range reaching to grasp/release cups ? ?04/23/21 ?Compensatory/adaptive strategies for dishwashing, shaving; also discussed body mechanics and energy conservation ?Large amp movement: contralateral reaching/punching w/ trunk rotation ?Self-feeding w/ AE ? ?PATIENT EDUCATION: ?Education details: Continued condition-specific education; updated HEP (see pt instructions) ?Person educated: Patient ?Education method: Explanation, Demonstration, Verbal cues, and Handouts ?Education comprehension: verbalized  understanding and returned demonstration ? ? ?[place] Code: [id number] and [id number] ? ? OT Short Term Goals              Target Date: 05/07/21  ? ? STG #1  ? Title Pt will demonstrate independence w/ condition-specific HEP   ? Baseline No HEP at this time   ? Status On-going  ?  ? STG #2  ? Title Pt will verbalize understanding of strategies to decrease risk of further complication related to PD, including corresponding energy conservation strategies to improve participation in IADLs   ? Baseline Decreased knowledge of condition-specific education   ? Status On-going  ?  ? STG #3  ? Title Pt will demonstrate independence w/ recommended AE to improve independence w/ self-care/grooming tasks (long handled sponge, reacher, adaptive comb, etc)   ? Baseline Decreased knowledge of AE   ? Status On-going  ? ?  ?  ? ? OT Long Term Goals               Target Date: 06/04/21  ? ? LTG #1  ? Title Pt will improve participation in self-feeding as evidenced by decreasing time to move 5 beans from a bowl w/ a spoon, incorporating compensatory strategies/AE prn   ? Baseline 15.62 sec   ? Status On-going   ?  ? LTG #2  ?  Title Pt will demonstrate increased ease w/ manipulation of clothing fasteners by decreasing time to complete 4 buttons by at least 5 sec   ? Baseline 44.56 sec to button 4 shirt buttons   ? Status On-going  ?  ? LTG #3  ? Title Pt will demonstrate independence w/ at least 1 memory compensatory strategy by d/c   ? Baseline Reports difficulty w/ short-term memory (e.g., names, conversations)   ? Status On-going  ?  ? LTG #4  ? Title Pt will be able to reach forward 6" at moderate range to engage in IADLs with improved ease and without increased pain.   ? Baseline 5" on functional reach test   ? Status On-going  ?  ? ? Plan - 04/23/21 1022   ? ? Clinical Impression Statement Pt continues to progress well toward goals and verbalizes understanding of energy conservation strategies w/ OT encouraging  him to incorporate these into his activities at home. OT then addressed isolated finger exercises, emphasizing bigger and more deliberate movements w/ each rep, to improve GMC and coordination of BUEs considering pt's

## 2021-04-30 NOTE — Therapy (Signed)
[facility] ?[address], [address] ?[place], [place], [address] ?Phone: [phone]   Fax:  [phone] ? ?Physical Therapy Treatment ? ?Patient Details  ?Name: Johnny Fox ?MRN: [id number] ?Date of Birth: [birth year] ?Referring Provider (PT): Tat, Eustace Quail, DO ? ? ?Encounter Date: 04/30/2021 ? ? PT End of Session - 04/30/21 1013   ? ? Visit Number 7   ? Number of Visits 17   ? Date for PT Re-Evaluation 06/04/21   ? Authorization Type Medicare/BCBS   ? PT Start Time 6546   ? PT Stop Time 1011   ? PT Time Calculation (min) 39 min   ? Equipment Utilized During Treatment Gait belt   ? Activity Tolerance Patient tolerated treatment well   ? Behavior During Therapy [facility] for tasks assessed/performed   ? ?  ?  ? ?  ? ? ?Past Medical History:  ?Diagnosis Date  ? Actinic keratoses   ? and seborrheic keratoses  ? Arrhythmia   ? afib postoperatively after CABG in 2004, No recurrence  ? Coronary artery disease   ? Diabetes mellitus without complication ([place])   ? diet controlled  ? Dyslipidemia   ? ED (erectile dysfunction)   ? Elevated PSA   ? 2010, workup with Dr. Risa Grill  ? H/O prostate biopsy   ? Hypertension   ? Onychomycosis   ? Prostate cancer ([place])   ? PSA's run approx 7- 2011-2013  ? Right shoulder injury   ? july 2014, Dr. Lennette Bihari Supple  ? Tick bite of abdomen   ? 2013, treated with 3wks of doxycycline, tick engorged 3.32m nodule left thyroid 4/14- seen on carotid u/s  ? ? ?Past Surgical History:  ?Procedure Laterality Date  ? CARDIAC SURGERY    ? CATARACT EXTRACTION, BILATERAL    ? CORONARY ARTERY BYPASS GRAFT  2004  ? ? ?There were no vitals filed for this visit. ? ? Subjective Assessment - 04/30/21 0934   ? ? Subjective Tired because he shaved and showered this AM. Denies falls.   ? Pertinent History CAD, DM, HLD, HTN, prostate CA, CABG 2004; pt reports a bad R shoulder and would like to limit shoulder movements during PT tx   ? Diagnostic tests none recent   ? Patient  Stated Goals improve balance   ? Currently in Pain? No/denies   ? ?  ?  ? ?  ? ? ? ? ? ? ? ? ? ? ? ? ? ? ? ? ? ? ? ? [place] AnaAdult PT Treatment/Exercise - 04/30/21 0001   ? ?  ? Neuro Re-ed   ? Neuro Re-ed Details  with mirror feedback, verbal cues, manual cues: ant/pos weight shifts with/without arm swing, lateral wt shifts with/without reach- improved coordination of movement, but requiring cues to shift weight fully onto supporting foot and increase amplitude of movement   ?  ? Lumbar Exercises: Aerobic  ? Nustep L4x5 min UEs/LEs (cues for pacing to maintain ~ 75 SPM to avoid fatigue)   ?  ? Lumbar Exercises: Standing  ? Wall Slides 15 reps   ? Wall Slides Limitations 2x15; red pball   mini squat  ?  ? Lumbar Exercises: Seated  ? Sit to Stand 10 reps;Limitations   green medball at chest  ? Other Seated Lumbar Exercises sitting open book + clap for high amplitude movement in front of mirror 10x   unable to tolerate with shoulders in abduction d/t R shoulder pain; able  to tolerate with arms below shoulder height  ? ?  ?  ? ?  ? ? ? ? ? ? ? ? ? ? ? ? PT Short Term Goals - 04/28/21 1252   ? ?  ? PT SHORT TERM GOAL #1  ? Title Patient to be independent with initial HEP.   ? Time 3   ? Period Weeks   ? Status Achieved   ? Target Date 04/30/21   ? ?  ?  ? ?  ? ? ? ? PT Long Term Goals - 04/14/21 1412   ? ?  ? PT LONG TERM GOAL #1  ? Title Patient to be independent with advanced HEP.   ? Time 8   ? Period Weeks   ? Status On-going   ? Target Date 06/04/21   ?  ? PT LONG TERM GOAL #2  ? Title Patient to improve MiniBestTest score to atleast 17 to decrease risk of falls.   ? Time 8   ? Period Weeks   ? Status On-going   ? Target Date 06/04/21   ?  ? PT LONG TERM GOAL #3  ? Title Patient to demonstrate <10% increase between TUG and TUG cognitive to improve ability to dual task in home/community.   ? Baseline 81.3% increase on TUG cog   ? Time 8   ? Period Weeks   ? Status On-going   ? Target Date 06/04/21   ?  ? PT LONG TERM  GOAL #4  ? Title Patient to demonstrate gait speed of 2.62 ft/sec in order to score as community ambulator.   ? Baseline 2.56 ft/sec   ? Time 8   ? Period Weeks   ? Status On-going   ? Target Date 06/04/21   ?  ? PT LONG TERM GOAL #5  ? Title Patient to verbalize understanding of fall prevention in home environment information.   ? Time 8   ? Period Weeks   ? Status On-going   ? Target Date 06/04/21   ? ?  ?  ? ?  ? ? ? ? ? ? ? ? Plan - 04/30/21 1013   ? ? Clinical Impression Statement Patient arrived to session without new complaints. Initiated sitting trunk rotation for high amplitude with modifications being made d/t c/o R shoulder pain. Excellent control demonstrated with weighted STS today. Initiated wall squats within limited depth with patient tolerating well and noting LE muscle soreness. Continued working on high amplitude movements with patient demonstrating improved coordination of movement, but requiring cues to shift weight fully onto supporting foot and improve amplitude of movement. Intermittent sitting rest breaks required today d/t c/o increased fatigue this session. Patient otherwise tolerated session well and without complaints upon leaving.   ? Comorbidities CAD, DM, HLD, HTN, prostate CA, CABG 2004   ? PT Treatment/Interventions ADLs/Self Care Home Management;Canalith Repostioning;Cryotherapy;Electrical Stimulation;DME Instruction;Moist Heat;Gait training;Stair training;Functional mobility training;Therapeutic activities;Therapeutic exercise;Balance training;Neuromuscular re-education;Manual techniques;Patient/family education;Passive range of motion;Dry needling;Energy conservation;Vestibular;Taping   ? PT Next Visit Plan forward banding activities while monitoring dizziness; bed mobility, progress calf strength, compliant surface balance, reactive balance, asses gait with cane, edu on fall prevention   ? Consulted and Agree with Plan of Care Patient;Family member/caregiver   ? Family Member  Consulted wife   ? ?  ?  ? ?  ? ? ?Patient will benefit from skilled therapeutic intervention in order to improve the following deficits and impairments:  Abnormal gait, Decreased coordination, Difficulty walking, Impaired  tone, Decreased safety awareness, Decreased endurance, Decreased activity tolerance, Pain, Decreased balance, Decreased knowledge of use of DME, Impaired flexibility, Improper body mechanics, Postural dysfunction, Decreased strength ? ?Visit Diagnosis: ?Muscle weakness (generalized) ? ?Unsteadiness on feet ? ?Other abnormalities of gait and mobility ? ?Other symptoms and signs involving the nervous system ? ? ? ? ?Problem List ?Patient Active Problem List  ? Diagnosis Date Noted  ? Aortic aneurysm ([place]) 03/26/2021  ? Abnormal gait 03/13/2020  ? Bronchitis 03/13/2020  ? Chin laceration 03/13/2020  ? Cough 03/13/2020  ? Cramp and spasm 03/13/2020  ? Disorder of penis 03/13/2020  ? Dizzy spells 03/13/2020  ? Dyspnea 03/13/2020  ? Edema of lower extremity 03/13/2020  ? Elevated PSA 03/13/2020  ? Encounter for general adult medical examination without abnormal findings 03/13/2020  ? Hearing loss in left ear 03/13/2020  ? History of malignant neoplasm of prostate 03/13/2020  ? Impacted cerumen 03/13/2020  ? Low back pain 03/13/2020  ? Malignant tumor of prostate ([place]) 03/13/2020  ? Neuropathy 03/13/2020  ? Orthostatic hypotension 03/13/2020  ? Other long term (current) drug therapy 03/13/2020  ? Other specified abnormal findings of blood chemistry 03/13/2020  ? Other sprain of right hip, initial encounter 03/13/2020  ? Paresthesia 03/13/2020  ? Parkinson's disease ([place]) 03/13/2020  ? Paroxysmal atrial fibrillation ([place]) 03/13/2020  ? Polyneuropathy due to type 2 diabetes mellitus ([place]) 03/13/2020  ? Presence of aortocoronary bypass graft 03/13/2020  ? Sciatica, right side 03/13/2020  ? Shoulder joint pain 03/13/2020  ? Skin sensation disturbance 03/13/2020  ? Type 2 diabetes mellitus without  complications ([place]) 82/64/1583  ? Upper respiratory infection 03/13/2020  ? Vitamin D deficiency 03/13/2020  ? Asymmetrical left sensorineural hearing loss 01/13/2017  ? Right leg numbness 01/14/2015  ? Thyroid nodule

## 2021-05-05 ENCOUNTER — Encounter: Payer: Self-pay | Admitting: Occupational Therapy

## 2021-05-05 ENCOUNTER — Ambulatory Visit: Payer: Medicare Other | Admitting: Occupational Therapy

## 2021-05-05 ENCOUNTER — Ambulatory Visit: Payer: Medicare Other | Attending: Neurology | Admitting: Physical Therapy

## 2021-05-05 ENCOUNTER — Encounter: Payer: Self-pay | Admitting: Physical Therapy

## 2021-05-05 DIAGNOSIS — Z9181 History of falling: Secondary | ICD-10-CM | POA: Diagnosis not present

## 2021-05-05 DIAGNOSIS — M6281 Muscle weakness (generalized): Secondary | ICD-10-CM | POA: Insufficient documentation

## 2021-05-05 DIAGNOSIS — R251 Tremor, unspecified: Secondary | ICD-10-CM

## 2021-05-05 DIAGNOSIS — R41840 Attention and concentration deficit: Secondary | ICD-10-CM | POA: Diagnosis not present

## 2021-05-05 DIAGNOSIS — R2689 Other abnormalities of gait and mobility: Secondary | ICD-10-CM | POA: Insufficient documentation

## 2021-05-05 DIAGNOSIS — R29818 Other symptoms and signs involving the nervous system: Secondary | ICD-10-CM

## 2021-05-05 DIAGNOSIS — R2681 Unsteadiness on feet: Secondary | ICD-10-CM | POA: Insufficient documentation

## 2021-05-05 DIAGNOSIS — R278 Other lack of coordination: Secondary | ICD-10-CM

## 2021-05-05 NOTE — Therapy (Signed)
[facility] ?[address], [address] ?[place], [place], [address] ?Phone: [phone]   Fax:  [phone] ? ?Occupational Therapy Treatment ? ?Patient Details  ?Name: Johnny Fox ?MRN: [id number] ?Date of Birth: 1928/07/13 ?Referring Provider (OT): Alonza Bogus, DO ? ? ?Encounter Date: 05/05/2021 ? ? OT End of Session - 05/05/21 0947   ? ? Visit Number 8   ? Number of Visits 17   ? Date for OT Re-Evaluation 06/14/21   ? Authorization Type Medicare / BCBS supplemental   ? OT Start Time 0932   ? OT Stop Time 1016   ? OT Time Calculation (min) 44 min   ? Activity Tolerance Patient tolerated treatment well   ? Behavior During Therapy [facility] for tasks assessed/performed   ? ?  ?  ? ?  ? ? ?Past Medical History:  ?Diagnosis Date  ? Actinic keratoses   ? and seborrheic keratoses  ? Arrhythmia   ? afib postoperatively after CABG in 2004, No recurrence  ? Coronary artery disease   ? Diabetes mellitus without complication ([place])   ? diet controlled  ? Dyslipidemia   ? ED (erectile dysfunction)   ? Elevated PSA   ? 2010, workup with Dr. Risa Grill  ? H/O prostate biopsy   ? Hypertension   ? Onychomycosis   ? Prostate cancer ([place])   ? PSA's run approx 7- 2011-2013  ? Right shoulder injury   ? july 2014, Dr. Lennette Bihari Supple  ? Tick bite of abdomen   ? 2013, treated with 3wks of doxycycline, tick engorged 3.93m nodule left thyroid 4/14- seen on carotid u/s  ? ? ?Past Surgical History:  ?Procedure Laterality Date  ? CARDIAC SURGERY    ? CATARACT EXTRACTION, BILATERAL    ? CORONARY ARTERY BYPASS GRAFT  2004  ? ? ?There were no vitals filed for this visit. ? ? Subjective Assessment - 05/05/21 0944   ? ? Subjective  "My daughter got elastic laces for my shoes"   ? Patient is accompanied by: Family member   Wife ([name]  ? Pertinent History ET/PD diagnosed approx May 2021; PMH includes repeated falls, CAD, HTN, afib s/p CABG in 2004, hx of prostate cancer, DMT2, and chronic R shoulder pain   ?  Limitations Hearing loss   ? Patient Stated Goals Get as much out of therapy as possible   ? Currently in Pain? No/denies   ? ?  ?  ? ?  ? ? ?ADL: reviewed elastic laces.  Pt and wife asking questions about types of elastic laces.  Pt's daughter purchased one type which pt is utilizing with improved ease, however still has to manage the fastener which somewhat decreases the purpose of the elastic laces.  Pt will continue to practice with current elastic laces, but may need to continue to pursue additional options. ? ?NMR: Reviewed hand exercises with finger walks, abducting/adducting fingers one at a time toward thumb and back toward ulnar side; isolated finger MCP extension; and hook fist (PIP/DIP flexion only) completed x10 each. Therapist providing multimodal cues with demonstration and cues for large deliberate movements with all exercises due to decreased recall of technique during previous session.  ? ?Completed peg board pattern replication with focus on in -hand manipulation and translation.  Pt demonstrating improved coordination and motor control primarily, however still with dropping of pegs ~10% of time as he fatigued.  ? ?Pt reporting decreased strength to open containers and manage fasteners and questioning how  to strengthen fingers and hands.  Therapist provided pt with theraputty and theraputty exercises (see below).  Therapist providing cues for technique with full hand grasp and key pinch.  Pt unable to complete lumbricals without max cues, therefore removed from HEP. ? ?Access Code: [email or web address] ?URL: [email or web address] ?Date: 05/05/2021 ?Prepared by: [facility] ? ?Exercises ?- Putty Squeezes  - 1 x daily - 3 x weekly - 1 sets - 10 reps ?- Rolling Putty on Table  - 1 x daily - 3 x weekly - 1 sets - 10 reps ?- Tip Pinch with Putty  - 1 x daily - 3 x weekly - 1 sets - 10 reps ?- 3-Point Pinch with Putty  - 1 x daily - 3 x weekly - 1 sets - 10  reps ?- Key Pinch with Putty  - 1 x daily - 3 x weekly - 1 sets - 10 reps ? ? ? ? ? ? ? ? ? ? ? ? ? ? ? ? ? ? ? ? ? ? OT Short Term Goals - 04/14/21 1024   ? ?  ? OT SHORT TERM GOAL #1  ? Title Pt will demonstrate independence w/ condition-specific HEP   ? Baseline No HEP at this time   ? Time 4   ? Period Weeks   ? Status On-going   ? Target Date 05/07/21   ?  ? OT SHORT TERM GOAL #2  ? Title Pt will verbalize understanding of strategies to decrease risk of further complication related to PD, including corresponding energy conservation strategies to improve participation in IADLs   ? Baseline Decreased knowledge of condition-specific education   ? Time 4   ? Period Weeks   ? Status On-going   ? Target Date 05/07/21   ?  ? OT SHORT TERM GOAL #3  ? Title Pt will demonstrate independence w/ recommended AE to improve independence w/ self-care/grooming tasks (long handled sponge, reacher, adaptive comb, etc)   ? Baseline Decreased knowledge of AE   ? Time 4   ? Period Weeks   ? Status On-going   ? Target Date 05/07/21   ? ?  ?  ? ?  ? ? ? ? OT Long Term Goals - 04/14/21 1025   ? ?  ? OT LONG TERM GOAL #1  ? Title Pt will improve participation in self-feeding as evidenced by decreasing time to move 5 beans from a bowl w/ a spoon, incorporating compensatory strategies/AE prn   ? Baseline 15.62 sec   ? Time 8   ? Period Weeks   ? Status On-going   ? Target Date 06/04/21   ?  ? OT LONG TERM GOAL #2  ? Title Pt will demonstrate increased ease w/ manipulation of clothing fasteners by decreasing time to complete 4 buttons by at least 5 sec   ? Baseline 44.56 sec to button 4 shirt buttons   ? Time 8   ? Period Weeks   ? Status On-going   ? Target Date 06/04/21   ?  ? OT LONG TERM GOAL #3  ? Title Pt will demonstrate independence w/ at least 1 memory compensatory strategy by d/c   ? Baseline Reports difficulty w/ short-term memory (e.g., names, conversations)   ? Time 8   ? Period Weeks   ? Status On-going   ? Target Date  06/04/21   ?  ? OT LONG TERM GOAL #4  ? Title Pt  will be able to reach forward 6" at moderate range to engage in IADLs with improved ease and without increased pain.   ? Baseline 5" on functional reach test   ? Time 8   ? Period Weeks   ? Status On-going   ? Target Date 06/04/21   ? ?  ?  ? ?  ? ? ? ? ? ? ? ? Plan - 05/05/21 0947   ? ? Clinical Impression Statement Pt tolerating table top activities this session with focus on hand strength and ROM.  Pt continues to benefit from repetition as pt with decreased carryover of education during session and especially with carryover to home environment. Pt will continue to benefit from repetition of HEP and other activities to facilitate increased carryover from session to session and at home.   ? OT Occupational Profile and History Detailed Assessment- Review of Records and additional review of physical, cognitive, psychosocial history related to current functional performance   ? Occupational performance deficits (Please refer to evaluation for details): ADL's;IADL's;Rest and Sleep;Leisure   ? Body Structure / Function / Physical Skills ADL;Dexterity;ROM;Strength;Balance;Coordination;FMC;IADL;Body mechanics;Endurance;UE functional use;Decreased knowledge of use of DME;GMC;Mobility   ? Cognitive Skills Memory   ? Psychosocial Skills Environmental  Adaptations   ? Rehab Potential Good   ? Clinical Decision Making Several treatment options, min-mod task modification necessary   ? Comorbidities Affecting Occupational Performance: May have comorbidities impacting occupational performance   ? Modification or Assistance to Complete Evaluation  Min-Moderate modification of tasks or assist with assess necessary to complete eval   ? OT Frequency 2x / week   ? OT Duration 8 weeks   ? OT Treatment/Interventions Self-care/ADL training;Therapeutic exercise;DME and/or AE instruction;Functional Mobility Training;Cognitive remediation/compensation;Balance training;Aquatic  Therapy;Neuromuscular education;Manual Therapy;Visual/perceptual remediation/compensation;Energy conservation;Therapeutic activities;Patient/family education   ? Plan Trial activity with small items as pt reports increased dropping of

## 2021-05-05 NOTE — Therapy (Signed)
[facility] ?[address], [address] ?[place], [place], [address] ?Phone: [phone]   Fax:  [phone] ? ?Physical Therapy Treatment ? ?Patient Details  ?Name: Johnny Fox ?MRN: [id number] ?Date of Birth: [birth year] ?Referring Provider (PT): Tat, Eustace Quail, DO ? ? ?Encounter Date: 05/05/2021 ? ? PT End of Session - 05/05/21 1118   ? ? Visit Number 8   ? Number of Visits 17   ? Date for PT Re-Evaluation 06/04/21   ? Authorization Type Medicare/BCBS   ? PT Start Time [phone]   ? PT Stop Time 0930   ? PT Time Calculation (min) 40 min   ? Equipment Utilized During Treatment Gait belt   ? Activity Tolerance Patient tolerated treatment well   ? Behavior During Therapy [facility] for tasks assessed/performed   ? ?  ?  ? ?  ? ? ?Past Medical History:  ?Diagnosis Date  ? Actinic keratoses   ? and seborrheic keratoses  ? Arrhythmia   ? afib postoperatively after CABG in 2004, No recurrence  ? Coronary artery disease   ? Diabetes mellitus without complication ([place])   ? diet controlled  ? Dyslipidemia   ? ED (erectile dysfunction)   ? Elevated PSA   ? 2010, workup with Dr. Risa Grill  ? H/O prostate biopsy   ? Hypertension   ? Onychomycosis   ? Prostate cancer ([place])   ? PSA's run approx 7- 2011-2013  ? Right shoulder injury   ? july 2014, Dr. Lennette Bihari Supple  ? Tick bite of abdomen   ? 2013, treated with 3wks of doxycycline, tick engorged 3.75m nodule left thyroid 4/14- seen on carotid u/s  ? ? ?Past Surgical History:  ?Procedure Laterality Date  ? CARDIAC SURGERY    ? CATARACT EXTRACTION, BILATERAL    ? CORONARY ARTERY BYPASS GRAFT  2004  ? ? ?There were no vitals filed for this visit. ? ? Subjective Assessment - 05/05/21 0850   ? ? Subjective Feeing pretty good.   ? Pertinent History CAD, DM, HLD, HTN, prostate CA, CABG 2004; pt reports a bad R shoulder and would like to limit shoulder movements during PT tx   ? Diagnostic tests none recent   ? Patient Stated Goals improve balance   ? Currently in  Pain? No/denies   ? ?  ?  ? ?  ? ? ? ? ? ? ? ? ? ? ? ? ? ? ? ? ? ? ? ? OSeminoleAdult PT Treatment/Exercise - 05/05/21 0001   ? ?  ? Neuro Re-ed   ? Neuro Re-ed Details  quick R/L step backs from 6" step 2x10 each- cueing for quicer pace, step back over pincushion, pick back up  4x each LE and CGA- c/o dizziness requiring rest break; 4 square step with CGA and occasional 1 UE support; gait + head turns to targets- mild imbalance and c/o dizziness   ?  ? Lumbar Exercises: Aerobic  ? Nustep L4x5 min UEs/LEs (cues for pacing to maintain ~ 75 SPM to avoid fatigue)   ? ?  ?  ? ?  ? ? ? ? ? ? ? ? ? ? PT Education - 05/05/21 1117   ? ? Education Details discussion with patient and wife of improved hydration throughnout the day and suggested bringing water bottle to sessions   ? Person(s) Educated Patient;Spouse   ? Methods Explanation   ? Comprehension Verbalized understanding   ? ?  ?  ? ?  ? ? ?  PT Short Term Goals - 04/28/21 1252   ? ?  ? PT SHORT TERM GOAL #1  ? Title Patient to be independent with initial HEP.   ? Time 3   ? Period Weeks   ? Status Achieved   ? Target Date 04/30/21   ? ?  ?  ? ?  ? ? ? ? PT Long Term Goals - 04/14/21 1412   ? ?  ? PT LONG TERM GOAL #1  ? Title Patient to be independent with advanced HEP.   ? Time 8   ? Period Weeks   ? Status On-going   ? Target Date 06/04/21   ?  ? PT LONG TERM GOAL #2  ? Title Patient to improve MiniBestTest score to atleast 17 to decrease risk of falls.   ? Time 8   ? Period Weeks   ? Status On-going   ? Target Date 06/04/21   ?  ? PT LONG TERM GOAL #3  ? Title Patient to demonstrate <10% increase between TUG and TUG cognitive to improve ability to dual task in home/community.   ? Baseline 81.3% increase on TUG cog   ? Time 8   ? Period Weeks   ? Status On-going   ? Target Date 06/04/21   ?  ? PT LONG TERM GOAL #4  ? Title Patient to demonstrate gait speed of 2.62 ft/sec in order to score as community ambulator.   ? Baseline 2.56 ft/sec   ? Time 8   ? Period Weeks   ?  Status On-going   ? Target Date 06/04/21   ?  ? PT LONG TERM GOAL #5  ? Title Patient to verbalize understanding of fall prevention in home environment information.   ? Time 8   ? Period Weeks   ? Status On-going   ? Target Date 06/04/21   ? ?  ?  ? ?  ? ? ? ? ? ? ? ? Plan - 05/05/21 1118   ? ? Clinical Impression Statement Patient arrived to session without new complaints. Worked on addressing bradykinesia with quick step backs; patient demonstrated more difficulty on R>L LE and reported fatigue with increased effort required. Reported c/o dizziness with forward bending activities requiring rest break. Multidirectional stepping over obstacles revealed need for cueing to increase step length and height d/t frequently stepping on/catching feet on obstacles. Overall patient tolerated session well and with good effort however with fatigue requiring frequent sitting rest breaks.   ? Comorbidities CAD, DM, HLD, HTN, prostate CA, CABG 2004   ? PT Treatment/Interventions ADLs/Self Care Home Management;Canalith Repostioning;Cryotherapy;Electrical Stimulation;DME Instruction;Moist Heat;Gait training;Stair training;Functional mobility training;Therapeutic activities;Therapeutic exercise;Balance training;Neuromuscular re-education;Manual techniques;Patient/family education;Passive range of motion;Dry needling;Energy conservation;Vestibular;Taping   ? PT Next Visit Plan forward banding activities while monitoring dizziness; bed mobility, progress calf strength, compliant surface balance, reactive balance   ? Consulted and Agree with Plan of Care Patient;Family member/caregiver   ? Family Member Consulted wife   ? ?  ?  ? ?  ? ? ?Patient will benefit from skilled therapeutic intervention in order to improve the following deficits and impairments:  Abnormal gait, Decreased coordination, Difficulty walking, Impaired tone, Decreased safety awareness, Decreased endurance, Decreased activity tolerance, Pain, Decreased balance,  Decreased knowledge of use of DME, Impaired flexibility, Improper body mechanics, Postural dysfunction, Decreased strength ? ?Visit Diagnosis: ?Muscle weakness (generalized) ? ?Unsteadiness on feet ? ?Other abnormalities of gait and mobility ? ?Other symptoms and signs involving the nervous system ? ? ? ? ?  Problem List ?Patient Active Problem List  ? Diagnosis Date Noted  ? Aortic aneurysm ([place]) 03/26/2021  ? Abnormal gait 03/13/2020  ? Bronchitis 03/13/2020  ? Chin laceration 03/13/2020  ? Cough 03/13/2020  ? Cramp and spasm 03/13/2020  ? Disorder of penis 03/13/2020  ? Dizzy spells 03/13/2020  ? Dyspnea 03/13/2020  ? Edema of lower extremity 03/13/2020  ? Elevated PSA 03/13/2020  ? Encounter for general adult medical examination without abnormal findings 03/13/2020  ? Hearing loss in left ear 03/13/2020  ? History of malignant neoplasm of prostate 03/13/2020  ? Impacted cerumen 03/13/2020  ? Low back pain 03/13/2020  ? Malignant tumor of prostate ([place]) 03/13/2020  ? Neuropathy 03/13/2020  ? Orthostatic hypotension 03/13/2020  ? Other long term (current) drug therapy 03/13/2020  ? Other specified abnormal findings of blood chemistry 03/13/2020  ? Other sprain of right hip, initial encounter 03/13/2020  ? Paresthesia 03/13/2020  ? Parkinson's disease ([place]) 03/13/2020  ? Paroxysmal atrial fibrillation ([place]) 03/13/2020  ? Polyneuropathy due to type 2 diabetes mellitus ([place]) 03/13/2020  ? Presence of aortocoronary bypass graft 03/13/2020  ? Sciatica, right side 03/13/2020  ? Shoulder joint pain 03/13/2020  ? Skin sensation disturbance 03/13/2020  ? Type 2 diabetes mellitus without complications ([place]) 97/58/8325  ? Upper respiratory infection 03/13/2020  ? Vitamin D deficiency 03/13/2020  ? Asymmetrical left sensorineural hearing loss 01/13/2017  ? Right leg numbness 01/14/2015  ? Thyroid nodule 01/14/2015  ? Coronary atherosclerosis of native coronary artery 02/15/2013  ? Essential hypertension, benign 02/15/2013  ?  Pure hypercholesterolemia 02/15/2013  ? Fatigue 02/15/2013  ? ? ?Janene Harvey, PT, DPT ?05/05/21 11:20 AM ? ? ?[place] [facility] ?[address], [address] ?[place]

## 2021-05-07 ENCOUNTER — Ambulatory Visit: Payer: Medicare Other | Admitting: Occupational Therapy

## 2021-05-07 ENCOUNTER — Encounter: Payer: Self-pay | Admitting: Physical Therapy

## 2021-05-07 ENCOUNTER — Ambulatory Visit: Payer: Medicare Other | Admitting: Physical Therapy

## 2021-05-07 ENCOUNTER — Encounter: Payer: Self-pay | Admitting: Occupational Therapy

## 2021-05-07 DIAGNOSIS — R251 Tremor, unspecified: Secondary | ICD-10-CM | POA: Diagnosis not present

## 2021-05-07 DIAGNOSIS — R29818 Other symptoms and signs involving the nervous system: Secondary | ICD-10-CM | POA: Diagnosis not present

## 2021-05-07 DIAGNOSIS — Z9181 History of falling: Secondary | ICD-10-CM | POA: Diagnosis not present

## 2021-05-07 DIAGNOSIS — M6281 Muscle weakness (generalized): Secondary | ICD-10-CM | POA: Diagnosis not present

## 2021-05-07 DIAGNOSIS — R2689 Other abnormalities of gait and mobility: Secondary | ICD-10-CM | POA: Diagnosis not present

## 2021-05-07 DIAGNOSIS — R2681 Unsteadiness on feet: Secondary | ICD-10-CM | POA: Diagnosis not present

## 2021-05-07 DIAGNOSIS — R41840 Attention and concentration deficit: Secondary | ICD-10-CM

## 2021-05-07 NOTE — Therapy (Signed)
?OUTPATIENT OCCUPATIONAL THERAPY TREATMENT NOTE ? ? ?Patient Name: Johnny Fox ?MRN: [id number] ?DOB:06/18/1928, 88 y.o., male ?Today's Date: 05/07/2021 ? ?PCP: Gates, Robert, MD ?REFERRING PROVIDER: Tat, Rebecca S, DO ? ? OT End of Session - 05/07/21 0930   ? ? Visit Number 9   ? Number of Visits 17   ? Date for OT Re-Evaluation 06/14/21   ? Authorization Type Medicare / BCBS supplemental   ? OT Start Time 0927   ? OT Stop Time 1010   ? OT Time Calculation (min) 43 min   ? Activity Tolerance Patient tolerated treatment well   ? Behavior During Therapy WFL for tasks assessed/performed   ? ?  ?  ? ?  ? ?Past Medical History:  ?Diagnosis Date  ? Actinic keratoses   ? and seborrheic keratoses  ? Arrhythmia   ? afib postoperatively after CABG in 2004, No recurrence  ? Coronary artery disease   ? Diabetes mellitus without complication ([facility])   ? diet controlled  ? Dyslipidemia   ? ED (erectile dysfunction)   ? Elevated PSA   ? 2010, workup with Dr. Grapey  ? H/O prostate biopsy   ? Hypertension   ? Onychomycosis   ? Prostate cancer ([facility])   ? PSA's run approx 7- 2011-2013  ? Right shoulder injury   ? july 2014, Dr. Kevin Supple  ? Tick bite of abdomen   ? 2013, treated with 3wks of doxycycline, tick engorged 3.8mm nodule left thyroid 4/14- seen on carotid u/s  ? ?Past Surgical History:  ?Procedure Laterality Date  ? CARDIAC SURGERY    ? CATARACT EXTRACTION, BILATERAL    ? CORONARY ARTERY BYPASS GRAFT  2004  ? ?Patient Active Problem List  ? Diagnosis Date Noted  ? Aortic aneurysm ([facility]) 03/26/2021  ? Abnormal gait 03/13/2020  ? Bronchitis 03/13/2020  ? Chin laceration 03/13/2020  ? Cough 03/13/2020  ? Cramp and spasm 03/13/2020  ? Disorder of penis 03/13/2020  ? Dizzy spells 03/13/2020  ? Dyspnea 03/13/2020  ? Edema of lower extremity 03/13/2020  ? Elevated PSA 03/13/2020  ? Encounter for general adult medical examination without abnormal findings 03/13/2020  ? Hearing loss in left ear 03/13/2020  ? History of  malignant neoplasm of prostate 03/13/2020  ? Impacted cerumen 03/13/2020  ? Low back pain 03/13/2020  ? Malignant tumor of prostate ([facility]) 03/13/2020  ? Neuropathy 03/13/2020  ? Orthostatic hypotension 03/13/2020  ? Other long term (current) drug therapy 03/13/2020  ? Other specified abnormal findings of blood chemistry 03/13/2020  ? Other sprain of right hip, initial encounter 03/13/2020  ? Paresthesia 03/13/2020  ? Parkinson's disease ([facility]) 03/13/2020  ? Paroxysmal atrial fibrillation ([facility]) 03/13/2020  ? Polyneuropathy due to type 2 diabetes mellitus ([facility]) 03/13/2020  ? Presence of aortocoronary bypass graft 03/13/2020  ? Sciatica, right side 03/13/2020  ? Shoulder joint pain 03/13/2020  ? Skin sensation disturbance 03/13/2020  ? Type 2 diabetes mellitus without complications ([facility]) 03/13/2020  ? Upper respiratory infection 03/13/2020  ? Vitamin D deficiency 03/13/2020  ? Asymmetrical left sensorineural hearing loss 01/13/2017  ? Right leg numbness 01/14/2015  ? Thyroid nodule 01/14/2015  ? Coronary atherosclerosis of native coronary artery 02/15/2013  ? Essential hypertension, benign 02/15/2013  ? Pure hypercholesterolemia 02/15/2013  ? Fatigue 02/15/2013  ? ? ?ONSET DATE: [facility] date of dx, May 2021 ? ?REFERRING DIAG: Parkinson's Disease ? ?THERAPY DIAG:  ?Tremor ? ?Muscle weakness (generalized) ? ?Attention and concentration deficit ? ?History of falling ? ?  Other symptoms and signs involving the nervous system ? ? ?PERTINENT HISTORY: ?ET/PD diagnosed approx May 2021; PMH includes repeated falls, CAD, HTN, afib s/p CABG in 2004, hx of prostate cancer, DMT2, and chronic R shoulder pain ? ?PRECAUTIONS: Fall; hearing loss ? ?SUBJECTIVE: ?Pt reports feeling a little dizzy after physical therapy session today ? ?PAIN:  ?Are you having pain? No ? ? ?OBJECTIVE:  ? ?TODAY'S TREATMENT - 05/07/21: ?Reviewed full HEP, returning demonstration of all exercises w/ OT answering pt questions prn: finger flicks w/ elbows extended;  forearm sup/pro; and thumb-to-finger opposition, opening hand wide each rep. Also reviewed seated punches, lower trunk rotation in supine, and hand exercises (pt did not return demo). Pt required verbal cues and modeling w/ finger flicks for full wrist and hand extension consistently. ?Coordination activities w/ playing cards completed w/ each UE, including: flipping cards off a deck; rotating cards in-hand/turning cards end-over end; pushing cards off deck using thumb; and flicking card off surface w/ index finger. OT provided demo/modeling of each exercises w/ verbal cues for positioning/pacing prn. Handout reviewed and provided to pt at conclusion of session. OT also problem-solved w/ pt to improve efficacy and decrease pain when dealing cards during bridge games; recommended dealing w/ L hand while holding deck in R hand and opening hand up wide after a few repetitions. ?Placing and removing resistance clothespins (1-6#) on horizontal bar w/ RUE to facilitate functional forward reach in sitting, increased GMC, and hand strength; mild tremor observed that improved slightly w/ repetition. ? ?PREVIOUS TREATMENTS: ?05/05/21 ?Elastic laces ?Reviewed hand exercises ?Pegboard pattern ?Theraputty exercises  ? ?04/30/21 ?Hand exercises: ginger walks, isolated finger MCP ext; and hook fist ?Pegboard activity ?Donning shoes/tying shoelaces ? ?04/28/21 ?ADLs: reviewed recommendations for dish washing; energy conservation; elastic shoelaces ?Self-feeding: practiced w/ smaller foam handles ?Handwriting ?Large amp movement: mid-range reaching to grasp/release cups ? ? ?PATIENT EDUCATION: ?Education details: Continued condition-specific education and updated HEP (see above) ?Person educated: Patient ?Education method: Explanation, Demonstration, Verbal cues, and Handouts ?Education comprehension: verbalized understanding and returned demonstration ? ? ?[place] Code: [id number] and [id number] ? ? OT Short Term  Goals              Target Date: 05/07/21  ? ? STG #1  ? Title Pt will demonstrate independence w/ condition-specific HEP   ? Baseline No HEP at this time   ? Status MET - 05/07/21  ?  ? STG #2  ? Title Pt will verbalize understanding of strategies to decrease risk of further complication related to PD, including corresponding energy conservation strategies to improve participation in IADLs   ? Baseline Decreased knowledge of condition-specific education   ? Status MET - 04/30/21  ?  ? STG #3  ? Title Pt will demonstrate independence w/ recommended AE to improve independence w/ self-care/grooming tasks (long handled sponge, reacher, adaptive comb, etc)   ? Baseline Decreased knowledge of AE   ? Status On-going  ? ?  ?  ? ? OT Long Term Goals               Target Date: 06/04/21  ? ? LTG #1  ? Title Pt will improve participation in self-feeding as evidenced by decreasing time to move 5 beans from a bowl w/ a spoon, incorporating compensatory strategies/AE prn   ? Baseline 15.62 sec   ? Status On-going   ?  ? LTG #2  ? Title Pt will demonstrate increased ease w/ manipulation of clothing  fasteners by decreasing time to complete 4 buttons by at least 5 sec   ? Baseline 44.56 sec to button 4 shirt buttons   ? Status On-going  ?  ? LTG #3  ? Title Pt will demonstrate independence w/ at least 1 memory compensatory strategy by d/c   ? Baseline Reports difficulty w/ short-term memory (e.g., names, conversations)   ? Status On-going  ?  ? LTG #4  ? Title Pt will be able to reach forward 6" at moderate range to engage in IADLs with improved ease and without increased pain.   ? Baseline 5" on functional reach test   ? Status On-going  ?  ? ? Plan - 04/23/21 1022   ? ? Clinical Impression Statement Pt continues to progress well toward goals and appears motivated by progress in therapy. OT reviewed current HEP in full this session to assist w/ carryover to home due to pt's report of having a lot of exercises between OT and PT. OT  emphasized importance of large, deliberate movements during tasks, providing multimodal cues and facilitation when incorporating these principles into therapeutic activities completed in session. Pt verbalizes unders

## 2021-05-07 NOTE — Therapy (Signed)
[facility] ?[address], [address] ?[place], [place], [address] ?Phone: [phone]   Fax:  [phone] ? ?Physical Therapy Treatment ? ?Patient Details  ?Name: Johnny Fox ?MRN: [id number] ?Date of Birth: [birth year] ?Referring Provider (PT): Tat, Eustace Quail, DO ? ? ?Encounter Date: 05/07/2021 ? ? PT End of Session - 05/07/21 0926   ? ? Visit Number 9   ? Number of Visits 17   ? Date for PT Re-Evaluation 06/04/21   ? Authorization Type Medicare/BCBS   ? PT Start Time [phone]   ? PT Stop Time 0925   ? PT Time Calculation (min) 39 min   ? Equipment Utilized During Treatment Gait belt   ? Activity Tolerance Patient tolerated treatment well   ? Behavior During Therapy [facility] for tasks assessed/performed   ? ?  ?  ? ?  ? ? ?Past Medical History:  ?Diagnosis Date  ? Actinic keratoses   ? and seborrheic keratoses  ? Arrhythmia   ? afib postoperatively after CABG in 2004, No recurrence  ? Coronary artery disease   ? Diabetes mellitus without complication ([place])   ? diet controlled  ? Dyslipidemia   ? ED (erectile dysfunction)   ? Elevated PSA   ? 2010, workup with Dr. Risa Grill  ? H/O prostate biopsy   ? Hypertension   ? Onychomycosis   ? Prostate cancer ([place])   ? PSA's run approx 7- 2011-2013  ? Right shoulder injury   ? july 2014, Dr. Lennette Bihari Supple  ? Tick bite of abdomen   ? 2013, treated with 3wks of doxycycline, tick engorged 3.62m nodule left thyroid 4/14- seen on carotid u/s  ? ? ?Past Surgical History:  ?Procedure Laterality Date  ? CARDIAC SURGERY    ? CATARACT EXTRACTION, BILATERAL    ? CORONARY ARTERY BYPASS GRAFT  2004  ? ? ?There were no vitals filed for this visit. ? ? Subjective Assessment - 05/07/21 0848   ? ? Subjective reports that he forgot his water bottle.   ? Pertinent History CAD, DM, HLD, HTN, prostate CA, CABG 2004; pt reports a bad R shoulder and would like to limit shoulder movements during PT tx   ? Diagnostic tests none recent   ? Patient Stated Goals improve  balance   ? Currently in Pain? No/denies   ? ?  ?  ? ?  ? ? ? ? ? ? ? ? ? ? ? ? ? ? ? ? ? ? ? ? OMerrimanAdult PT Treatment/Exercise - 05/07/21 0001   ? ?  ? Neuro Re-ed   ? Neuro Re-ed Details  R/L fwd stepping + head nods to targets 2x5 each LE with CGA and no UE support; R/L back stepping + head nods to targets 2x5 each LE with CGA and no UE support; stepping backwards over hurdle 10x each, rockerboard ant/pos and M/L static balance and rocking with 1-2 finger support   ?  ? Lumbar Exercises: Aerobic  ? Nustep L4x5 min UEs/LEs (cues for pacing to maintain ~ 75 SPM to avoid fatigue)   ? ?  ?  ? ?  ? ? ? ? ? ? ? ? ? ? ? ? PT Short Term Goals - 04/28/21 1252   ? ?  ? PT SHORT TERM GOAL #1  ? Title Patient to be independent with initial HEP.   ? Time 3   ? Period Weeks   ? Status Achieved   ? Target  Date 04/30/21   ? ?  ?  ? ?  ? ? ? ? PT Long Term Goals - 04/14/21 1412   ? ?  ? PT LONG TERM GOAL #1  ? Title Patient to be independent with advanced HEP.   ? Time 8   ? Period Weeks   ? Status On-going   ? Target Date 06/04/21   ?  ? PT LONG TERM GOAL #2  ? Title Patient to improve MiniBestTest score to atleast 17 to decrease risk of falls.   ? Time 8   ? Period Weeks   ? Status On-going   ? Target Date 06/04/21   ?  ? PT LONG TERM GOAL #3  ? Title Patient to demonstrate <10% increase between TUG and TUG cognitive to improve ability to dual task in home/community.   ? Baseline 81.3% increase on TUG cog   ? Time 8   ? Period Weeks   ? Status On-going   ? Target Date 06/04/21   ?  ? PT LONG TERM GOAL #4  ? Title Patient to demonstrate gait speed of 2.62 ft/sec in order to score as community ambulator.   ? Baseline 2.56 ft/sec   ? Time 8   ? Period Weeks   ? Status On-going   ? Target Date 06/04/21   ?  ? PT LONG TERM GOAL #5  ? Title Patient to verbalize understanding of fall prevention in home environment information.   ? Time 8   ? Period Weeks   ? Status On-going   ? Target Date 06/04/21   ? ?  ?  ? ?  ? ? ? ? ? ? ? ? Plan  - 05/07/21 0926   ? ? Clinical Impression Statement Patient arrived to session without new complaints. Worked on stepping strategy with head nods for added vestibular challenge. Patient able to perform without UE support with exception of intermittent light touch to stabilize. Focused on backwards stepping over obstacle with cueing required for increasing step height and length. Much improved form and foot clearance after several reps and more success with L>R LE. Balance work on compliant surface required cues for tall posture and use of minor UE support. Most difficulty controlling posterior LOB. Patient with improved energy levels during today?s session and without complaints upon leaving.   ? Comorbidities CAD, DM, HLD, HTN, prostate CA, CABG 2004   ? PT Treatment/Interventions ADLs/Self Care Home Management;Canalith Repostioning;Cryotherapy;Electrical Stimulation;DME Instruction;Moist Heat;Gait training;Stair training;Functional mobility training;Therapeutic activities;Therapeutic exercise;Balance training;Neuromuscular re-education;Manual techniques;Patient/family education;Passive range of motion;Dry needling;Energy conservation;Vestibular;Taping   ? PT Next Visit Plan forward banding activities while monitoring dizziness; bed mobility, progress calf strength, compliant surface balance, reactive balance   ? Consulted and Agree with Plan of Care Patient;Family member/caregiver   ? Family Member Consulted wife   ? ?  ?  ? ?  ? ? ?Patient will benefit from skilled therapeutic intervention in order to improve the following deficits and impairments:  Abnormal gait, Decreased coordination, Difficulty walking, Impaired tone, Decreased safety awareness, Decreased endurance, Decreased activity tolerance, Pain, Decreased balance, Decreased knowledge of use of DME, Impaired flexibility, Improper body mechanics, Postural dysfunction, Decreased strength ? ?Visit Diagnosis: ?Other symptoms and signs involving the nervous  system ? ?Unsteadiness on feet ? ?Other abnormalities of gait and mobility ? ? ? ? ?Problem List ?Patient Active Problem List  ? Diagnosis Date Noted  ? Aortic aneurysm ([place]) 03/26/2021  ? Abnormal gait 03/13/2020  ? Bronchitis 03/13/2020  ?  Chin laceration 03/13/2020  ? Cough 03/13/2020  ? Cramp and spasm 03/13/2020  ? Disorder of penis 03/13/2020  ? Dizzy spells 03/13/2020  ? Dyspnea 03/13/2020  ? Edema of lower extremity 03/13/2020  ? Elevated PSA 03/13/2020  ? Encounter for general adult medical examination without abnormal findings 03/13/2020  ? Hearing loss in left ear 03/13/2020  ? History of malignant neoplasm of prostate 03/13/2020  ? Impacted cerumen 03/13/2020  ? Low back pain 03/13/2020  ? Malignant tumor of prostate ([place]) 03/13/2020  ? Neuropathy 03/13/2020  ? Orthostatic hypotension 03/13/2020  ? Other long term (current) drug therapy 03/13/2020  ? Other specified abnormal findings of blood chemistry 03/13/2020  ? Other sprain of right hip, initial encounter 03/13/2020  ? Paresthesia 03/13/2020  ? Parkinson's disease ([place]) 03/13/2020  ? Paroxysmal atrial fibrillation ([place]) 03/13/2020  ? Polyneuropathy due to type 2 diabetes mellitus ([place]) 03/13/2020  ? Presence of aortocoronary bypass graft 03/13/2020  ? Sciatica, right side 03/13/2020  ? Shoulder joint pain 03/13/2020  ? Skin sensation disturbance 03/13/2020  ? Type 2 diabetes mellitus without complications ([place]) 31/51/7616  ? Upper respiratory infection 03/13/2020  ? Vitamin D deficiency 03/13/2020  ? Asymmetrical left sensorineural hearing loss 01/13/2017  ? Right leg numbness 01/14/2015  ? Thyroid nodule 01/14/2015  ? Coronary atherosclerosis of native coronary artery 02/15/2013  ? Essential hypertension, benign 02/15/2013  ? Pure hypercholesterolemia 02/15/2013  ? Fatigue 02/15/2013  ? ? ?Janene Harvey, PT, DPT ?05/07/21 9:27 AM ? ? ?[facility] ?[address], [address] ?[place], [place],  [address] ?Phone: [phone]   Fax:  [phone] ? ?Name: LENNART GLADISH ?MRN: [id number] ?Date of Birth: 1928-06-12 ? ? ? ?

## 2021-05-12 ENCOUNTER — Encounter: Payer: Self-pay | Admitting: Physical Therapy

## 2021-05-12 ENCOUNTER — Ambulatory Visit: Payer: Medicare Other | Admitting: Physical Therapy

## 2021-05-12 ENCOUNTER — Ambulatory Visit: Payer: Medicare Other | Admitting: Occupational Therapy

## 2021-05-12 ENCOUNTER — Encounter: Payer: Self-pay | Admitting: Occupational Therapy

## 2021-05-12 DIAGNOSIS — M6281 Muscle weakness (generalized): Secondary | ICD-10-CM | POA: Diagnosis not present

## 2021-05-12 DIAGNOSIS — R2689 Other abnormalities of gait and mobility: Secondary | ICD-10-CM

## 2021-05-12 DIAGNOSIS — R29818 Other symptoms and signs involving the nervous system: Secondary | ICD-10-CM | POA: Diagnosis not present

## 2021-05-12 DIAGNOSIS — Z9181 History of falling: Secondary | ICD-10-CM | POA: Diagnosis not present

## 2021-05-12 DIAGNOSIS — R251 Tremor, unspecified: Secondary | ICD-10-CM | POA: Diagnosis not present

## 2021-05-12 DIAGNOSIS — R278 Other lack of coordination: Secondary | ICD-10-CM

## 2021-05-12 DIAGNOSIS — R2681 Unsteadiness on feet: Secondary | ICD-10-CM

## 2021-05-12 NOTE — Therapy (Signed)
?OUTPATIENT OCCUPATIONAL THERAPY TREATMENT NOTE ? ? ?Patient Name: Johnny Fox ?MRN: [id number] ?DOB:15-Oct-1928, 88 y.o., male ?Today's Date: 05/12/2021 ? ?PCP: Josetta Huddle, MD ?REFERRING PROVIDER: Ludwig Clarks, DO ? ?Progress Note ?Reporting Period 04/09/2021 to 05/12/2021 ? ?See note below for Objective Data and Assessment of Progress/Goals.  ? ?  ? ?END OF SESSION:  ? OT End of Session - 05/12/21 0936   ? ? Visit Number 10   ? Number of Visits 17   ? Date for OT Re-Evaluation 06/14/21   ? Authorization Type Medicare / BCBS supplemental   ? OT Start Time [phone]   ? OT Stop Time 1015   ? OT Time Calculation (min) 41 min   ? Activity Tolerance Patient tolerated treatment well   ? Behavior During Therapy [facility] for tasks assessed/performed   ? ?  ?  ? ?  ? ? ?Past Medical History:  ?Diagnosis Date  ? Actinic keratoses   ? and seborrheic keratoses  ? Arrhythmia   ? afib postoperatively after CABG in 2004, No recurrence  ? Coronary artery disease   ? Diabetes mellitus without complication ([place])   ? diet controlled  ? Dyslipidemia   ? ED (erectile dysfunction)   ? Elevated PSA   ? 2010, workup with Dr. Risa Grill  ? H/O prostate biopsy   ? Hypertension   ? Onychomycosis   ? Prostate cancer ([place])   ? PSA's run approx 7- 2011-2013  ? Right shoulder injury   ? july 2014, Dr. Lennette Bihari Supple  ? Tick bite of abdomen   ? 2013, treated with 3wks of doxycycline, tick engorged 3.74m nodule left thyroid 4/14- seen on carotid u/s  ? ?Past Surgical History:  ?Procedure Laterality Date  ? CARDIAC SURGERY    ? CATARACT EXTRACTION, BILATERAL    ? CORONARY ARTERY BYPASS GRAFT  2004  ? ?Patient Active Problem List  ? Diagnosis Date Noted  ? Aortic aneurysm ([place] 03/26/2021  ? Abnormal gait 03/13/2020  ? Bronchitis 03/13/2020  ? Chin laceration 03/13/2020  ? Cough 03/13/2020  ? Cramp and spasm 03/13/2020  ? Disorder of penis 03/13/2020  ? Dizzy spells 03/13/2020  ? Dyspnea 03/13/2020  ? Edema of lower extremity 03/13/2020  ? Elevated PSA  03/13/2020  ? Encounter for general adult medical examination without abnormal findings 03/13/2020  ? Hearing loss in left ear 03/13/2020  ? History of malignant neoplasm of prostate 03/13/2020  ? Impacted cerumen 03/13/2020  ? Low back pain 03/13/2020  ? Malignant tumor of prostate ([place] 03/13/2020  ? Neuropathy 03/13/2020  ? Orthostatic hypotension 03/13/2020  ? Other long term (current) drug therapy 03/13/2020  ? Other specified abnormal findings of blood chemistry 03/13/2020  ? Other sprain of right hip, initial encounter 03/13/2020  ? Paresthesia 03/13/2020  ? Parkinson's disease ([place] 03/13/2020  ? Paroxysmal atrial fibrillation ([place] 03/13/2020  ? Polyneuropathy due to type 2 diabetes mellitus ([place] 03/13/2020  ? Presence of aortocoronary bypass graft 03/13/2020  ? Sciatica, right side 03/13/2020  ? Shoulder joint pain 03/13/2020  ? Skin sensation disturbance 03/13/2020  ? Type 2 diabetes mellitus without complications ([place] 097/84/7841 ? Upper respiratory infection 03/13/2020  ? Vitamin D deficiency 03/13/2020  ? Asymmetrical left sensorineural hearing loss 01/13/2017  ? Right leg numbness 01/14/2015  ? Thyroid nodule 01/14/2015  ? Coronary atherosclerosis of native coronary artery 02/15/2013  ? Essential hypertension, benign 02/15/2013  ? Pure hypercholesterolemia 02/15/2013  ? Fatigue 02/15/2013  ? ? ?ONSET DATE: [place] date  of dx, May 2021 ? ?REFERRING DIAG: Parkinson's Disease ? ?THERAPY DIAG:  ?Other symptoms and signs involving the nervous system ? ?Unsteadiness on feet ? ?Other abnormalities of gait and mobility ? ?Muscle weakness (generalized) ? ?Other lack of coordination ? ? ?PERTINENT HISTORY: ET/PD diagnosed approx May 2021; PMH includes repeated falls, CAD, HTN, afib s/p CABG in 2004, hx of prostate cancer, DMT2, and chronic R shoulder pain ? ?PRECAUTIONS: Fall; hearing loss ? ?SUBJECTIVE: "These are the only elastic laces that we have been able to find" ? ?PAIN:  ?Are you having pain? Yes: NPRS  scale: 1/10 ?Pain location: Abdomen ?Pain description: Cramping ? ? ? ?OBJECTIVE:  ? ?TODAY'S TREATMENT: ?[place]: Engaged in jigsaw puzzle and picking up coins to place in coin slot with focus on in-hand manipulation of puzzle pieces and translation with coins.  Pt demonstrating improved motor control and coordination bilaterally with both [facility] tasks. ? ?ADL: simulated dressing with bag exercises with focus on large amplitude movements.  Pt with increased difficulty on RLE than LLE.  Educated on increased reach.  Therapist demonstrated with pants with focus on large amplitude, smooth consecutive movement.  Therapist educated on use of large pulls when pulling pants up over leg instead of small pinches to increase amplitude of movement.  Overhead to simulate UB dressing.  Diagonal reaching behind to simulate washing/drying back or pulling shirt down back.  Pt able to complete each exercise 1 set of 10 with min cues for technique.  Educated on impacts of PD creating smaller movements and recommendation for large amplitude movements to combat the small movements.   ?Educated on AE for LB dressing and bathing.  Therapist demonstrating use of reacher and long handled sponge, however pt expressing desire to focus on quality of pt's movements with above bag exercises before resorting to use of AE. ? ? ? ?  ?  ?PATIENT EDUCATION:  ?Education details: Continued condition-specific education and updated HEP (see above) ?Person educated: Patient ?Education method: Explanation, Demonstration, Verbal cues, and Handouts ?Education comprehension: verbalized understanding and returned demonstration ?  ?  ?[place] Code: [id number] and [id number] ?  ?  OT Short Term Goals              Target Date: 05/07/21  ?  ?    ?  STG #1  ?  Title Pt will demonstrate independence w/ condition-specific HEP   ?  Baseline No HEP at this time   ?  Status MET - 05/07/21  ?     ?  STG #2  ?  Title Pt will verbalize understanding of  strategies to decrease risk of further complication related to PD, including corresponding energy conservation strategies to improve participation in IADLs   ?  Baseline Decreased knowledge of condition-specific education   ?  Status MET - 04/30/21  ?     ?  STG #3  ?  Title Pt will demonstrate independence w/ recommended AE to improve independence w/ self-care/grooming tasks (long handled sponge, reacher, adaptive comb, etc)   ?  Baseline Decreased knowledge of AE   ?  Status On-going  ?  ?   ?  ?  ?  OT Long Term Goals               Target Date: 06/04/21  ?  ?    ?  LTG #1  ?  Title Pt will improve participation in self-feeding as evidenced by decreasing time to move 5 beans from  a bowl w/ a spoon, incorporating compensatory strategies/AE prn   ?  Baseline 15.62 sec   ?  Status On-going   ?     ?  LTG #2  ?  Title Pt will demonstrate increased ease w/ manipulation of clothing fasteners by decreasing time to complete 4 buttons by at least 5 sec   ?  Baseline 44.56 sec to button 4 shirt buttons   ?  Status On-going  ?     ?  LTG #3  ?  Title Pt will demonstrate independence w/ at least 1 memory compensatory strategy by d/c   ?  Baseline Reports difficulty w/ short-term memory (e.g., names, conversations)   ?  Status On-going  ?     ?  LTG #4  ?  Title Pt will be able to reach forward 6" at moderate range to engage in IADLs with improved ease and without increased pain.   ?  Baseline 5" on functional reach test   ?  Status On-going  ?  ?  ?  Plan - 04/23/21 1022   ?  ?  Clinical Impression Statement Pt is making great progress towards goals.  Pt is demonstrating good carryover of large amplitude exercises and [place] tasks at home.  Pt is utilizing elastic shoe laces to assist with donning and fastening shoes.  Pt continues to report difficulty with LB dressing and drying back, therefore incorporated bag exercises with focus on functional tasks for improved ROM and ability to complete dressing tasks.  Pt continues to  demonstrate decreased amplitude of movements and frequent dropping of smaller items. Pt consistently demonstrates increased difficulty with R hand (dominant) compared to L hand.   ?  OT Occupational Profile and History

## 2021-05-12 NOTE — Therapy (Signed)
[place] [facility] ?[address], [address] ?[place], [place], [address] ?Phone: [phone]   Fax:  [phone] ? ?Physical Therapy Progress Note ? ?Patient Details  ?Name: Johnny Fox ?MRN: [id number] ?Date of Birth: June 30, 1928 ?Referring Provider (PT): Tat, Eustace Quail, DO ? ? ?Progress Note ?Reporting Period 04/09/21 to 05/12/21 ? ?See note below for Objective Data and Assessment of Progress/Goals.  ? ? ?Encounter Date: 05/12/2021 ? ? PT End of Session - 05/12/21 0929   ? ? Visit Number 10   ? Number of Visits 17   ? Date for PT Re-Evaluation 06/04/21   ? Authorization Type Medicare/BCBS   ? PT Start Time 0845   ? PT Stop Time 0926   ? PT Time Calculation (min) 41 min   ? Equipment Utilized During Treatment Gait belt   ? Activity Tolerance Patient tolerated treatment well   ? Behavior During Therapy [facility] for tasks assessed/performed   ? ?  ?  ? ?  ? ? ?Past Medical History:  ?Diagnosis Date  ? Actinic keratoses   ? and seborrheic keratoses  ? Arrhythmia   ? afib postoperatively after CABG in 2004, No recurrence  ? Coronary artery disease   ? Diabetes mellitus without complication ([place])   ? diet controlled  ? Dyslipidemia   ? ED (erectile dysfunction)   ? Elevated PSA   ? 2010, workup with Dr. Risa Grill  ? H/O prostate biopsy   ? Hypertension   ? Onychomycosis   ? Prostate cancer ([place])   ? PSA's run approx 7- 2011-2013  ? Right shoulder injury   ? july 2014, Dr. Lennette Bihari Supple  ? Tick bite of abdomen   ? 2013, treated with 3wks of doxycycline, tick engorged 3.2mm nodule left thyroid 4/14- seen on carotid u/s  ? ? ?Past Surgical History:  ?Procedure Laterality Date  ? CARDIAC SURGERY    ? CATARACT EXTRACTION, BILATERAL    ? CORONARY ARTERY BYPASS GRAFT  2004  ? ? ?There were no vitals filed for this visit. ? ? Subjective Assessment - 05/12/21 0843   ? ? Subjective Reports a sharp pain in his R lower abdomen this AM which started last night. Denies recent falls. Reports that his balance  is a little better and is walking better than when he began therapy.   ? Pertinent History CAD, DM, HLD, HTN, prostate CA, CABG 2004; pt reports a bad R shoulder and would like to limit shoulder movements during PT tx   ? Diagnostic tests none recent   ? Patient Stated Goals improve balance   ? Currently in Pain? Yes   ? Pain Score 4    ? Pain Location Abdomen   ? Pain Orientation Right;Lower   ? Pain Descriptors / Indicators Sharp   ? Pain Type Acute pain   ? ?  ?  ? ?  ? ? ? ? ? [facility] PT Assessment - 05/12/21 0849   ? ?  ? Standardized Balance Assessment  ? Standardized Balance Assessment 10 meter walk test   ? 10 Meter Walk 11.67   2.81 ft/sec  ?  ? Mini-BESTest  ? Sit To Stand Normal: Comes to stand without use of hands and stabilizes independently.   ? Rise to Toes < 3 s.   ? Stand on one leg (left) Moderate: < 20 s   ? Stand on one leg (right) Moderate: < 20 s   ? Stand on one leg - lowest score  1   ? Compensatory Stepping Correction - Forward Normal: Recovers independently with a single, large step (second realignement is allowed).   ? Compensatory Stepping Correction - Backward Moderate: More than one step is required to recover equilibrium   ? Compensatory Stepping Correction - Left Lateral Moderate: Several steps to recover equilibrium   ? Compensatory Stepping Correction - Right Lateral Moderate: Several steps to recover equilibrium   ? Stepping Corredtion Lateral - lowest score 1   ? Stance - Feet together, eyes open, firm surface  Normal: 30s   ? Stance - Feet together, eyes closed, foam surface  Moderate: < 30s   posterolateral LOB to R requiring max A  ? Incline - Eyes Closed Severe: Unable   ? Change in Gait Speed Normal: Significantly changes walkling speed without imbalance   ? Walk with head turns - Horizontal Normal: performs head turns with no change in gait speed and good balance   ? Walk with pivot turns Normal: Turns with feet close FAST (< 3 steps) with good balance.   ? Step over obstacles  Normal: Able to step over box with minimal change of gait speed and with good balance.   ? Timed UP & GO with Dual Task Moderate: Dual Task affects either counting OR walking (>10%) when compared to the TUG without Dual Task.   ? Mini-BEST total score 19   ?  ? Timed Up and Go Test  ? Normal TUG (seconds) 13.9   ? Manual TUG (seconds) 14.41   ? Cognitive TUG (seconds) 18.36   ? TUG Comments 3.7% increase with TUG manual, 32.1% increase with TUG cog   ? ?  ?  ? ?  ? ? ? ? ? ? ? ? ? ? ? ? ? ? ? ? ? ? ? ? ? ? ? ? ? PT Education - 05/12/21 0928   ? ? Education Details update to HEP with edu on safety-Access Code [email or web address]; discussion on objective progress and remaining deficits   ? Person(s) Educated Patient;Spouse   ? Methods Explanation;Demonstration;Tactile cues;Verbal cues;Handout   ? Comprehension Verbalized understanding;Returned demonstration   ? ?  ?  ? ?  ? ? ? PT Short Term Goals - 05/12/21 1111   ? ?  ? PT SHORT TERM GOAL #1  ? Title Patient to be independent with initial HEP.   ? Time 3   ? Period Weeks   ? Status Achieved   ? Target Date 04/30/21   ? ?  ?  ? ?  ? ? ? ? PT Long Term Goals - 05/12/21 1111   ? ?  ? PT LONG TERM GOAL #1  ? Title Patient to be independent with advanced HEP.   ? Time 8   ? Period Weeks   ? Status Partially Met   met for current  ? Target Date 06/04/21   ?  ? PT LONG TERM GOAL #2  ? Title Patient to improve MiniBestTest score to atleast 22 to decrease risk of falls.   ? Time 8   ? Period Weeks   ? Status Revised   19  ? Target Date 06/04/21   ?  ? PT LONG TERM GOAL #3  ? Title Patient to demonstrate <10% increase between TUG and TUG cognitive to improve ability to dual task in home/community.   ? Baseline 81.3% increase on TUG cog   ? Time 8   ? Period Weeks   ? Status On-going  32.1% increase in TUG cognitive  ? Target Date 06/04/21   ?  ? PT LONG TERM GOAL #4  ? Title Patient to demonstrate gait speed of 2.62 ft/sec in order to score as community ambulator.   ? Baseline 2.56  ft/sec   ? Time 8   ? Period Weeks   ? Status Achieved   2.81 ft/sec  ? Target Date 06/04/21   ?  ? PT LONG TERM GOAL #5  ? Title Patient to verbalize understanding of fall prevention in home environment information.   ? Time 8   ? Period Weeks   ? Status Achieved   ? Target Date 06/04/21   ?  ? Additional Long Term Goals  ? Additional Long Term Goals Yes   ?  ? PT LONG TERM GOAL #6  ? Title Patient to demonstrate M-CTSIB condition 4 for 15 sec without LOB.   ? Time 3   ? Period Weeks   ? Status New   ? Target Date 06/04/21   ? ?  ?  ? ?  ? ? ? ? ? ? ? ? Plan - 05/12/21 0930   ? ? Clinical Impression Statement Patient arrived to session with report of improvement in balance and walking since initial eval. Denies recent falls. Patient scored 19 on Mini Best Test, improved since initial assessment. Most difficulty was evident with activities on compliant surface with EC, with dual tasking, and with anticipatory balance. Able to demonstrate 32.1% increase in TUG cognitive, which still indicates difficulty with dual tasking but improved. Gait speed now measures 2.81 ft/sec, also improved from last measurement. Updated HEP to address remaining deficits- patient reported understanding. Patient is demonstrating good progress towards goals. Would benefit from additional skilled PT services to address remaining goals.   ? Comorbidities CAD, DM, HLD, HTN, prostate CA, CABG 2004   ? PT Treatment/Interventions ADLs/Self Care Home Management;Canalith Repostioning;Cryotherapy;Electrical Stimulation;DME Instruction;Moist Heat;Gait training;Stair training;Functional mobility training;Therapeutic activities;Therapeutic exercise;Balance training;Neuromuscular re-education;Manual techniques;Patient/family education;Passive range of motion;Dry needling;Energy conservation;Vestibular;Taping   ? PT Next Visit Plan activities on compliant surface with EC, dual tasking, anticipatory balance, forward banding activities while monitoring  dizziness; bed mobility, reactive balance   ? Consulted and Agree with Plan of Care Patient;Family member/caregiver   ? Family Member Consulted wife   ? ?  ?  ? ?  ? ? ?Patient will benefit from skilled thera

## 2021-05-12 NOTE — Patient Instructions (Signed)
Access Code: [email or web address] ?URL: [email or web address] ?Date: 05/12/2021 ?Prepared by: [facility] ? ?Exercises ?- Sit to Stand with Arms Crossed  - 1 x daily - 5 x weekly - 2 sets - 10 reps ?- Standing Single Leg Stance with Counter Support  - 1 x daily - 5 x weekly - 2 sets - 30 sec hold ?- Heel Raises with Counter Support  - 1 x daily - 5 x weekly - 2 sets - 10 reps ?- Standing Balance in Corner  - 1 x daily - 5 x weekly - 2-3 sets - 30 sec hold ?perform in a corner with a chair in front of you; stand on a pillow or couch cushion with eyes closed ?- Staggered Stance Step Throughs  - 1 x daily - 5 x weekly - 2 sets - 10 reps ?- Supine to Right Sidelying Vestibular Habituation  - 1 x daily - 5 x weekly - 2 sets - 10 reps ?- Standing Horizontal Head Rotation Vestibular Habituation  - 1 x daily - 5 x weekly - 2 sets - 10 reps ?

## 2021-05-12 NOTE — Patient Instructions (Addendum)
Bag Exercises:  ?Small trash bag or produce bag works best. ? ?For all exercises, sit with big posture (sit up tall with head up) and use big movements. Perform the following exercises 3 times per week. ? ?Hold bag in both hands in front of you with hands/arms shoulder length apart. Move bag behind your head. Repeat 10 times. ?Hold bag in both hands in front of you with hands/arms shoulder length apart. Lift leg and move bag completely under each foot and back. Repeat 10 times on each side. ?Hold bag in right hand. Move right hand to reach behind shoulder. Then, reach behind back with left hand to pass bag from right hand to left hand. Switch sides. Repeat 10 times on each side. ?

## 2021-05-14 ENCOUNTER — Ambulatory Visit: Payer: Medicare Other | Admitting: Physical Therapy

## 2021-05-14 ENCOUNTER — Encounter: Payer: Self-pay | Admitting: Occupational Therapy

## 2021-05-14 ENCOUNTER — Ambulatory Visit: Payer: Medicare Other | Admitting: Occupational Therapy

## 2021-05-14 ENCOUNTER — Encounter: Payer: Self-pay | Admitting: Physical Therapy

## 2021-05-14 DIAGNOSIS — Z9181 History of falling: Secondary | ICD-10-CM | POA: Diagnosis not present

## 2021-05-14 DIAGNOSIS — M6281 Muscle weakness (generalized): Secondary | ICD-10-CM

## 2021-05-14 DIAGNOSIS — R251 Tremor, unspecified: Secondary | ICD-10-CM

## 2021-05-14 DIAGNOSIS — R2681 Unsteadiness on feet: Secondary | ICD-10-CM

## 2021-05-14 DIAGNOSIS — R41840 Attention and concentration deficit: Secondary | ICD-10-CM

## 2021-05-14 DIAGNOSIS — R29818 Other symptoms and signs involving the nervous system: Secondary | ICD-10-CM

## 2021-05-14 DIAGNOSIS — R2689 Other abnormalities of gait and mobility: Secondary | ICD-10-CM

## 2021-05-14 NOTE — Therapy (Signed)
?OUTPATIENT OCCUPATIONAL THERAPY TREATMENT NOTE ? ? ?Patient Name: Johnny Fox ?MRN: [id number] ?DOB:Nov 28, 1928, 88 y.o., male ?Today's Date: 05/15/2021 ? ?PCP: Josetta Huddle, MD ?REFERRING PROVIDER: Ludwig Clarks, DO ? ? OT End of Session - 05/14/21 0935   ? ? Visit Number 11   ? Number of Visits 17   ? Date for OT Re-Evaluation 06/14/21   ? Authorization Type Medicare / BCBS supplemental   ? OT Start Time 0930   ? OT Stop Time 1015   ? OT Time Calculation (min) 45 min   ? Activity Tolerance Patient tolerated treatment well   ? Behavior During Therapy [facility] for tasks assessed/performed   ? ?  ?  ? ?  ? ?Past Medical History:  ?Diagnosis Date  ? Actinic keratoses   ? and seborrheic keratoses  ? Arrhythmia   ? afib postoperatively after CABG in 2004, No recurrence  ? Coronary artery disease   ? Diabetes mellitus without complication ([place])   ? diet controlled  ? Dyslipidemia   ? ED (erectile dysfunction)   ? Elevated PSA   ? 2010, workup with Dr. Risa Grill  ? H/O prostate biopsy   ? Hypertension   ? Onychomycosis   ? Prostate cancer ([place])   ? PSA's run approx 7- 2011-2013  ? Right shoulder injury   ? july 2014, Dr. Lennette Bihari Supple  ? Tick bite of abdomen   ? 2013, treated with 3wks of doxycycline, tick engorged 3.69mm nodule left thyroid 4/14- seen on carotid u/s  ? ?Past Surgical History:  ?Procedure Laterality Date  ? CARDIAC SURGERY    ? CATARACT EXTRACTION, BILATERAL    ? CORONARY ARTERY BYPASS GRAFT  2004  ? ?Patient Active Problem List  ? Diagnosis Date Noted  ? Aortic aneurysm ([place]) 03/26/2021  ? Abnormal gait 03/13/2020  ? Bronchitis 03/13/2020  ? Chin laceration 03/13/2020  ? Cough 03/13/2020  ? Cramp and spasm 03/13/2020  ? Disorder of penis 03/13/2020  ? Dizzy spells 03/13/2020  ? Dyspnea 03/13/2020  ? Edema of lower extremity 03/13/2020  ? Elevated PSA 03/13/2020  ? Encounter for general adult medical examination without abnormal findings 03/13/2020  ? Hearing loss in left ear 03/13/2020  ? History of  malignant neoplasm of prostate 03/13/2020  ? Impacted cerumen 03/13/2020  ? Low back pain 03/13/2020  ? Malignant tumor of prostate ([place]) 03/13/2020  ? Neuropathy 03/13/2020  ? Orthostatic hypotension 03/13/2020  ? Other long term (current) drug therapy 03/13/2020  ? Other specified abnormal findings of blood chemistry 03/13/2020  ? Other sprain of right hip, initial encounter 03/13/2020  ? Paresthesia 03/13/2020  ? Parkinson's disease ([place]) 03/13/2020  ? Paroxysmal atrial fibrillation ([place]) 03/13/2020  ? Polyneuropathy due to type 2 diabetes mellitus ([place]) 03/13/2020  ? Presence of aortocoronary bypass graft 03/13/2020  ? Sciatica, right side 03/13/2020  ? Shoulder joint pain 03/13/2020  ? Skin sensation disturbance 03/13/2020  ? Type 2 diabetes mellitus without complications ([place]) 42/87/6811  ? Upper respiratory infection 03/13/2020  ? Vitamin D deficiency 03/13/2020  ? Asymmetrical left sensorineural hearing loss 01/13/2017  ? Right leg numbness 01/14/2015  ? Thyroid nodule 01/14/2015  ? Coronary atherosclerosis of native coronary artery 02/15/2013  ? Essential hypertension, benign 02/15/2013  ? Pure hypercholesterolemia 02/15/2013  ? Fatigue 02/15/2013  ? ? ?ONSET DATE: [facility] date of dx, May 2021 ? ?REFERRING DIAG: Parkinson's Disease ? ?THERAPY DIAG:  ?Other symptoms and signs involving the nervous system ? ?Tremor ? ?Muscle weakness (generalized) ? ?  Attention and concentration deficit ? ?History of falling ? ? ?PERTINENT HISTORY: ?ET/PD diagnosed approx May 2021; PMH includes repeated falls, CAD, HTN, afib s/p CABG in 2004, hx of prostate cancer, DMT2, and chronic R shoulder pain ? ?PRECAUTIONS: Fall; hearing loss ? ?SUBJECTIVE: ?Pt reports he and his wife went through and organized all the exercises he has in his folder by discipline, requesting OT check to "make sure they did it right" ? ?PAIN:  ?Are you having pain? No ? ? ?OBJECTIVE:  ? ?TODAY'S TREATMENT - 05/15/21: ?Coordination activities w/ each UE and  BUEs as appropriate, including: turning a small ball w/ fingertips; tossing ball from one hand to the other; tossing/catching ball w/ ipsilateral UE/both hands; pushing cards off deck using thumb; and shuffling a deck of cards. Completed shuffling w/ both hands and then each hand, flipping through cards on tabletop w/ thumb due to difficulty w/ bilateral coordination. OT emphasized importance of large, deliberate movements w/ each activity, providing min-mod verbal/gestural cues, initial demonstration, modeling, and repetition for success. ?Functional reaching activity using ClockAgain app, tapping 1 of 4 colors positioned from hip height to overhead height w/ contralateral UE to facilitate trunk rotation and crossbody reaching while standing. Activity also addressed dual tasking (physical and cognition), standing balance, GMC, and processing speed. OT occasionally had to modify task to repeat color called out via app due to limitations w/ hearing. Targets for RUE only placed at countertop height considering persistent R shoulder pain w/ overhead reach. ? ? ?PATIENT EDUCATION: ?Education details: Continued condition-specific education, answering pt questions prn, and updated coordination HEP (see above) ?Person educated: Patient ?Education method: Explanation, Demonstration, Verbal cues, and Handouts ?Education comprehension: verbalized understanding and returned demonstration ? ? ?Charles Code: [id number] and [id number] ? ? OT Short Term Goals              Target Date: 05/07/21  ? ? STG #1  ? Title Pt will demonstrate independence w/ condition-specific HEP   ? Baseline No HEP at this time   ? Status MET - 05/07/21  ?  ? STG #2  ? Title Pt will verbalize understanding of strategies to decrease risk of further complication related to PD, including corresponding energy conservation strategies to improve participation in IADLs   ? Baseline Decreased knowledge of condition-specific education   ? Status  MET - 04/30/21  ?  ? STG #3  ? Title Pt will demonstrate independence w/ recommended AE to improve independence w/ self-care/grooming tasks (long handled sponge, reacher, adaptive comb, etc)   ? Baseline Decreased knowledge of AE   ? Status On-going  ? ?  ?  ? ? OT Long Term Goals               Target Date: 06/04/21  ? ? LTG #1  ? Title Pt will improve participation in self-feeding as evidenced by decreasing time to move 5 beans from a bowl w/ a spoon, incorporating compensatory strategies/AE prn   ? Baseline 15.62 sec   ? Status On-going   ?  ? LTG #2  ? Title Pt will demonstrate increased ease w/ manipulation of clothing fasteners by decreasing time to complete 4 buttons by at least 5 sec   ? Baseline 44.56 sec to button 4 shirt buttons   ? Status On-going  ?  ? LTG #3  ? Title Pt will demonstrate independence w/ at least 1 memory compensatory strategy by d/c   ? Baseline Reports difficulty w/ short-term  memory (e.g., names, conversations)   ? Status On-going  ?  ? LTG #4  ? Title Pt will be able to reach forward 6" at moderate range to engage in IADLs with improved ease and without increased pain.   ? Baseline 5" on functional reach test   ? Status On-going  ?  ? ? Plan - 04/23/21 1022   ? ? Clinical Impression Statement Pt continues to progress well toward goals and is motivated in therapy. Session today focused on [facility] and continued large amplitude movement, particularly w/ functional reach. Higher level FM skills (e.g., translation, dexterity, and rotation) are more limited when using R hand compared to L hand. During shuffling task, which pt reports has become progressively more difficulty during Bridge w/ friends, OT completed task analysis and graded task down appropriately to address coordination of LUE. Success improved w/ all tasks when incorporating blocked practice and repetition. GM skills observed during ball toss and functional reaching appear improved compared to prior sessions w/ pt continuing to  benefit from cues to incorporate larger movements and trunk/head rotation and turning. OT also briefly reviewed current HEP w/ pt today.  ? OT Occupational Profile and History Detailed Assessment- Review of R

## 2021-05-14 NOTE — Therapy (Signed)
[facility] ?[address], [address] ?[place], [place], [address] ?Phone: [phone]   Fax:  [phone] ? ?Physical Therapy Treatment ? ?Patient Details  ?Name: Johnny Fox ?MRN: [id number] ?Date of Birth: 11-Jul-1928 ?Referring Provider (PT): Tat, Eustace Quail, DO ? ? ?Encounter Date: 05/14/2021 ? ? PT End of Session - 05/14/21 0929   ? ? Visit Number 11   ? Number of Visits 17   ? Date for PT Re-Evaluation 06/04/21   ? Authorization Type Medicare/BCBS   ? PT Start Time [phone]   ? PT Stop Time [id number]   ? PT Time Calculation (min) 41 min   ? Equipment Utilized During Treatment Gait belt   ? Activity Tolerance Patient tolerated treatment well   ? Behavior During Therapy [facility] for tasks assessed/performed   ? ?  ?  ? ?  ? ? ?Past Medical History:  ?Diagnosis Date  ? Actinic keratoses   ? and seborrheic keratoses  ? Arrhythmia   ? afib postoperatively after CABG in 2004, No recurrence  ? Coronary artery disease   ? Diabetes mellitus without complication ([place])   ? diet controlled  ? Dyslipidemia   ? ED (erectile dysfunction)   ? Elevated PSA   ? 2010, workup with Dr. Risa Grill  ? H/O prostate biopsy   ? Hypertension   ? Onychomycosis   ? Prostate cancer ([place])   ? PSA's run approx 7- 2011-2013  ? Right shoulder injury   ? july 2014, Dr. Lennette Bihari Supple  ? Tick bite of abdomen   ? 2013, treated with 3wks of doxycycline, tick engorged 3.38mm nodule left thyroid 4/14- seen on carotid u/s  ? ? ?Past Surgical History:  ?Procedure Laterality Date  ? CARDIAC SURGERY    ? CATARACT EXTRACTION, BILATERAL    ? CORONARY ARTERY BYPASS GRAFT  2004  ? ? ?There were no vitals filed for this visit. ? ? Subjective Assessment - 05/14/21 0845   ? ? Subjective "So far so good."   ? Pertinent History CAD, DM, HLD, HTN, prostate CA, CABG 2004; pt reports a bad R shoulder and would like to limit shoulder movements during PT tx   ? Diagnostic tests none recent   ? Patient Stated Goals improve balance   ? Currently in  Pain? No/denies   ? ?  ?  ? ?  ? ? ? ? ? ? ? ? ? ? ? ? ? ? ? ? ? ? ? ? [place] Adult PT Treatment/Exercise - 05/14/21 0001   ? ?  ? Neuro Re-ed   ? Neuro Re-ed Details  resisted walking wiht blue TB in anterior and posterior directions in II bars; figure 8 turns around stool moving cones- c/o mild dizziness; backwards walking, walking with EC with CGA-min A; STS with EC 5x; sidestepping and static stance with perturbations on balance beam   ?  ? Lumbar Exercises: Aerobic  ? Nustep L5x5 min UEs/LEs (cues for pacing to maintain ~ 70-75 SPM to avoid fatigue)   ? ?  ?  ? ?  ? ? ? ? ? ? ? ? ? ? PT Education - 05/14/21 0917   ? ? Education Details update to HEP- added STS with EC at counter   ? Person(s) Educated Patient   ? Methods Explanation;Demonstration;Tactile cues;Verbal cues   ? Comprehension Verbalized understanding;Returned demonstration   ? ?  ?  ? ?  ? ? ? PT Short Term Goals - 05/12/21 1111   ? ?  ?  PT SHORT TERM GOAL #1  ? Title Patient to be independent with initial HEP.   ? Time 3   ? Period Weeks   ? Status Achieved   ? Target Date 04/30/21   ? ?  ?  ? ?  ? ? ? ? PT Long Term Goals - 05/12/21 1111   ? ?  ? PT LONG TERM GOAL #1  ? Title Patient to be independent with advanced HEP.   ? Time 8   ? Period Weeks   ? Status Partially Met   met for current  ? Target Date 06/04/21   ?  ? PT LONG TERM GOAL #2  ? Title Patient to improve MiniBestTest score to atleast 22 to decrease risk of falls.   ? Time 8   ? Period Weeks   ? Status Revised   19  ? Target Date 06/04/21   ?  ? PT LONG TERM GOAL #3  ? Title Patient to demonstrate <10% increase between TUG and TUG cognitive to improve ability to dual task in home/community.   ? Baseline 81.3% increase on TUG cog   ? Time 8   ? Period Weeks   ? Status On-going   32.1% increase in TUG cognitive  ? Target Date 06/04/21   ?  ? PT LONG TERM GOAL #4  ? Title Patient to demonstrate gait speed of 2.62 ft/sec in order to score as community ambulator.   ? Baseline 2.56 ft/sec    ? Time 8   ? Period Weeks   ? Status Achieved   2.81 ft/sec  ? Target Date 06/04/21   ?  ? PT LONG TERM GOAL #5  ? Title Patient to verbalize understanding of fall prevention in home environment information.   ? Time 8   ? Period Weeks   ? Status Achieved   ? Target Date 06/04/21   ?  ? Additional Long Term Goals  ? Additional Long Term Goals Yes   ?  ? PT LONG TERM GOAL #6  ? Title Patient to demonstrate M-CTSIB condition 4 for 15 sec without LOB.   ? Time 3   ? Period Weeks   ? Status New   ? Target Date 06/04/21   ? ?  ?  ? ?  ? ? ? ? ? ? ? ? Plan - 05/14/21 0929   ? ? Clinical Impression Statement Patient without new complaints at start of session. Worked on resisted walking in multiple directions to challenge reactive balance. Patient required cues to promote step through gait pattern with longer step length, particularly with backwards walking. Worked on figure 8 turns with c/o mild dizziness and demonstrating occasional scissoring after completing turns. Patient reported marked difficulty with exercises with EC; thus trialed STS with EC and added into HEP for practice at counter top for safety. Patient is demonstrating good progress and effort during sessions. No complaints upon leaving.   ? Comorbidities CAD, DM, HLD, HTN, prostate CA, CABG 2004   ? PT Treatment/Interventions ADLs/Self Care Home Management;Canalith Repostioning;Cryotherapy;Electrical Stimulation;DME Instruction;Moist Heat;Gait training;Stair training;Functional mobility training;Therapeutic activities;Therapeutic exercise;Balance training;Neuromuscular re-education;Manual techniques;Patient/family education;Passive range of motion;Dry needling;Energy conservation;Vestibular;Taping   ? PT Next Visit Plan activities on compliant surface with EC, dual tasking, anticipatory balance, forward banding activities while monitoring dizziness; bed mobility, reactive balance   ? Consulted and Agree with Plan of Care Patient;Family member/caregiver   ?  Family Member Consulted wife   ? ?  ?  ? ?  ? ? ?  Patient will benefit from skilled therapeutic intervention in order to improve the following deficits and impairments:  Abnormal gait, Decreased coordination, Difficulty walking, Impaired tone, Decreased safety awareness, Decreased endurance, Decreased activity tolerance, Pain, Decreased balance, Decreased knowledge of use of DME, Impaired flexibility, Improper body mechanics, Postural dysfunction, Decreased strength ? ?Visit Diagnosis: ?Other symptoms and signs involving the nervous system ? ?Unsteadiness on feet ? ?Other abnormalities of gait and mobility ? ?Muscle weakness (generalized) ? ? ? ? ?Problem List ?Patient Active Problem List  ? Diagnosis Date Noted  ? Aortic aneurysm ([place]) 03/26/2021  ? Abnormal gait 03/13/2020  ? Bronchitis 03/13/2020  ? Chin laceration 03/13/2020  ? Cough 03/13/2020  ? Cramp and spasm 03/13/2020  ? Disorder of penis 03/13/2020  ? Dizzy spells 03/13/2020  ? Dyspnea 03/13/2020  ? Edema of lower extremity 03/13/2020  ? Elevated PSA 03/13/2020  ? Encounter for general adult medical examination without abnormal findings 03/13/2020  ? Hearing loss in left ear 03/13/2020  ? History of malignant neoplasm of prostate 03/13/2020  ? Impacted cerumen 03/13/2020  ? Low back pain 03/13/2020  ? Malignant tumor of prostate ([place]) 03/13/2020  ? Neuropathy 03/13/2020  ? Orthostatic hypotension 03/13/2020  ? Other long term (current) drug therapy 03/13/2020  ? Other specified abnormal findings of blood chemistry 03/13/2020  ? Other sprain of right hip, initial encounter 03/13/2020  ? Paresthesia 03/13/2020  ? Parkinson's disease ([place]) 03/13/2020  ? Paroxysmal atrial fibrillation ([place]) 03/13/2020  ? Polyneuropathy due to type 2 diabetes mellitus ([place]) 03/13/2020  ? Presence of aortocoronary bypass graft 03/13/2020  ? Sciatica, right side 03/13/2020  ? Shoulder joint pain 03/13/2020  ? Skin sensation disturbance 03/13/2020  ? Type 2 diabetes mellitus  without complications ([place]) 91/79/1505  ? Upper respiratory infection 03/13/2020  ? Vitamin D deficiency 03/13/2020  ? Asymmetrical left sensorineural hearing loss 01/13/2017  ? Right leg numbness 01/13/18

## 2021-05-19 ENCOUNTER — Ambulatory Visit: Payer: Medicare Other | Admitting: Occupational Therapy

## 2021-05-19 ENCOUNTER — Ambulatory Visit: Payer: Medicare Other | Admitting: Physical Therapy

## 2021-05-19 ENCOUNTER — Encounter: Payer: Self-pay | Admitting: Physical Therapy

## 2021-05-19 DIAGNOSIS — R41840 Attention and concentration deficit: Secondary | ICD-10-CM

## 2021-05-19 DIAGNOSIS — R251 Tremor, unspecified: Secondary | ICD-10-CM | POA: Diagnosis not present

## 2021-05-19 DIAGNOSIS — R29818 Other symptoms and signs involving the nervous system: Secondary | ICD-10-CM

## 2021-05-19 DIAGNOSIS — R278 Other lack of coordination: Secondary | ICD-10-CM

## 2021-05-19 DIAGNOSIS — R2689 Other abnormalities of gait and mobility: Secondary | ICD-10-CM | POA: Diagnosis not present

## 2021-05-19 DIAGNOSIS — Z9181 History of falling: Secondary | ICD-10-CM | POA: Diagnosis not present

## 2021-05-19 DIAGNOSIS — M6281 Muscle weakness (generalized): Secondary | ICD-10-CM

## 2021-05-19 DIAGNOSIS — R2681 Unsteadiness on feet: Secondary | ICD-10-CM

## 2021-05-19 NOTE — Therapy (Signed)
?OUTPATIENT OCCUPATIONAL THERAPY TREATMENT NOTE ? ? ?Patient Name: Johnny Fox ?MRN: [id number] ?DOB:1928-10-11, 88 y.o., male ?Today's Date: 05/19/2021 ? ?PCP: Josetta Huddle, MD ?REFERRING PROVIDER: Ludwig Clarks, DO ? ? OT End of Session - 05/19/21 0350   ? ? Visit Number 12   ? Number of Visits 17   ? Date for OT Re-Evaluation 06/14/21   ? Authorization Type Medicare / BCBS supplemental   ? OT Start Time 0930   ? OT Stop Time 1015   ? OT Time Calculation (min) 45 min   ? Activity Tolerance Patient tolerated treatment well   ? Behavior During Therapy [facility] for tasks assessed/performed   ? ?  ?  ? ?  ? ? ?Past Medical History:  ?Diagnosis Date  ? Actinic keratoses   ? and seborrheic keratoses  ? Arrhythmia   ? afib postoperatively after CABG in 2004, No recurrence  ? Coronary artery disease   ? Diabetes mellitus without complication ([place])   ? diet controlled  ? Dyslipidemia   ? ED (erectile dysfunction)   ? Elevated PSA   ? 2010, workup with Dr. Risa Grill  ? H/O prostate biopsy   ? Hypertension   ? Onychomycosis   ? Prostate cancer ([place])   ? PSA's run approx 7- 2011-2013  ? Right shoulder injury   ? july 2014, Dr. Lennette Bihari Supple  ? Tick bite of abdomen   ? 2013, treated with 3wks of doxycycline, tick engorged 3.68m nodule left thyroid 4/14- seen on carotid u/s  ? ?Past Surgical History:  ?Procedure Laterality Date  ? CARDIAC SURGERY    ? CATARACT EXTRACTION, BILATERAL    ? CORONARY ARTERY BYPASS GRAFT  2004  ? ?Patient Active Problem List  ? Diagnosis Date Noted  ? Aortic aneurysm ([place] 03/26/2021  ? Abnormal gait 03/13/2020  ? Bronchitis 03/13/2020  ? Chin laceration 03/13/2020  ? Cough 03/13/2020  ? Cramp and spasm 03/13/2020  ? Disorder of penis 03/13/2020  ? Dizzy spells 03/13/2020  ? Dyspnea 03/13/2020  ? Edema of lower extremity 03/13/2020  ? Elevated PSA 03/13/2020  ? Encounter for general adult medical examination without abnormal findings 03/13/2020  ? Hearing loss in left ear 03/13/2020  ? History of  malignant neoplasm of prostate 03/13/2020  ? Impacted cerumen 03/13/2020  ? Low back pain 03/13/2020  ? Malignant tumor of prostate ([place] 03/13/2020  ? Neuropathy 03/13/2020  ? Orthostatic hypotension 03/13/2020  ? Other long term (current) drug therapy 03/13/2020  ? Other specified abnormal findings of blood chemistry 03/13/2020  ? Other sprain of right hip, initial encounter 03/13/2020  ? Paresthesia 03/13/2020  ? Parkinson's disease ([place] 03/13/2020  ? Paroxysmal atrial fibrillation ([place] 03/13/2020  ? Polyneuropathy due to type 2 diabetes mellitus ([place] 03/13/2020  ? Presence of aortocoronary bypass graft 03/13/2020  ? Sciatica, right side 03/13/2020  ? Shoulder joint pain 03/13/2020  ? Skin sensation disturbance 03/13/2020  ? Type 2 diabetes mellitus without complications ([place] 009/38/1829 ? Upper respiratory infection 03/13/2020  ? Vitamin D deficiency 03/13/2020  ? Asymmetrical left sensorineural hearing loss 01/13/2017  ? Right leg numbness 01/14/2015  ? Thyroid nodule 01/14/2015  ? Coronary atherosclerosis of native coronary artery 02/15/2013  ? Essential hypertension, benign 02/15/2013  ? Pure hypercholesterolemia 02/15/2013  ? Fatigue 02/15/2013  ? ? ?ONSET DATE: [facility] date of dx, May 2021 ? ?REFERRING DIAG: Parkinson's Disease ? ?THERAPY DIAG:  ?Muscle weakness (generalized) ? ?Tremor ? ?Attention and concentration deficit ? ?Other lack of  coordination ? ?Other symptoms and signs involving the nervous system ? ? ?PERTINENT HISTORY: ?ET/PD diagnosed approx May 2021; PMH includes repeated falls, CAD, HTN, afib s/p CABG in 2004, hx of prostate cancer, DMT2, and chronic R shoulder pain ? ?PRECAUTIONS: Fall; hearing loss ? ?SUBJECTIVE: ?Pt reports he didn't do much of the exercises this weekend as his daughter came over for the weekend.   ? ?PAIN:  ?Are you having pain? No and Yes: NPRS scale: 2/10 ?Pain location: R toe ?Pain description: aching ?Aggravating factors: standing/walking ?Relieving factors:  rest ? ? ?OBJECTIVE:  ? ?TODAY'S TREATMENT - 05/19/21: ?Dressing tasks: Reviewed bag exercises to simulate dressing tasks.  Pt demonstrating improved large, purposeful movements with simulated LB dressing bilaterally, noticing decreasing amplitude as he fatigued.  Completed 2 set of 10 each exercise.  Pt reports trying to remember the large movements when putting on pants and is noticing an improvement.  Pt with no c/o pain in shoulder with overhead task to simulate donning shirt over head.  Pt able to complete reaching over shoulder and behind back to simulate washing/drying back with improved amplitude of movement and no increased pain in shoulder.  Completed this task 1 set of 10 bilaterally. ?Pt donned/doffed shoes with improved ease with use of figure 4 position and elastic laces for ease of dressing.    ?[place]: In-hand manipulation and translation with stones.  Pt first picking stones up one at a time to place in container, progressing to picking up one at a time and placing in palm then translating to finger tips one at a time to place in container.  Pt demonstrating improved coordination and motor control with this task this session. ?Simulated self-feeding with time 13.72 compared to 15.62 on eval.  Pt reports no longer using built up handles on utensils as he did not feel that they were helping him with his feeding.  Discussed modifications to hand placement on utensil, with pt reporting that he has changed his grasp and is not noticing as much tremor with use of utensil (especially spoon with cereal).  Pt also reports decreased tremors with holding cup.   ?Buttons:  Pt fastened/unfastened 4 buttons in 56.56 seconds.  Engaged in education on technique with recommendation for pt to pull fabric and push button, especially when unbuttoning.  Completed task again after blocked practice with improved time of 44.28 seconds with new technique.  ? ? ?PATIENT EDUCATION: ?Education details: Continued  condition-specific education, answering pt questions prn ?Person educated: Patient ?Education method: Explanation, Demonstration, Verbal cues, and Handouts ?Education comprehension: verbalized understanding and returned demonstration ? ? ?[place] Code: [id number] and [id number] ? ? OT Short Term Goals              Target Date: 05/07/21  ? ? STG #1  ? Title Pt will demonstrate independence w/ condition-specific HEP   ? Baseline No HEP at this time   ? Status MET - 05/07/21  ?  ? STG #2  ? Title Pt will verbalize understanding of strategies to decrease risk of further complication related to PD, including corresponding energy conservation strategies to improve participation in IADLs   ? Baseline Decreased knowledge of condition-specific education   ? Status MET - 04/30/21  ?  ? STG #3  ? Title Pt will demonstrate independence w/ recommended AE to improve independence w/ self-care/grooming tasks (long handled sponge, reacher, adaptive comb, etc)   ? Baseline Decreased knowledge of AE   ? Status On-going  ? ?  ?  ? ?  OT Long Term Goals               Target Date: 06/04/21  ? ? LTG #1  ? Title Pt will improve participation in self-feeding as evidenced by decreasing time to move 5 beans from a bowl w/ a spoon, incorporating compensatory strategies/AE prn   ? Baseline 15.62 sec   ? Status On-going   ?  ? LTG #2  ? Title Pt will demonstrate increased ease w/ manipulation of clothing fasteners by decreasing time to complete 4 buttons by at least 5 sec   ? Baseline 44.56 sec to button 4 shirt buttons   ? Status On-going  ?  ? LTG #3  ? Title Pt will demonstrate independence w/ at least 1 memory compensatory strategy by d/c   ? Baseline Reports difficulty w/ short-term memory (e.g., names, conversations)   ? Status On-going  ?  ? LTG #4  ? Title Pt will be able to reach forward 6" at moderate range to engage in IADLs with improved ease and without increased pain.   ? Baseline 5" on functional reach test   ?  Status On-going  ?  ? ? Plan - 04/23/21 1022   ? ? Clinical Impression Statement Pt fatiguing quickly during simulated and actual LB dressing this session, therefore providing frequent rest breaks and modifications to tasks to allo

## 2021-05-19 NOTE — Therapy (Signed)
[facility] ?[address], [address] ?[place], [place], [address] ?Phone: [phone]   Fax:  [phone] ? ?Physical Therapy Treatment ? ?Patient Details  ?Name: Johnny Fox ?MRN: [id number] ?Date of Birth: [birth year] ?Referring Provider (PT): Tat, Eustace Quail, DO ? ? ?Encounter Date: 05/19/2021 ? ? PT End of Session - 05/19/21 0926   ? ? Visit Number 12   ? Number of Visits 17   ? Date for PT Re-Evaluation 06/04/21   ? Authorization Type Medicare/BCBS   ? PT Start Time [phone]   ? PT Stop Time 1884   ? PT Time Calculation (min) 41 min   ? Equipment Utilized During Treatment Gait belt   ? Activity Tolerance Patient tolerated treatment well;Patient limited by fatigue   ? Behavior During Therapy [facility] for tasks assessed/performed   ? ?  ?  ? ?  ? ? ?Past Medical History:  ?Diagnosis Date  ? Actinic keratoses   ? and seborrheic keratoses  ? Arrhythmia   ? afib postoperatively after CABG in 2004, No recurrence  ? Coronary artery disease   ? Diabetes mellitus without complication ([place])   ? diet controlled  ? Dyslipidemia   ? ED (erectile dysfunction)   ? Elevated PSA   ? 2010, workup with Dr. Risa Grill  ? H/O prostate biopsy   ? Hypertension   ? Onychomycosis   ? Prostate cancer ([place])   ? PSA's run approx 7- 2011-2013  ? Right shoulder injury   ? july 2014, Dr. Lennette Bihari Supple  ? Tick bite of abdomen   ? 2013, treated with 3wks of doxycycline, tick engorged 3.17mm nodule left thyroid 4/14- seen on carotid u/s  ? ? ?Past Surgical History:  ?Procedure Laterality Date  ? CARDIAC SURGERY    ? CATARACT EXTRACTION, BILATERAL    ? CORONARY ARTERY BYPASS GRAFT  2004  ? ? ?There were no vitals filed for this visit. ? ? Subjective Assessment - 05/19/21 0845   ? ? Subjective "I'm realizing that I'm having trouble getting in and out of automobiles."   ? Pertinent History CAD, DM, HLD, HTN, prostate CA, CABG 2004; pt reports a bad R shoulder and would like to limit shoulder movements during PT tx   ?  Diagnostic tests none recent   ? Patient Stated Goals improve balance   ? Currently in Pain? Yes   ? Pain Score 4    ? Pain Location Toe (Comment which one)   ? Pain Orientation Right   ? Pain Descriptors / Indicators Sore   ? Pain Type Acute pain   ? ?  ?  ? ?  ? ? ? ? ? ? ? ? ? ? ? ? ? ? ? ? ? ? ? ? [facility] PT Treatment/Exercise - 05/19/21 0001   ? ?  ? Therapeutic Activites   ? Therapeutic Activities Other Therapeutic Activities   ? Other Therapeutic Activities car transfers in patient's sedan- cueing to sit bottom in first and then swing legs in- advised to drive his SUV next time to assess this   ?  ? Neuro Re-ed   ? Neuro Re-ed Details  in front of mirror: ant/pos and side to side weight shifts with and without arm swing/reach and CGA and demonstrating for safety and greater success; R/L torso twist + clap; sitting large amplitude hip ABD/ADD over cone to improve LE management during bed mobility and car transfer 2x5 each side with cueing to "stomp"   ?  ?  Lumbar Exercises: Aerobic  ? Nustep L5x5 min UEs/LEs (cues for pacing to maintain ~ 70-75 SPM to avoid fatigue)   ?  ? Lumbar Exercises: Seated  ? Sit to Stand 10 reps;Limitations   wihtout UEs and with cues to transfer "nose over toes"  ? ?  ?  ? ?  ? ? ? ? ? ? ? ? ? ? ? ? PT Short Term Goals - 05/12/21 1111   ? ?  ? PT SHORT TERM GOAL #1  ? Title Patient to be independent with initial HEP.   ? Time 3   ? Period Weeks   ? Status Achieved   ? Target Date 04/30/21   ? ?  ?  ? ?  ? ? ? ? PT Long Term Goals - 05/19/21 0928   ? ?  ? PT LONG TERM GOAL #1  ? Title Patient to be independent with advanced HEP.   ? Time 8   ? Period Weeks   ? Status Partially Met   met for current  ? Target Date 06/04/21   ?  ? PT LONG TERM GOAL #2  ? Title Patient to improve MiniBestTest score to atleast 22 to decrease risk of falls.   ? Time 8   ? Period Weeks   ? Status Revised   19  ? Target Date 06/04/21   ?  ? PT LONG TERM GOAL #3  ? Title Patient to demonstrate <10%  increase between TUG and TUG cognitive to improve ability to dual task in home/community.   ? Baseline 81.3% increase on TUG cog   ? Time 8   ? Period Weeks   ? Status On-going   32.1% increase in TUG cognitive  ? Target Date 06/04/21   ?  ? PT LONG TERM GOAL #4  ? Title Patient to demonstrate gait speed of 2.62 ft/sec in order to score as community ambulator.   ? Baseline 2.56 ft/sec   ? Time 8   ? Period Weeks   ? Status Achieved   2.81 ft/sec  ? Target Date 06/04/21   ?  ? PT LONG TERM GOAL #5  ? Title Patient to verbalize understanding of fall prevention in home environment information.   ? Time 8   ? Period Weeks   ? Status Achieved   ? Target Date 06/04/21   ?  ? PT LONG TERM GOAL #6  ? Title Patient to demonstrate M-CTSIB condition 4 for 15 sec without LOB.   ? Time 3   ? Period Weeks   ? Status On-going   ? Target Date 06/04/21   ? ?  ?  ? ?  ? ? ? ? ? ? ? ? Plan - 05/19/21 0927   ? ? Clinical Impression Statement Patient arrived to session with report of realizing difficulty with car transfers. Reviewed this in patient?s car and provided cues which improved control and safety upon sitting. Worked on standing weight shifts for higher amplitude movements. Patient with difficulty demonstrating full weight shift in all directions, coordinating movements, and demonstrated unsteadiness, requiring CGA. Required intermittent sitting rest breaks in between sitting activities. Patient tolerated session with some increased fatigue today however without complaints at end of session.   ? Comorbidities CAD, DM, HLD, HTN, prostate CA, CABG 2004   ? PT Treatment/Interventions ADLs/Self Care Home Management;Canalith Repostioning;Cryotherapy;Electrical Stimulation;DME Instruction;Moist Heat;Gait training;Stair training;Functional mobility training;Therapeutic activities;Therapeutic exercise;Balance training;Neuromuscular re-education;Manual techniques;Patient/family education;Passive range of motion;Dry needling;Energy  conservation;Vestibular;Taping   ?  PT Next Visit Plan activities on compliant surface with EC, dual tasking, anticipatory balance, forward banding activities while monitoring dizziness; bed mobility, reactive balance   ? Consulted and Agree with Plan of Care Patient;Family member/caregiver   ? Family Member Consulted wife   ? ?  ?  ? ?  ? ? ?Patient will benefit from skilled therapeutic intervention in order to improve the following deficits and impairments:  Abnormal gait, Decreased coordination, Difficulty walking, Impaired tone, Decreased safety awareness, Decreased endurance, Decreased activity tolerance, Pain, Decreased balance, Decreased knowledge of use of DME, Impaired flexibility, Improper body mechanics, Postural dysfunction, Decreased strength ? ?Visit Diagnosis: ?Other symptoms and signs involving the nervous system ? ?Unsteadiness on feet ? ?Other abnormalities of gait and mobility ? ?Muscle weakness (generalized) ? ? ? ? ?Problem List ?Patient Active Problem List  ? Diagnosis Date Noted  ? Aortic aneurysm ([place]) 03/26/2021  ? Abnormal gait 03/13/2020  ? Bronchitis 03/13/2020  ? Chin laceration 03/13/2020  ? Cough 03/13/2020  ? Cramp and spasm 03/13/2020  ? Disorder of penis 03/13/2020  ? Dizzy spells 03/13/2020  ? Dyspnea 03/13/2020  ? Edema of lower extremity 03/13/2020  ? Elevated PSA 03/13/2020  ? Encounter for general adult medical examination without abnormal findings 03/13/2020  ? Hearing loss in left ear 03/13/2020  ? History of malignant neoplasm of prostate 03/13/2020  ? Impacted cerumen 03/13/2020  ? Low back pain 03/13/2020  ? Malignant tumor of prostate ([place]) 03/13/2020  ? Neuropathy 03/13/2020  ? Orthostatic hypotension 03/13/2020  ? Other long term (current) drug therapy 03/13/2020  ? Other specified abnormal findings of blood chemistry 03/13/2020  ? Other sprain of right hip, initial encounter 03/13/2020  ? Paresthesia 03/13/2020  ? Parkinson's disease ([place]) 03/13/2020  ? Paroxysmal  atrial fibrillation ([place]) 03/13/2020  ? Polyneuropathy due to type 2 diabetes mellitus ([place]) 03/13/2020  ? Presence of aortocoronary bypass graft 03/13/2020  ? Sciatica, right side 03/13/2020  ? Shoulder joint pain 03/0

## 2021-05-21 ENCOUNTER — Ambulatory Visit: Payer: Medicare Other | Admitting: Physical Therapy

## 2021-05-21 ENCOUNTER — Ambulatory Visit: Payer: Medicare Other | Admitting: Occupational Therapy

## 2021-05-21 ENCOUNTER — Encounter: Payer: Self-pay | Admitting: Physical Therapy

## 2021-05-21 DIAGNOSIS — R29818 Other symptoms and signs involving the nervous system: Secondary | ICD-10-CM | POA: Diagnosis not present

## 2021-05-21 DIAGNOSIS — R251 Tremor, unspecified: Secondary | ICD-10-CM | POA: Diagnosis not present

## 2021-05-21 DIAGNOSIS — M6281 Muscle weakness (generalized): Secondary | ICD-10-CM | POA: Diagnosis not present

## 2021-05-21 DIAGNOSIS — R2689 Other abnormalities of gait and mobility: Secondary | ICD-10-CM

## 2021-05-21 DIAGNOSIS — R278 Other lack of coordination: Secondary | ICD-10-CM

## 2021-05-21 DIAGNOSIS — Z9181 History of falling: Secondary | ICD-10-CM | POA: Diagnosis not present

## 2021-05-21 DIAGNOSIS — R2681 Unsteadiness on feet: Secondary | ICD-10-CM

## 2021-05-21 DIAGNOSIS — R41840 Attention and concentration deficit: Secondary | ICD-10-CM

## 2021-05-21 NOTE — Therapy (Signed)
?OUTPATIENT OCCUPATIONAL THERAPY TREATMENT NOTE ? ? ?Patient Name: Johnny Fox ?MRN: [id number] ?DOB:08-27-1928, 88 y.o., male ?Today's Date: 05/21/2021 ? ?PCP: Josetta Huddle, MD ?REFERRING PROVIDER: Ludwig Clarks, DO ? ? OT End of Session - 05/21/21 0930   ? ? Visit Number 13   ? Number of Visits 17   ? Date for OT Re-Evaluation 06/14/21   ? Authorization Type Medicare / BCBS supplemental   ? OT Start Time 0930   ? OT Stop Time 1012   ? OT Time Calculation (min) 42 min   ? Activity Tolerance Patient tolerated treatment well   ? Behavior During Therapy [facility] for tasks assessed/performed   ? ?  ?  ? ?  ? ? ? ?Past Medical History:  ?Diagnosis Date  ? Actinic keratoses   ? and seborrheic keratoses  ? Arrhythmia   ? afib postoperatively after CABG in 2004, No recurrence  ? Coronary artery disease   ? Diabetes mellitus without complication ([place])   ? diet controlled  ? Dyslipidemia   ? ED (erectile dysfunction)   ? Elevated PSA   ? 2010, workup with Dr. Risa Grill  ? H/O prostate biopsy   ? Hypertension   ? Onychomycosis   ? Prostate cancer ([place])   ? PSA's run approx 7- 2011-2013  ? Right shoulder injury   ? july 2014, Dr. Lennette Bihari Supple  ? Tick bite of abdomen   ? 2013, treated with 3wks of doxycycline, tick engorged 3.69m nodule left thyroid 4/14- seen on carotid u/s  ? ?Past Surgical History:  ?Procedure Laterality Date  ? CARDIAC SURGERY    ? CATARACT EXTRACTION, BILATERAL    ? CORONARY ARTERY BYPASS GRAFT  2004  ? ?Patient Active Problem List  ? Diagnosis Date Noted  ? Aortic aneurysm ([place] 03/26/2021  ? Abnormal gait 03/13/2020  ? Bronchitis 03/13/2020  ? Chin laceration 03/13/2020  ? Cough 03/13/2020  ? Cramp and spasm 03/13/2020  ? Disorder of penis 03/13/2020  ? Dizzy spells 03/13/2020  ? Dyspnea 03/13/2020  ? Edema of lower extremity 03/13/2020  ? Elevated PSA 03/13/2020  ? Encounter for general adult medical examination without abnormal findings 03/13/2020  ? Hearing loss in left ear 03/13/2020  ? History of  malignant neoplasm of prostate 03/13/2020  ? Impacted cerumen 03/13/2020  ? Low back pain 03/13/2020  ? Malignant [place] of [place] 03/13/2020  ? Neuropathy 03/13/2020  ? Orthostatic hypotension 03/13/2020  ? Other long term (current) drug therapy 03/13/2020  ? Other specified abnormal findings of blood chemistry 03/13/2020  ? Other sprain of right hip, initial encounter 03/13/2020  ? Paresthesia 03/13/2020  ? Parkinson's disease ([place] 03/13/2020  ? Paroxysmal atrial fibrillation ([place] 03/13/2020  ? Polyneuropathy due to type 2 diabetes mellitus ([place] 03/13/2020  ? Presence of aortocoronary bypass graft 03/13/2020  ? Sciatica, right side 03/13/2020  ? Shoulder joint pain 03/13/2020  ? Skin sensation disturbance 03/13/2020  ? Type 2 diabetes mellitus without complications ([place] [id number] ? Upper respiratory infection 03/13/2020  ? Vitamin D deficiency 03/13/2020  ? Asymmetrical left sensorineural hearing loss 01/13/2017  ? Right leg numbness 01/14/2015  ? Thyroid nodule 01/14/2015  ? Coronary atherosclerosis of native coronary artery 02/15/2013  ? Essential hypertension, benign 02/15/2013  ? Pure hypercholesterolemia 02/15/2013  ? Fatigue 02/15/2013  ? ? ?ONSET DATE: [place] date of dx, May 2021 ? ?REFERRING DIAG: Parkinson's Disease ? ?THERAPY DIAG:  ?Muscle weakness (generalized) ? ?Tremor ? ?Attention and concentration deficit ? ?Other lack  of coordination ? ?Other symptoms and signs involving the nervous system ? ?Unsteadiness on feet ? ? ?PERTINENT HISTORY: ?ET/PD diagnosed approx May 2021; PMH includes repeated falls, CAD, HTN, afib s/p CABG in 2004, hx of prostate cancer, DMT2, and chronic R shoulder pain ? ?PRECAUTIONS: Fall; hearing loss ? ?SUBJECTIVE: ?"I'm tired. I didn't save anything for you!" ? ?PAIN:  ?Are you having pain? No ? ? ?OBJECTIVE:  ? ?TODAY'S TREATMENT - 05/21/21: ?Reviewed buttons: Pt fastened/unfastened 4 buttons in 1:09.88.  Reviewed education on technique with recommendation for pt  to pull fabric and push button, especially when unbuttoning.  Completed task again after blocked practice with improved time of 45.47 seconds with new technique.  Educated on use of button hook with therapist providing verbal and demonstration cues, followed by blocked practice.  Fastened/unfastened 4 buttons in 44.87 seconds with use of button hook.  Pt reports increased difficulty with top button.  Discussed attempting to fasten button in front of mirror to allow for visual feedback due to location of button.   ?Engaged in functional reach with reaching forward for rings and then reaching outside BOS to place on target.  Increased challenge with crossing midline to challenge weight shifting and balance.  Therapist providing intermittent cues for large amplitude reaching, weight shifting, and trunk rotation during reaching and placing of rings on target.   ? ? ? ?PATIENT EDUCATION: ?Education details: Continued condition-specific education, answering pt questions prn ?Person educated: Patient ?Education method: Explanation, Demonstration, Verbal cues, and Handouts ?Education comprehension: verbalized understanding and returned demonstration ? ? ?[place] Code: [id number] and [id number] ? ? OT Short Term Goals              Target Date: 05/07/21  ? ? STG #1  ? Title Pt will demonstrate independence w/ condition-specific HEP   ? Baseline No HEP at this time   ? Status MET - 05/07/21  ?  ? STG #2  ? Title Pt will verbalize understanding of strategies to decrease risk of further complication related to PD, including corresponding energy conservation strategies to improve participation in IADLs   ? Baseline Decreased knowledge of condition-specific education   ? Status MET - 04/30/21  ?  ? STG #3  ? Title Pt will demonstrate independence w/ recommended AE to improve independence w/ self-care/grooming tasks (long handled sponge, reacher, adaptive comb, etc)   ? Baseline Decreased knowledge of AE   ? Status  On-going  ? ?  ?  ? ? OT Long Term Goals               Target Date: 06/06/21  ? ? LTG #1  ? Title Pt will improve participation in self-feeding as evidenced by decreasing time to move 5 beans from a bowl w/ a spoon, incorporating compensatory strategies/AE prn   ? Baseline 15.62 sec   ? Status On-going   ?  ? LTG #2  ? Title Pt will demonstrate increased ease w/ manipulation of clothing fasteners by decreasing time to complete 4 buttons by at least 5 sec   ? Baseline 44.56 sec to button 4 shirt buttons   ? Status On-going  ?  ? LTG #3  ? Title Pt will demonstrate independence w/ at least 1 memory compensatory strategy by d/c   ? Baseline Reports difficulty w/ short-term memory (e.g., names, conversations)   ? Status On-going  ?  ? LTG #4  ? Title Pt will be able to reach forward 6" at  moderate range to engage in IADLs with improved ease and without increased pain.   ? Baseline 5" on functional reach test   ? Status On-going  ?  ? ? Plan - 04/23/21 1022   ? ? Clinical Impression Statement Pt fatigued at beginning of session, therefore engaged in seated tasks primarily and then incorporated standing.  Pt continues to demonstrate difficulty with [facility] tasks, especially with buttons.  Therapist educated pt on use of button hook with pt demonstrating initial improvements, however when timed pt still requiring same amount of time as without buttonhook.  Pt demonstrating good standing balance this session with good ability to engage in forward reach and trunk rotation with crossing midline.  Pt with no c/o pain in shoulder with reaching tasks this session.  ? OT Occupational Profile and History Detailed Assessment- Review of Records and additional review of physical, cognitive, psychosocial history related to current functional performance   ? Occupational performance deficits (Please refer to evaluation for details): ADL's;IADL's;Rest and Sleep;Leisure   ? Body Structure / Function / Physical Skills  ADL;Dexterity;ROM;Strength;Balance;Coordination;FMC;IADL;Body mechanics;Endurance;UE functional use;Decreased knowledge of use of DME;GMC;Mobility   ? Cognitive Skills Memory   ? Psychosocial Skills Environmental  Adaptations

## 2021-05-21 NOTE — Therapy (Signed)
[place] [facility] ?[address], [address] ?[place], [place], [address] ?Phone: [phone]   Fax:  [phone] ? ?Physical Therapy Treatment ? ?Patient Details  ?Name: Johnny Fox ?MRN: [id number] ?Date of Birth: 1928/09/15 ?Referring Provider (PT): Tat, Eustace Quail, DO ? ? ?Encounter Date: 05/21/2021 ? ? PT End of Session - 05/21/21 0932   ? ? Visit Number 13   ? Number of Visits 17   ? Date for PT Re-Evaluation 06/04/21   ? Authorization Type Medicare/BCBS   ? PT Start Time [phone]   ? PT Stop Time 0930   ? PT Time Calculation (min) 46 min   ? Equipment Utilized During Treatment Gait belt   ? Activity Tolerance Patient tolerated treatment well;Patient limited by fatigue   ? Behavior During Therapy [facility] for tasks assessed/performed   ? ?  ?  ? ?  ? ? ?Past Medical History:  ?Diagnosis Date  ? Actinic keratoses   ? and seborrheic keratoses  ? Arrhythmia   ? afib postoperatively after CABG in 2004, No recurrence  ? Coronary artery disease   ? Diabetes mellitus without complication ([place])   ? diet controlled  ? Dyslipidemia   ? ED (erectile dysfunction)   ? Elevated PSA   ? 2010, workup with Dr. Risa Grill  ? H/O prostate biopsy   ? Hypertension   ? Onychomycosis   ? Prostate cancer ([place])   ? PSA's run approx 7- 2011-2013  ? Right shoulder injury   ? july 2014, Dr. Lennette Bihari Supple  ? Tick bite of abdomen   ? 2013, treated with 3wks of doxycycline, tick engorged 3.45m nodule left thyroid 4/14- seen on carotid u/s  ? ? ?Past Surgical History:  ?Procedure Laterality Date  ? CARDIAC SURGERY    ? CATARACT EXTRACTION, BILATERAL    ? CORONARY ARTERY BYPASS GRAFT  2004  ? ? ?There were no vitals filed for this visit. ? ? Subjective Assessment - 05/21/21 0845   ? ? Subjective Feeling tired. "Don't feel like I got enough sleep last night."   ? Pertinent History CAD, DM, HLD, HTN, prostate CA, CABG 2004; pt reports a bad R shoulder and would like to limit shoulder movements during PT tx   ? Diagnostic  tests none recent   ? Patient Stated Goals improve balance   ? Currently in Pain? No/denies   ? ?  ?  ? ?  ? ? ? ? ? ? ? ? ? ? ? ? ? ? ? ? ? ? ? ? [place] - 05/21/21 0001   ? ?  ? Ambulation/Gait  ? Curb 5: Supervision;Other (comment)   ? Curb Details (indicate cue type and reason) [facility] when stepping up/down 4x   ?  ? Therapeutic Activites   ? Therapeutic Activities Other Therapeutic Activities   ? Other Therapeutic Activities car transfers in patient's SUV- cueing to sit bottom in first and then swing legs in, boost bottom back/forward before moving LEs, maintaining wider BOS and wt shifting forward upon standing   ?  ? Neuro Re-ed   ? Neuro Re-ed Details  in II bars: ant/pos and side to side weight shifts without UE movements and instead using light UE support on bars- heavy cueing for coordination of movement, correcting bradykinesia, and encouraging larger amplitude movements with upright posture. also educatient pt's spouse on these movements for improved ability to perform at home. standing on foam + perturbations to  dowel rod- LOB forward and using II bar to correct but with improved use of hip strategy after practice   ?  ? Lumbar Exercises: Aerobic  ? Nustep L5x5 min UEs/LEs (cues for pacing to maintain ~ 70-75 SPM to avoid fatigue)   ? ?  ?  ? ?  ? ? ? ? ? ? ? ? ? ? PT Education - 05/21/21 0931   ? ? Education Details update to HEP-Access Code [email or web address]; educated patient and wife on proper performance of these new exercises; also advised patient to speak to his PCP about dizziness   ? Person(s) Educated Patient;Spouse   ? Methods Explanation;Demonstration;Tactile cues;Verbal cues;Handout   ? Comprehension Verbalized understanding;Returned demonstration   ? ?  ?  ? ?  ? ? ? PT Short Term Goals - 05/12/21 1111   ? ?  ? PT SHORT TERM GOAL #1  ? Title Patient to be independent with initial HEP.   ? Time 3   ? Period Weeks   ? Status Achieved   ? Target Date 04/30/21    ? ?  ?  ? ?  ? ? ? ? PT Long Term Goals - 05/19/21 0928   ? ?  ? PT LONG TERM GOAL #1  ? Title Patient to be independent with advanced HEP.   ? Time 8   ? Period Weeks   ? Status Partially Met   met for current  ? Target Date 06/04/21   ?  ? PT LONG TERM GOAL #2  ? Title Patient to improve MiniBestTest score to atleast 22 to decrease risk of falls.   ? Time 8   ? Period Weeks   ? Status Revised   19  ? Target Date 06/04/21   ?  ? PT LONG TERM GOAL #3  ? Title Patient to demonstrate <10% increase between TUG and TUG cognitive to improve ability to dual task in home/community.   ? Baseline 81.3% increase on TUG cog   ? Time 8   ? Period Weeks   ? Status On-going   32.1% increase in TUG cognitive  ? Target Date 06/04/21   ?  ? PT LONG TERM GOAL #4  ? Title Patient to demonstrate gait speed of 2.62 ft/sec in order to score as community ambulator.   ? Baseline 2.56 ft/sec   ? Time 8   ? Period Weeks   ? Status Achieved   2.81 ft/sec  ? Target Date 06/04/21   ?  ? PT LONG TERM GOAL #5  ? Title Patient to verbalize understanding of fall prevention in home environment information.   ? Time 8   ? Period Weeks   ? Status Achieved   ? Target Date 06/04/21   ?  ? PT LONG TERM GOAL #6  ? Title Patient to demonstrate M-CTSIB condition 4 for 15 sec without LOB.   ? Time 3   ? Period Weeks   ? Status On-going   ? Target Date 06/04/21   ? ?  ?  ? ?  ? ? ? ? ? ? ? ? Plan - 05/21/21 0932   ? ? Clinical Impression Statement Patient arrived to session with report of some fatigue but no other complaints. Worked on car transfers into patient?s SUV, providing cues on scooting bottom, maintaining wider BOS, and shifting weight forward. Patient was able to demonstrate improved ease and safety after practice. Proceeded with weight shifts with UE support today, which allowed patient to  demonstrate larger and more confident movements. Updated these exercises into HEP. Patient still reporting dizziness after short periods of standing  activities; resolved after brief sitting rest break. Encouraged patient to speak to his PCP about these concerns. Perturbation training on foam revealed anterior LOB with good improvement in hip strategy after edu and practice. Patient tolerated session well and without complaints at end of session.   ? Comorbidities CAD, DM, HLD, HTN, prostate CA, CABG 2004   ? PT Treatment/Interventions ADLs/Self Care Home Management;Canalith Repostioning;Cryotherapy;Electrical Stimulation;DME Instruction;Moist Heat;Gait training;Stair training;Functional mobility training;Therapeutic activities;Therapeutic exercise;Balance training;Neuromuscular re-education;Manual techniques;Patient/family education;Passive range of motion;Dry needling;Energy conservation;Vestibular;Taping   ? PT Next Visit Plan activities on compliant surface with EC, dual tasking, anticipatory balance, forward banding activities while monitoring dizziness; bed mobility, reactive balance   ? Consulted and Agree with Plan of Care Patient;Family member/caregiver   ? Family Member Consulted wife   ? ?  ?  ? ?  ? ? ?Patient will benefit from skilled therapeutic intervention in order to improve the following deficits and impairments:  Abnormal gait, Decreased coordination, Difficulty walking, Impaired tone, Decreased safety awareness, Decreased endurance, Decreased activity tolerance, Pain, Decreased balance, Decreased knowledge of use of DME, Impaired flexibility, Improper body mechanics, Postural dysfunction, Decreased strength ? ?Visit Diagnosis: ?Other symptoms and signs involving the nervous system ? ?Unsteadiness on feet ? ?Other abnormalities of gait and mobility ? ? ? ? ?Problem List ?Patient Active Problem List  ? Diagnosis Date Noted  ? Aortic aneurysm ([place]) 03/26/2021  ? Abnormal gait 03/13/2020  ? Bronchitis 03/13/2020  ? Chin laceration 03/13/2020  ? Cough 03/13/2020  ? Cramp and spasm 03/13/2020  ? Disorder of penis 03/13/2020  ? Dizzy spells  03/13/2020  ? Dyspnea 03/13/2020  ? Edema of lower extremity 03/13/2020  ? Elevated PSA 03/13/2020  ? Encounter for general adult medical examination without abnormal findings 03/13/2020  ? Hearing loss in left ear 03/0

## 2021-05-22 DIAGNOSIS — R600 Localized edema: Secondary | ICD-10-CM | POA: Diagnosis not present

## 2021-05-22 DIAGNOSIS — R011 Cardiac murmur, unspecified: Secondary | ICD-10-CM | POA: Diagnosis not present

## 2021-05-22 DIAGNOSIS — R42 Dizziness and giddiness: Secondary | ICD-10-CM | POA: Diagnosis not present

## 2021-05-26 ENCOUNTER — Ambulatory Visit: Payer: Medicare Other | Admitting: Occupational Therapy

## 2021-05-26 ENCOUNTER — Ambulatory Visit: Payer: Medicare Other | Admitting: Physical Therapy

## 2021-05-26 ENCOUNTER — Encounter: Payer: Self-pay | Admitting: Physical Therapy

## 2021-05-26 DIAGNOSIS — R2689 Other abnormalities of gait and mobility: Secondary | ICD-10-CM | POA: Diagnosis not present

## 2021-05-26 DIAGNOSIS — R29818 Other symptoms and signs involving the nervous system: Secondary | ICD-10-CM | POA: Diagnosis not present

## 2021-05-26 DIAGNOSIS — R2681 Unsteadiness on feet: Secondary | ICD-10-CM

## 2021-05-26 DIAGNOSIS — R251 Tremor, unspecified: Secondary | ICD-10-CM | POA: Diagnosis not present

## 2021-05-26 DIAGNOSIS — M6281 Muscle weakness (generalized): Secondary | ICD-10-CM

## 2021-05-26 DIAGNOSIS — R278 Other lack of coordination: Secondary | ICD-10-CM

## 2021-05-26 DIAGNOSIS — Z9181 History of falling: Secondary | ICD-10-CM | POA: Diagnosis not present

## 2021-05-26 NOTE — Therapy (Signed)
[facility] [address], [place], [place], [address] Phone: [phone]   Fax:  [phone]  Physical Therapy Treatment  Patient Details  Name: Johnny Fox MRN: [id number] Date of Birth: 12/06/1928 Referring Provider (PT): Tat, Eustace Quail, DO   Rationale for Evaluation and Treatment Rehabilitation   Encounter Date: 05/26/2021   PT End of Session - 05/26/21 0931     Visit Number 14    Number of Visits 17    Date for PT Re-Evaluation 06/04/21    Authorization Type Medicare/BCBS    PT Start Time 0848    PT Stop Time 0929    PT Time Calculation (min) 41 min    Equipment Utilized During Treatment Gait belt    Activity Tolerance Patient tolerated treatment well    Behavior During Therapy [facility] for tasks assessed/performed             Past Medical History:  Diagnosis Date   Actinic keratoses    and seborrheic keratoses   Arrhythmia    afib postoperatively after CABG in 2004, No recurrence   Coronary artery disease    Diabetes mellitus without complication ([place])    diet controlled   Dyslipidemia    ED (erectile dysfunction)    Elevated PSA    2010, workup with Dr. Risa Grill   H/O prostate biopsy    Hypertension    Onychomycosis    Prostate cancer ([place])    PSA's run approx 7- 2011-2013   Right shoulder injury    july 2014, Dr. Lennette Bihari Supple   Tick bite of abdomen    2013, treated with 3wks of doxycycline, tick engorged 3.4mm nodule left thyroid 4/14- seen on carotid u/s    Past Surgical History:  Procedure Laterality Date   [place], BILATERAL     CORONARY ARTERY BYPASS GRAFT  2004    There were no vitals filed for this visit.   Subjective Assessment - 05/26/21 0852     Subjective Moving slow today.    Patient is accompained by: Family member    Pertinent History CAD, DM, HLD, HTN, prostate CA, CABG 2004; pt reports a bad R shoulder and would like to limit shoulder movements  during PT tx    Diagnostic tests none recent    Patient Stated Goals improve balance    Currently in Pain? No/denies                               [facility] Adult PT Treatment/Exercise - 05/26/21 0001       Neuro Re-ed    Neuro Re-ed Details  resisted backwards step with green TB around ankle 10x slow, 10x quick on each foot with CGA;  components of grapevine, then full grapevine with verbal and manual cues and CGA 10x, standing on foam + perturbations while encouraging appropriate hip strategy; review of ant/pos and lateral wt shifts in II bars with corrective cueing      Lumbar Exercises: Aerobic   Nustep L5x5 min UEs/LEs (cues for pacing to maintain ~ 70-75 SPM to avoid fatigue)      Lumbar Exercises: Supine   Bridge 10 reps    Bridge Limitations --    Other Supine Lumbar Exercises bridge + alt march 5x   verbal cueing for sequencing of movement  PT Education - 05/26/21 0931     Education Details discussion on remaining POC and benefits of continued fitness after PT DC    Person(s) Educated Patient    Methods Explanation    Comprehension Verbalized understanding              PT Short Term Goals - 05/12/21 1111       PT SHORT TERM GOAL #1   Title Patient to be independent with initial HEP.    Time 3    Period Weeks    Status Achieved    Target Date 04/30/21               PT Long Term Goals - 05/19/21 0928       PT LONG TERM GOAL #1   Title Patient to be independent with advanced HEP.    Time 8    Period Weeks    Status Partially Met   met for current   Target Date 06/04/21      PT LONG TERM GOAL #2   Title Patient to improve MiniBestTest score to atleast 22 to decrease risk of falls.    Time 8    Period Weeks    Status Revised   19   Target Date 06/04/21      PT LONG TERM GOAL #3   Title Patient to demonstrate <10% increase between TUG and TUG cognitive to improve ability to dual task in  home/community.    Baseline 81.3% increase on TUG cog    Time 8    Period Weeks    Status On-going   32.1% increase in TUG cognitive   Target Date 06/04/21      PT LONG TERM GOAL #4   Title Patient to demonstrate gait speed of 2.62 ft/sec in order to score as community ambulator.    Baseline 2.56 ft/sec    Time 8    Period Weeks    Status Achieved   2.81 ft/sec   Target Date 06/04/21      PT LONG TERM GOAL #5   Title Patient to verbalize understanding of fall prevention in home environment information.    Time 8    Period Weeks    Status Achieved    Target Date 06/04/21      PT LONG TERM GOAL #6   Title Patient to demonstrate M-CTSIB condition 4 for 15 sec without LOB.    Time 3    Period Weeks    Status On-going    Target Date 06/04/21                   Plan - 05/26/21 0931     Clinical Impression Statement Patient participated in PT session focusing on dynamic balance activities. Performed resisted stepping with focus on reactive balance in posterior direction. More trouble to maintain stability with slow controlled compared to quick stepping. Patient demonstrated improved coordination of LE moment with practice separating movements into components. End of session focused on core and hip stability work. Patient tolerated session well and with decreased fatigue today.    Comorbidities CAD, DM, HLD, HTN, prostate CA, CABG 2004    PT Treatment/Interventions ADLs/Self Care Home Management;Canalith Repostioning;Cryotherapy;Electrical Stimulation;DME Instruction;Moist Heat;Gait training;Stair training;Functional mobility training;Therapeutic activities;Therapeutic exercise;Balance training;Neuromuscular re-education;Manual techniques;Patient/family education;Passive range of motion;Dry needling;Energy conservation;Vestibular;Taping    PT Next Visit Plan activities on compliant surface with EC, dual tasking, anticipatory balance, forward banding activities while monitoring  dizziness; bed mobility, reactive balance  Consulted and Agree with Plan of Care Patient;Family member/caregiver    Family Member Consulted wife             Patient will benefit from skilled therapeutic intervention in order to improve the following deficits and impairments:  Abnormal gait, Decreased coordination, Difficulty walking, Impaired tone, Decreased safety awareness, Decreased endurance, Decreased activity tolerance, Pain, Decreased balance, Decreased knowledge of use of DME, Impaired flexibility, Improper body mechanics, Postural dysfunction, Decreased strength  Visit Diagnosis: Other symptoms and signs involving the nervous system  Unsteadiness on feet  Other abnormalities of gait and mobility     Problem List Patient Active Problem List   Diagnosis Date Noted   Aortic aneurysm ([place]) 03/26/2021   Abnormal gait 03/13/2020   Bronchitis 03/13/2020   Chin laceration 03/13/2020   Cough 03/13/2020   Cramp and spasm 03/13/2020   Disorder of penis 03/13/2020   Dizzy spells 03/13/2020   Dyspnea 03/13/2020   Edema of lower extremity 03/13/2020   Elevated PSA 03/13/2020   Encounter for general adult medical examination without abnormal findings 03/13/2020   Hearing loss in left ear 03/13/2020   History of malignant neoplasm of prostate 03/13/2020   Impacted cerumen 03/13/2020   Low back pain 03/13/2020   Malignant tumor of prostate ([place]) 03/13/2020   Neuropathy 03/13/2020   Orthostatic hypotension 03/13/2020   Other long term (current) drug therapy 03/13/2020   Other specified abnormal findings of blood chemistry 03/13/2020   Other sprain of right hip, initial encounter 03/13/2020   Paresthesia 03/13/2020   Parkinson's disease ([place]) 03/13/2020   Paroxysmal atrial fibrillation ([place]) 03/13/2020   Polyneuropathy due to type 2 diabetes mellitus ([place]) 03/13/2020   Presence of aortocoronary bypass graft 03/13/2020   Sciatica, right side 03/13/2020   Shoulder joint  pain 03/13/2020   Skin sensation disturbance 03/13/2020   Type 2 diabetes mellitus without complications ([place]) 05/15/209   Upper respiratory infection 03/13/2020   Vitamin D deficiency 03/13/2020   Asymmetrical left sensorineural hearing loss 01/13/2017   Right leg numbness 01/14/2015   Thyroid nodule 01/14/2015   Coronary atherosclerosis of native coronary artery 02/15/2013   Essential hypertension, benign 02/15/2013   Pure hypercholesterolemia 02/15/2013   Fatigue 02/15/2013    Janene Harvey, PT, DPT 05/26/21 9:33 AM   [facility] [address] [place], [place], [address] Phone: [phone]   Fax:  [phone]  Name: Johnny Fox MRN: [id number] Date of Birth: [birth year]

## 2021-05-26 NOTE — Therapy (Signed)
OUTPATIENT OCCUPATIONAL THERAPY TREATMENT NOTE   Patient Name: Johnny Fox MRN: [id number] DOB:May 28, 1928, 88 y.o., male Today's Date: 05/26/2021  PCP: Josetta Huddle, MD REFERRING PROVIDER: Tat, Eustace Quail, DO   OT End of Session - 05/26/21 0926     Visit Number 14    Number of Visits 17    Date for OT Re-Evaluation 06/14/21    Authorization Type Medicare / BCBS supplemental    OT Start Time 0930    OT Stop Time 1012    OT Time Calculation (min) 42 min    Activity Tolerance Patient tolerated treatment well    Behavior During Therapy WFL for tasks assessed/performed              Past Medical History:  Diagnosis Date   Actinic keratoses    and seborrheic keratoses   Arrhythmia    afib postoperatively after CABG in 2004, No recurrence   Coronary artery disease    Diabetes mellitus without complication ([place])    diet controlled   Dyslipidemia    ED (erectile dysfunction)    Elevated PSA    2010, workup with Dr. Risa Grill   H/O prostate biopsy    Hypertension    Onychomycosis    Prostate cancer ([place])    PSA's run approx 7- 2011-2013   Right shoulder injury    july 2014, Dr. Lennette Bihari Supple   Tick bite of abdomen    2013, treated with 3wks of doxycycline, tick engorged 3.42mm nodule left thyroid 4/14- seen on carotid u/s   Past Surgical History:  Procedure Laterality Date   [place], BILATERAL     CORONARY ARTERY BYPASS GRAFT  2004   Patient Active Problem List   Diagnosis Date Noted   Aortic aneurysm ([place]) 03/26/2021   Abnormal gait 03/13/2020   Bronchitis 03/13/2020   Chin laceration 03/13/2020   Cough 03/13/2020   Cramp and spasm 03/13/2020   Disorder of penis 03/13/2020   Dizzy spells 03/13/2020   Dyspnea 03/13/2020   Edema of lower extremity 03/13/2020   Elevated PSA 03/13/2020   Encounter for general adult medical examination without abnormal findings 03/13/2020   Hearing loss in left ear 03/13/2020   History of  malignant neoplasm of prostate 03/13/2020   Impacted cerumen 03/13/2020   Low back pain 03/13/2020   Malignant tumor of prostate ([place]) 03/13/2020   Neuropathy 03/13/2020   Orthostatic hypotension 03/13/2020   Other long term (current) drug therapy 03/13/2020   Other specified abnormal findings of blood chemistry 03/13/2020   Other sprain of right hip, initial encounter 03/13/2020   Paresthesia 03/13/2020   Parkinson's disease ([place]) 03/13/2020   Paroxysmal atrial fibrillation ([place]) 03/13/2020   Polyneuropathy due to type 2 diabetes mellitus ([place]) 03/13/2020   Presence of aortocoronary bypass graft 03/13/2020   Sciatica, right side 03/13/2020   Shoulder joint pain 03/13/2020   Skin sensation disturbance 03/13/2020   Type 2 diabetes mellitus without complications ([place]) 19/75/8832   Upper respiratory infection 03/13/2020   Vitamin D deficiency 03/13/2020   Asymmetrical left sensorineural hearing loss 01/13/2017   Right leg numbness 01/14/2015   Thyroid nodule 01/14/2015   Coronary atherosclerosis of native coronary artery 02/15/2013   Essential hypertension, benign 02/15/2013   Pure hypercholesterolemia 02/15/2013   Fatigue 02/15/2013    ONSET DATE: [facility] date of dx, May 2021  REFERRING DIAG: Parkinson's Disease  THERAPY DIAG:  Muscle weakness (generalized)  Other symptoms and signs involving the nervous system  Other lack of coordination  Unsteadiness on feet  Tremor   PERTINENT HISTORY: ET/PD diagnosed approx May 2021; PMH includes repeated falls, CAD, HTN, afib s/p CABG in 2004, hx of prostate cancer, DMT2, and chronic R shoulder pain  PRECAUTIONS: Fall; hearing loss  SUBJECTIVE: "We celebrated our 62nd wedding anniversary this weekend"  PAIN:  Are you having pain? No   OBJECTIVE:   TODAY'S TREATMENT - 05/26/21: Engaged in discussion of progress towards goals and upcoming scheduled d/c.  Pt reports noticing improvements in his balance and overall mobility,  however this continues to be an area of concern of his.  Pt requesting for exercises to be culled to ~5 exercises to ensure that he continues to complete them post d/c.  Completed 1 set of 10 each exercise listed below with intermittent tactile cues for shoulder exercises and good recall of large amplitude exercises. Hand AROM Composite Flexion   Thumb Opposition   Seated Finger Flicks with Elbow Extension   Open Book Chest Stretch on Towel Roll   Supine PNF D2   Supine Lower Trunk Rotation    Engaged in functional reach with reaching across midline and outside BOS for resistive clothespins (4# and 6#) and then reaching forward to challenge forward reach to place on target.  Increased challenge with crossing midline to challenge weight shifting and balance.  Therapist providing intermittent cues for large amplitude reaching, weight shifting, and trunk rotation during reaching lateral and forward to place clothespins on target.    PATIENT EDUCATION: Education details: Continued condition-specific education, answering pt questions prn Person educated: Patient Education method: Explanation, Demonstration, Verbal cues, and Handouts Education comprehension: verbalized understanding and returned demonstration   [place] Code: [id number]   OT Short Term Goals              Target Date: 05/07/21    STG #1   Title Pt will demonstrate independence w/ condition-specific HEP    Baseline No HEP at this time    Status MET - 05/07/21     STG #2   Title Pt will verbalize understanding of strategies to decrease risk of further complication related to PD, including corresponding energy conservation strategies to improve participation in IADLs    Baseline Decreased knowledge of condition-specific education    Status MET - 04/30/21     STG #3   Title Pt will demonstrate independence w/ recommended AE to improve independence w/ self-care/grooming tasks (long handled sponge, reacher,  adaptive comb, etc)    Baseline Decreased knowledge of AE    Status On-going         OT Long Term Goals               Target Date: 06/06/21    LTG #1   Title Pt will improve participation in self-feeding as evidenced by decreasing time to move 5 beans from a bowl w/ a spoon, incorporating compensatory strategies/AE prn    Baseline 15.62 sec    Status On-going      LTG #2   Title Pt will demonstrate increased ease w/ manipulation of clothing fasteners by decreasing time to complete 4 buttons by at least 5 sec    Baseline 44.56 sec to button 4 shirt buttons    Status On-going     LTG #3   Title Pt will demonstrate independence w/ at least 1 memory compensatory strategy by d/c    Baseline Reports difficulty w/ short-term memory (e.g., names, conversations)    Status  On-going     LTG #4   Title Pt will be able to reach forward 6" at moderate range to engage in IADLs with improved ease and without increased pain.    Baseline 5" on functional reach test    Status On-going      Plan - 04/23/21 1022     Clinical Impression Statement Engaged in discussion of progress towards goals and upcoming scheduled d/c.  Pt reports noticing improvements in his balance and overall mobility, however this continues to be an area of concern of his.  Pt requesting for exercises to be culled to ~5 exercises to ensure that he continues to complete them post d/c.  Reviewed supine shoulder exercises with pt benefiting from intermittent tactile cues for improved technique and shoulder stretch to tolerance.  Pt demonstrating improvements in wrist extension/flexion and supination/pronation but continues to demonstrate decreased hand and finger opening.  Therapist instructed pt in overhead reaching and reaching to side during large amplitude movements, limited by pain in R shoulder.    OT Occupational Profile and History Detailed Assessment- Review of Records and additional review of physical, cognitive, psychosocial  history related to current functional performance    Occupational performance deficits (Please refer to evaluation for details): ADL's;IADL's;Rest and Sleep;Leisure    Body Structure / Function / Physical Skills ADL;Dexterity;ROM;Strength;Balance;Coordination;FMC;IADL;Body mechanics;Endurance;UE functional use;Decreased knowledge of use of DME;GMC;Mobility    Cognitive Skills Memory    Psychosocial Skills Environmental  Adaptations    Rehab Potential Good    Clinical Decision Making Several treatment options, min-mod task modification necessary    Comorbidities Affecting Occupational Performance: May have comorbidities impacting occupational performance    OT Frequency 2x / week    OT Duration 8 weeks    OT Treatment/Interventions Self-care/ADL training;Therapeutic exercise;DME and/or AE instruction;Functional Mobility Training;Cognitive remediation/compensation;Balance training;Aquatic Therapy;Neuromuscular education;Manual Therapy;Visual/perceptual remediation/compensation;Energy conservation;Therapeutic activities;Patient/family education    Plan Review buttons; continue w/ large amplitude movement, functional reach, trunk rotation   Consulted and Agree with Plan of Care Patient;Family member/caregiver    Family Member Consulted Wife [name])      Simonne Come, MS, OTR/L 05/26/2021, 9:26 AM

## 2021-05-28 ENCOUNTER — Ambulatory Visit: Payer: Medicare Other | Admitting: Physical Therapy

## 2021-05-28 ENCOUNTER — Ambulatory Visit: Payer: Medicare Other | Admitting: Occupational Therapy

## 2021-05-28 ENCOUNTER — Encounter: Payer: Self-pay | Admitting: Physical Therapy

## 2021-05-28 DIAGNOSIS — R2681 Unsteadiness on feet: Secondary | ICD-10-CM

## 2021-05-28 DIAGNOSIS — R251 Tremor, unspecified: Secondary | ICD-10-CM | POA: Diagnosis not present

## 2021-05-28 DIAGNOSIS — R2689 Other abnormalities of gait and mobility: Secondary | ICD-10-CM | POA: Diagnosis not present

## 2021-05-28 DIAGNOSIS — Z9181 History of falling: Secondary | ICD-10-CM | POA: Diagnosis not present

## 2021-05-28 DIAGNOSIS — R29818 Other symptoms and signs involving the nervous system: Secondary | ICD-10-CM | POA: Diagnosis not present

## 2021-05-28 DIAGNOSIS — R278 Other lack of coordination: Secondary | ICD-10-CM

## 2021-05-28 DIAGNOSIS — M6281 Muscle weakness (generalized): Secondary | ICD-10-CM | POA: Diagnosis not present

## 2021-05-28 NOTE — Patient Instructions (Signed)
Access Code: [email or web address] URL: [email or web address] Date: 05/28/2021 Prepared by: [facility]  Exercises - Sit to Stand with Arms Crossed  - 1 x daily - 5 x weekly - 2 sets - 10 reps - Heel Raises with Counter Support  - 1 x daily - 5 x weekly - 2 sets - 10 reps - Standing Balance in Corner  - 1 x daily - 5 x weekly - 2-3 sets - 30 sec hold - Standing Toe Taps  - 1 x daily - 5 x weekly - 2 sets - 10 reps - Staggered Stance Step Throughs  - 1 x daily - 5 x weekly - 2 sets - 10 reps - Lateral Weight Shift with Parallel Bars (BKA)  - 1 x daily - 5 x weekly - 2 sets - 10 reps

## 2021-05-28 NOTE — Therapy (Signed)
OUTPATIENT OCCUPATIONAL THERAPY TREATMENT NOTE   Patient Name: Johnny Fox MRN: [id number] DOB:1928-04-26, 88 y.o., male Today's Date: 05/28/2021  PCP: Josetta Huddle, MD REFERRING PROVIDER: Tat, Eustace Quail, DO   OT End of Session - 05/28/21 0928     Visit Number 15    Number of Visits 17    Date for OT Re-Evaluation 06/14/21    Authorization Type Medicare / BCBS supplemental    OT Start Time 0930    OT Stop Time 1012    OT Time Calculation (min) 42 min    Activity Tolerance Patient tolerated treatment well    Behavior During Therapy WFL for tasks assessed/performed               Past Medical History:  Diagnosis Date   Actinic keratoses    and seborrheic keratoses   Arrhythmia    afib postoperatively after CABG in 2004, No recurrence   Coronary artery disease    Diabetes mellitus without complication ([place])    diet controlled   Dyslipidemia    ED (erectile dysfunction)    Elevated PSA    2010, workup with Dr. Risa Grill   H/O prostate biopsy    Hypertension    Onychomycosis    Prostate cancer ([place])    PSA's run approx 7- 2011-2013   Right shoulder injury    july 2014, Dr. Lennette Bihari Supple   Tick bite of abdomen    2013, treated with 3wks of doxycycline, tick engorged 3.37m nodule left thyroid 4/14- seen on carotid u/s   Past Surgical History:  Procedure Laterality Date   [place] BILATERAL     CORONARY ARTERY BYPASS GRAFT  2004   Patient Active Problem List   Diagnosis Date Noted   Aortic aneurysm ([place] 03/26/2021   Abnormal gait 03/13/2020   Bronchitis 03/13/2020   Chin laceration 03/13/2020   Cough 03/13/2020   Cramp and spasm 03/13/2020   Disorder of penis 03/13/2020   Dizzy spells 03/13/2020   Dyspnea 03/13/2020   Edema of lower extremity 03/13/2020   Elevated PSA 03/13/2020   Encounter for general adult medical examination without abnormal findings 03/13/2020   Hearing loss in left ear 03/13/2020   History of  malignant neoplasm of prostate 03/13/2020   Impacted cerumen 03/13/2020   Low back pain 03/13/2020   Malignant tumor of prostate ([place] 03/13/2020   Neuropathy 03/13/2020   Orthostatic hypotension 03/13/2020   Other long term (current) drug therapy 03/13/2020   Other specified abnormal findings of blood chemistry 03/13/2020   Other sprain of right hip, initial encounter 03/13/2020   Paresthesia 03/13/2020   Parkinson's disease ([place] 03/13/2020   Paroxysmal atrial fibrillation ([place] 03/13/2020   Polyneuropathy due to type 2 diabetes mellitus ([place] 03/13/2020   Presence of aortocoronary bypass graft 03/13/2020   Sciatica, right side 03/13/2020   Shoulder joint pain 03/13/2020   Skin sensation disturbance 03/13/2020   Type 2 diabetes mellitus without complications ([place] 033/82/5053  Upper respiratory infection 03/13/2020   Vitamin D deficiency 03/13/2020   Asymmetrical left sensorineural hearing loss 01/13/2017   Right leg numbness 01/14/2015   Thyroid nodule 01/14/2015   Coronary atherosclerosis of native coronary artery 02/15/2013   Essential hypertension, benign 02/15/2013   Pure hypercholesterolemia 02/15/2013   Fatigue 02/15/2013    ONSET DATE: Appox date of dx, May 2021  REFERRING DIAG: Parkinson's Disease  THERAPY DIAG:  Muscle weakness (generalized)  Other lack of coordination  Unsteadiness on feet  Other symptoms and signs involving the nervous system   PERTINENT HISTORY: ET/PD diagnosed approx May 2021; PMH includes repeated falls, CAD, HTN, afib s/p CABG in 2004, hx of prostate cancer, DMT2, and chronic R shoulder pain  PRECAUTIONS: Fall; hearing loss  SUBJECTIVE: "My fingers aren't working well this morning"  PAIN:  Are you having pain? No   OBJECTIVE:   TODAY'S TREATMENT - 05/28/21: Small Peg Board with RUE and LUE with one at a time progressing to in hand manipulation and translation from palm to finger tips for placing pegs into board. Pt copied  pattern with 100% accuracy, demonstrating mild increased time for coordination and manipulation of pegs, dropping only 2 pegs with translation. Pt removed with one at a time progressing to in hand manipulation and translation.  Pt demonstrating increased difficulty with removing pegs from peg board.  Buttons: Massed practice with buttoning and unbuttoning shirt. Reiterated previous education on large amplitude movements and cues for technique with unbuttoning.  Pt completed 3 button with good success with initial 2 buttons, requiring increased time for 3rd button requiring increased time and effort, completing in 50.38 seconds. Completed 2nd trial after discussion of technique.  Pt buttoned/unbuttoned 4 buttons in 50.25 seconds.  Reviewed unbuttoning technique and pt completed finger flicks prior to 3rd and final trial with pt then able to complete 4 button/unbutton in 42.85.     PATIENT EDUCATION: Education details: Continued condition-specific education, answering pt questions prn Person educated: Patient Education method: Explanation, Demonstration, Verbal cues, and Handouts Education comprehension: verbalized understanding and returned demonstration   [place] Code: [id number]   OT Short Term Goals              Target Date: 05/07/21    STG #1   Title Pt will demonstrate independence w/ condition-specific HEP    Baseline No HEP at this time    Status MET - 05/07/21     STG #2   Title Pt will verbalize understanding of strategies to decrease risk of further complication related to PD, including corresponding energy conservation strategies to improve participation in IADLs    Baseline Decreased knowledge of condition-specific education    Status MET - 04/30/21     STG #3   Title Pt will demonstrate independence w/ recommended AE to improve independence w/ self-care/grooming tasks (long handled sponge, reacher, adaptive comb, etc)    Baseline Decreased knowledge of AE     Status On-going         OT Long Term Goals               Target Date: 06/06/21    LTG #1   Title Pt will improve participation in self-feeding as evidenced by decreasing time to move 5 beans from a bowl w/ a spoon, incorporating compensatory strategies/AE prn    Baseline 15.62 sec    Status On-going      LTG #2   Title Pt will demonstrate increased ease w/ manipulation of clothing fasteners by decreasing time to complete 4 buttons by at least 5 sec    Baseline 44.56 sec to button 4 shirt buttons    Status On-going     LTG #3   Title Pt will demonstrate independence w/ at least 1 memory compensatory strategy by d/c    Baseline Reports difficulty w/ short-term memory (e.g., names, conversations)    Status On-going     LTG #4   Title Pt will be able to reach forward 6" at  moderate range to engage in IADLs with improved ease and without increased pain.    Baseline 5" on functional reach test    Status On-going      Plan - 04/23/21 1022     Clinical Impression Statement Pt continues to demonstrate decreased coordination with peg board activity and managing buttons.  Pt demonstrates decreased recall and carryover of previous education to increase success with fastening and unfastening buttons.  Pt benefits from repetition with activities to increase motor control and coordination.  Therapist encouraged pt to utilize finger flicks and hand opening prior to engaging in [facility] tasks to increase success.    OT Occupational Profile and History Detailed Assessment- Review of Records and additional review of physical, cognitive, psychosocial history related to current functional performance    Occupational performance deficits (Please refer to evaluation for details): ADL's;IADL's;Rest and Sleep;Leisure    Body Structure / Function / Physical Skills ADL;Dexterity;ROM;Strength;Balance;Coordination;FMC;IADL;Body mechanics;Endurance;UE functional use;Decreased knowledge of use of DME;GMC;Mobility     Cognitive Skills Memory    Psychosocial Skills Environmental  Adaptations    Rehab Potential Good    Clinical Decision Making Several treatment options, min-mod task modification necessary    Comorbidities Affecting Occupational Performance: May have comorbidities impacting occupational performance    OT Frequency 2x / week    OT Duration 8 weeks    OT Treatment/Interventions Self-care/ADL training;Therapeutic exercise;DME and/or AE instruction;Functional Mobility Training;Cognitive remediation/compensation;Balance training;Aquatic Therapy;Neuromuscular education;Manual Therapy;Visual/perceptual remediation/compensation;Energy conservation;Therapeutic activities;Patient/family education    Plan [facility] tasks (still demonstrates minimal carryover and recall of education on buttons), continue w/ large amplitude movement, functional reach, trunk rotation   Consulted and Agree with Plan of Care Patient;Family member/caregiver    Family Member Consulted Wife [name])      Simonne Come, MS, OTR/L 05/28/2021, 9:28 AM

## 2021-05-28 NOTE — Therapy (Signed)
[facility] [address], [place], [place], [address] Phone: [phone]   Fax:  [phone]  Physical Therapy Treatment  Patient Details  Name: Johnny Fox MRN: [id number] Date of Birth: [birth year] Referring Provider (PT): Tat, Eustace Quail, DO  Rationale for Evaluation and Treatment Rehabilitation   Encounter Date: 05/28/2021   PT End of Session - 05/28/21 0928     Visit Number 15    Number of Visits 17    Date for PT Re-Evaluation 06/04/21    Authorization Type Medicare/BCBS    PT Start Time 4982    PT Stop Time 0925    PT Time Calculation (min) 38 min    Equipment Utilized During Treatment Gait belt    Activity Tolerance Patient tolerated treatment well    Behavior During Therapy [facility] for tasks assessed/performed             Past Medical History:  Diagnosis Date   Actinic keratoses    and seborrheic keratoses   Arrhythmia    afib postoperatively after CABG in 2004, No recurrence   Coronary artery disease    Diabetes mellitus without complication ([place])    diet controlled   Dyslipidemia    ED (erectile dysfunction)    Elevated PSA    2010, workup with Dr. Risa Grill   H/O prostate biopsy    Hypertension    Onychomycosis    Prostate cancer ([place])    PSA's run approx 7- 2011-2013   Right shoulder injury    july 2014, Dr. Lennette Bihari Supple   Tick bite of abdomen    2013, treated with 3wks of doxycycline, tick engorged 3.57mm nodule left thyroid 4/14- seen on carotid u/s    Past Surgical History:  Procedure Laterality Date   [place], BILATERAL     CORONARY ARTERY BYPASS GRAFT  2004    There were no vitals filed for this visit.   Subjective Assessment - 05/28/21 0849     Subjective Doing fine.    Pertinent History CAD, DM, HLD, HTN, prostate CA, CABG 2004; pt reports a bad R shoulder and would like to limit shoulder movements during PT tx    Diagnostic tests none recent     Patient Stated Goals improve balance    Currently in Pain? No/denies                               [facility] Adult PT Treatment/Exercise - 05/28/21 0001       Neuro Re-ed    Neuro Re-ed Details  standing on foam with EO/EC 30"- moderate sway; standing head turns to targets 30"- c/o very minimal dizziness; STS without UEs 5x, with EC 5x, holding blue medball 5x; standing heel raises with 2 finger support 10x; several trials of SLS, holding ~3 sec max; multidirectional toe tap to 3 cones      Lumbar Exercises: Aerobic   Nustep L5x5 min UEs/LEs (cues for pacing to maintain ~ 70-75 SPM to avoid fatigue)                     PT Education - 05/28/21 0927     Education Details review and consolidation of HEP, discussed slow progression of recumbent cycling to address cardiovascular fitness and endurance    Person(s) Educated Patient    Methods Explanation;Demonstration;Tactile cues;Verbal cues;Handout    Comprehension Verbalized understanding;Returned demonstration  PT Short Term Goals - 05/12/21 1111       PT SHORT TERM GOAL #1   Title Patient to be independent with initial HEP.    Time 3    Period Weeks    Status Achieved    Target Date 04/30/21               PT Long Term Goals - 05/19/21 0928       PT LONG TERM GOAL #1   Title Patient to be independent with advanced HEP.    Time 8    Period Weeks    Status Partially Met   met for current   Target Date 06/04/21      PT LONG TERM GOAL #2   Title Patient to improve MiniBestTest score to atleast 22 to decrease risk of falls.    Time 8    Period Weeks    Status Revised   19   Target Date 06/04/21      PT LONG TERM GOAL #3   Title Patient to demonstrate <10% increase between TUG and TUG cognitive to improve ability to dual task in home/community.    Baseline 81.3% increase on TUG cog    Time 8    Period Weeks    Status On-going   32.1% increase in TUG cognitive   Target  Date 06/04/21      PT LONG TERM GOAL #4   Title Patient to demonstrate gait speed of 2.62 ft/sec in order to score as community ambulator.    Baseline 2.56 ft/sec    Time 8    Period Weeks    Status Achieved   2.81 ft/sec   Target Date 06/04/21      PT LONG TERM GOAL #5   Title Patient to verbalize understanding of fall prevention in home environment information.    Time 8    Period Weeks    Status Achieved    Target Date 06/04/21      PT LONG TERM GOAL #6   Title Patient to demonstrate M-CTSIB condition 4 for 15 sec without LOB.    Time 3    Period Weeks    Status On-going    Target Date 06/04/21                   Plan - 05/28/21 8235     Clinical Impression Statement Patient arrived to session without complaints. Worked on consolidating HEP for max benefit after D/C. Patient still demonstrates sway on foam and with EC. Better tolerance for head turns to targets reported today. Still demonstrating difficulty with SLS, thus provided alternative activity to address this with more success. Patient reported understanding of HEP update and education on progression of cycling on recumbent bike to address endurance. Patient without complaints at end of session.    Comorbidities CAD, DM, HLD, HTN, prostate CA, CABG 2004    PT Treatment/Interventions ADLs/Self Care Home Management;Canalith Repostioning;Cryotherapy;Electrical Stimulation;DME Instruction;Moist Heat;Gait training;Stair training;Functional mobility training;Therapeutic activities;Therapeutic exercise;Balance training;Neuromuscular re-education;Manual techniques;Patient/family education;Passive range of motion;Dry needling;Energy conservation;Vestibular;Taping    PT Next Visit Plan activities on compliant surface with EC, dual tasking, anticipatory balance, forward banding activities while monitoring dizziness; bed mobility, reactive balance    Consulted and Agree with Plan of Care Patient;Family member/caregiver     Family Member Consulted wife             Patient will benefit from skilled therapeutic intervention in order to improve the following deficits and impairments:  Abnormal gait, Decreased coordination, Difficulty walking, Impaired tone, Decreased safety awareness, Decreased endurance, Decreased activity tolerance, Pain, Decreased balance, Decreased knowledge of use of DME, Impaired flexibility, Improper body mechanics, Postural dysfunction, Decreased strength  Visit Diagnosis: Other symptoms and signs involving the nervous system  Unsteadiness on feet  Other abnormalities of gait and mobility     Problem List Patient Active Problem List   Diagnosis Date Noted   Aortic aneurysm ([place]) 03/26/2021   Abnormal gait 03/13/2020   Bronchitis 03/13/2020   Chin laceration 03/13/2020   Cough 03/13/2020   Cramp and spasm 03/13/2020   Disorder of penis 03/13/2020   Dizzy spells 03/13/2020   Dyspnea 03/13/2020   Edema of lower extremity 03/13/2020   Elevated PSA 03/13/2020   Encounter for general adult medical examination without abnormal findings 03/13/2020   Hearing loss in left ear 03/13/2020   History of malignant neoplasm of prostate 03/13/2020   Impacted cerumen 03/13/2020   Low back pain 03/13/2020   Malignant tumor of prostate ([place]) 03/13/2020   Neuropathy 03/13/2020   Orthostatic hypotension 03/13/2020   Other long term (current) drug therapy 03/13/2020   Other specified abnormal findings of blood chemistry 03/13/2020   Other sprain of right hip, initial encounter 03/13/2020   Paresthesia 03/13/2020   Parkinson's disease ([place]) 03/13/2020   Paroxysmal atrial fibrillation ([place]) 03/13/2020   Polyneuropathy due to type 2 diabetes mellitus ([place]) 03/13/2020   Presence of aortocoronary bypass graft 03/13/2020   Sciatica, right side 03/13/2020   Shoulder joint pain 03/13/2020   Skin sensation disturbance 03/13/2020   Type 2 diabetes mellitus without complications ([place]) 53/96/7289    Upper respiratory infection 03/13/2020   Vitamin D deficiency 03/13/2020   Asymmetrical left sensorineural hearing loss 01/13/2017   Right leg numbness 01/14/2015   Thyroid nodule 01/14/2015   Coronary atherosclerosis of native coronary artery 02/15/2013   Essential hypertension, benign 02/15/2013   Pure hypercholesterolemia 02/15/2013   Fatigue 02/15/2013     Janene Harvey, PT, DPT 05/28/21 12:28 PM   [facility] [address], [place], [place], [address] Phone: [phone]   Fax:  [phone]  Name: Johnny Fox MRN: [id number] Date of Birth: 1929-01-03

## 2021-06-04 ENCOUNTER — Encounter: Payer: Self-pay | Admitting: Occupational Therapy

## 2021-06-04 ENCOUNTER — Ambulatory Visit: Payer: Medicare Other | Admitting: Occupational Therapy

## 2021-06-04 ENCOUNTER — Encounter: Payer: Self-pay | Admitting: Physical Therapy

## 2021-06-04 ENCOUNTER — Telehealth: Payer: Self-pay | Admitting: Physical Therapy

## 2021-06-04 ENCOUNTER — Ambulatory Visit: Payer: Medicare Other | Admitting: Physical Therapy

## 2021-06-04 DIAGNOSIS — Z9181 History of falling: Secondary | ICD-10-CM | POA: Diagnosis not present

## 2021-06-04 DIAGNOSIS — M6281 Muscle weakness (generalized): Secondary | ICD-10-CM

## 2021-06-04 DIAGNOSIS — R2689 Other abnormalities of gait and mobility: Secondary | ICD-10-CM | POA: Diagnosis not present

## 2021-06-04 DIAGNOSIS — R251 Tremor, unspecified: Secondary | ICD-10-CM

## 2021-06-04 DIAGNOSIS — R29818 Other symptoms and signs involving the nervous system: Secondary | ICD-10-CM

## 2021-06-04 DIAGNOSIS — R42 Dizziness and giddiness: Secondary | ICD-10-CM | POA: Diagnosis not present

## 2021-06-04 DIAGNOSIS — R600 Localized edema: Secondary | ICD-10-CM | POA: Diagnosis not present

## 2021-06-04 DIAGNOSIS — R011 Cardiac murmur, unspecified: Secondary | ICD-10-CM | POA: Diagnosis not present

## 2021-06-04 DIAGNOSIS — R2681 Unsteadiness on feet: Secondary | ICD-10-CM | POA: Diagnosis not present

## 2021-06-04 NOTE — Therapy (Signed)
OUTPATIENT OCCUPATIONAL THERAPY TREATMENT NOTE & DISCHARGE SUMMARY   Patient Name: Johnny Fox MRN: [id number] DOB:July 30, 1928, 88 y.o., male Today's Date: 06/04/2021  PCP: Josetta Huddle, MD REFERRING PROVIDER: Tat, Eustace Quail, DO   OT End of Session - 06/04/21 0939     Visit Number 16    Number of Visits 17    Date for OT Re-Evaluation 06/14/21    Authorization Type Medicare / BCBS supplemental    OT Start Time 0930    OT Stop Time 1015    OT Time Calculation (min) 45 min    Activity Tolerance Patient tolerated treatment well    Behavior During Therapy WFL for tasks assessed/performed            OCCUPATIONAL THERAPY DISCHARGE SUMMARY  Visits from Start of Care: 16  Current functional level related to goals / functional outcomes: Pt reports he is currently at least Mod I w/ most ADLs; verbalizes and/or demonstrates understanding of learned strategies; increased distance w/ functional reach test; and decreased time to manipulate clothing fasteners   Remaining deficits: Persistent dizziness; fall risk; mild tremor (R more affected than L)   Education / Equipment: PD-specific education, including: PD-specific HEP, adaptive strategies for ADLs/IADLs, ways to prevent future complications, appropriate community resources, typical course of OP therapy re-evaluation/screen around 6-9 months post-d/c to assess for need for further therapy and/or functional changes due to progressive nature of diagnosis  Patient agrees to discharge. Patient goals were partially met. Patient is being discharged due to being pleased with the current functional level.    Past Medical History:  Diagnosis Date   Actinic keratoses    and seborrheic keratoses   Arrhythmia    afib postoperatively after CABG in 2004, No recurrence   Coronary artery disease    Diabetes mellitus without complication ([place])    diet controlled   Dyslipidemia    ED (erectile dysfunction)    Elevated PSA    2010,  workup with Dr. Risa Grill   H/O prostate biopsy    Hypertension    Onychomycosis    Prostate cancer ([place])    PSA's run approx 7- 2011-2013   Right shoulder injury    july 2014, Dr. Lennette Bihari Supple   Tick bite of abdomen    2013, treated with 3wks of doxycycline, tick engorged 3.86m nodule left thyroid 4/14- seen on carotid u/s   Past Surgical History:  Procedure Laterality Date   [place] BILATERAL     CORONARY ARTERY BYPASS GRAFT  2004   Patient Active Problem List   Diagnosis Date Noted   Aortic aneurysm ([place] 03/26/2021   Abnormal gait 03/13/2020   Bronchitis 03/13/2020   Chin laceration 03/13/2020   Cough 03/13/2020   Cramp and spasm 03/13/2020   Disorder of penis 03/13/2020   Dizzy spells 03/13/2020   Dyspnea 03/13/2020   Edema of lower extremity 03/13/2020   Elevated PSA 03/13/2020   Encounter for general adult medical examination without abnormal findings 03/13/2020   Hearing loss in left ear 03/13/2020   History of malignant neoplasm of prostate 03/13/2020   Impacted cerumen 03/13/2020   Low back pain 03/13/2020   Malignant tumor of prostate ([place] 03/13/2020   Neuropathy 03/13/2020   Orthostatic hypotension 03/13/2020   Other long term (current) drug therapy 03/13/2020   Other specified abnormal findings of blood chemistry 03/13/2020   Other sprain of right hip, initial encounter 03/13/2020   Paresthesia 03/13/2020   Parkinson's disease ([place]  03/13/2020   Paroxysmal atrial fibrillation ([place]) 03/13/2020   Polyneuropathy due to type 2 diabetes mellitus ([place]) 03/13/2020   Presence of aortocoronary bypass graft 03/13/2020   Sciatica, right side 03/13/2020   Shoulder joint pain 03/13/2020   Skin sensation disturbance 03/13/2020   Type 2 diabetes mellitus without complications ([place]) 42/68/3419   Upper respiratory infection 03/13/2020   Vitamin D deficiency 03/13/2020   Asymmetrical left sensorineural hearing loss 01/13/2017   Right leg  numbness 01/14/2015   Thyroid nodule 01/14/2015   Coronary atherosclerosis of native coronary artery 02/15/2013   Essential hypertension, benign 02/15/2013   Pure hypercholesterolemia 02/15/2013   Fatigue 02/15/2013    ONSET DATE: [place] date of dx, May 2021  REFERRING DIAG: Parkinson's Disease  THERAPY DIAG:  Tremor  Muscle weakness (generalized)  Other symptoms and signs involving the nervous system  History of falling   PERTINENT HISTORY: ET/PD diagnosed approx May 2021; PMH includes repeated falls, CAD, HTN, afib s/p CABG in 2004, hx of prostate cancer, DMT2, and chronic R shoulder pain  PRECAUTIONS: Fall; hearing loss  SUBJECTIVE:  SUBJECTIVE STATEMENT: Pt reports he is comfortable w/ what has been addressed in therapy; "thank you for all your help"  PAIN:  Are you having pain? No   OBJECTIVE:   TODAY'S TREATMENT - 06/04/21: Practiced self-feeding w/ weighted utensils, problem-solving w/ pt due to continued report of minimal success w/ previously administered built-up handles; OT provided pertinent education and handout for options for self-purchase was administered to pt as conclusion of session Education provided regarding recommendation of utilizing built-in shower seat during bathing, particularly when completed tasks w/ eyes closed, as a fall prevention strategy; pt verbalized understanding Reviewed memory compensatory strategies (memory notebook, use of a calendar/planner, basket or board by the door for reminders, sticky notes, use of alarms/timers) and discussed potential pt-specific examples as able   PATIENT EDUCATION: Continued condition-specific education, answering pt questions in preparation for d/c Person educated: Patient Education method: Explanation, Demonstration, Verbal cues, and Handouts Education comprehension: verbalized understanding and returned demonstration   [place] Code: [id number]   OT Short Term Goals               Target Date: 05/07/21    STG #1   Title Pt will demonstrate independence w/ condition-specific HEP    Baseline No HEP at this time    Status MET - 05/07/21     STG #2   Title Pt will verbalize understanding of strategies to decrease risk of further complication related to PD, including corresponding energy conservation strategies to improve participation in IADLs    Baseline Decreased knowledge of condition-specific education    Status MET - 04/30/21     STG #3   Title Pt will demonstrate independence w/ recommended AE to improve independence w/ self-care/grooming tasks (long handled sponge, reacher, adaptive comb, etc)    Baseline Decreased knowledge of AE    Status MET - 06/04/21         OT Long Term Goals               Target Date: 06/06/21    LTG #1   Title Pt will improve participation in self-feeding as evidenced by decreasing time to move 5 beans from a bowl w/ a spoon, incorporating compensatory strategies/AE prn    Baseline 15.62 sec    Status Not met - 18.4 sec; 12.6 sec     LTG #2   Title Pt will demonstrate increased ease w/  manipulation of clothing fasteners by decreasing time to complete 4 buttons by at least 5 sec   Baseline 44.56 sec to button 4 shirt buttons    Status MET - 06/04/21: 19.8 sec     LTG #3   Title Pt will demonstrate independence w/ at least 1 memory compensatory strategy by d/c    Baseline Reports difficulty w/ short-term memory (e.g., names, conversations)    Status MET - 06/04/21     LTG #4   Title Pt will be able to reach forward 6" at moderate range to engage in IADLs with improved ease and without increased pain.    Baseline 5" on functional reach test    Status MET - 06/04/21: 8.5"      Plan - 04/23/21 1022     Clinical Impression Statement Mr. Barris is a pleasant 88 y/o male who has been seen in OP OT to address concerns related to Parkinson's disease. Pt has shown improvements in all areas addressed in therapy w/ all STGs and 3/4  LTGs met. In preparation for pt-requested d/c this session, OT reassessed progress toward goals and reviewed previously discussed education, HEP, and adaptive strategies (including AE) w/ verbalizing and/or demonstrating good understanding of learned strategies. Pt is appropriate for d/c from skilled OT to HEP at this time, reports he is satisfied with progress, and is currently agreeable to discharge plan. Pt to schedule OT/PT screen within 6-9 months prior to leaving today.   OT Occupational Profile and History Detailed Assessment- Review of Records and additional review of physical, cognitive, psychosocial history related to current functional performance    Occupational performance deficits (Please refer to evaluation for details): ADL's;IADL's;Rest and Sleep;Leisure    Body Structure / Function / Physical Skills ADL;Dexterity;ROM;Strength;Balance;Coordination;FMC;IADL;Body mechanics;Endurance;UE functional use;Decreased knowledge of use of DME;GMC;Mobility    Cognitive Skills Memory    Psychosocial Skills Environmental  Adaptations    OT Frequency 2x / week    OT Duration 8 weeks    OT Treatment/Interventions Self-care/ADL training;Therapeutic exercise;DME and/or AE instruction;Functional Mobility Training;Cognitive remediation/compensation;Balance training;Aquatic Therapy;Neuromuscular education;Manual Therapy;Visual/perceptual remediation/compensation;Energy conservation;Therapeutic activities;Patient/family education    Plan D/C   Consulted and Agree with Plan of Care Patient;Family member/caregiver    Family Member Consulted Wife [name])      Kathrine Cords, MSOT, OTR/L 06/04/2021, 10:40 AM

## 2021-06-04 NOTE — Therapy (Signed)
[facility] [address], [place], [place], [address] Phone: [phone]   Fax:  [phone]  Physical Therapy Discharge Summary  Patient Details  Name: Johnny Fox MRN: [id number] Date of Birth: February 14, 1928 Referring Provider (PT): Tat, Eustace Quail, DO  Progress Note Reporting Period 05/14/21 to 06/04/21  See note below for Objective Data and Assessment of Progress/Goals.      Encounter Date: 06/04/2021   PT End of Session - 06/04/21 0930     Visit Number 16    Number of Visits 17    Date for PT Re-Evaluation 06/04/21    Authorization Type Medicare/BCBS    PT Start Time 0848    PT Stop Time 0928    PT Time Calculation (min) 40 min    Equipment Utilized During Treatment Gait belt    Activity Tolerance Patient tolerated treatment well    Behavior During Therapy WFL for tasks assessed/performed             Past Medical History:  Diagnosis Date   Actinic keratoses    and seborrheic keratoses   Arrhythmia    afib postoperatively after CABG in 2004, No recurrence   Coronary artery disease    Diabetes mellitus without complication ([place])    diet controlled   Dyslipidemia    ED (erectile dysfunction)    Elevated PSA    2010, workup with Dr. Risa Grill   H/O prostate biopsy    Hypertension    Onychomycosis    Prostate cancer ([place])    PSA's run approx 7- 2011-2013   Right shoulder injury    july 2014, Dr. Lennette Bihari Supple   Tick bite of abdomen    2013, treated with 3wks of doxycycline, tick engorged 3.69mm nodule left thyroid 4/14- seen on carotid u/s    Past Surgical History:  Procedure Laterality Date   [place], BILATERAL     CORONARY ARTERY BYPASS GRAFT  2004    There were no vitals filed for this visit.   Subjective Assessment - 06/04/21 0848     Subjective Doing okay.    Pertinent History CAD, DM, HLD, HTN, prostate CA, CABG 2004; pt reports a bad R shoulder and would  like to limit shoulder movements during PT tx    Diagnostic tests none recent    Patient Stated Goals improve balance    Currently in Pain? No/denies                [facility] PT Assessment - 06/04/21 0001       Assessment   Medical Diagnosis Parkinson's Disease    Referring Provider (PT) Tat, Eustace Quail, DO    Onset Date/Surgical Date 08/06/19      Mini-BESTest   Sit To Stand Normal: Comes to stand without use of hands and stabilizes independently.    Rise to Toes < 3 s.    Stand on one leg (left) Moderate: < 20 s    Stand on one leg (right) Moderate: < 20 s    Stand on one leg - lowest score 1    Compensatory Stepping Correction - Forward Moderate: More than one step is required to recover equilibrium    Compensatory Stepping Correction - Backward Moderate: More than one step is required to recover equilibrium    Compensatory Stepping Correction - Left Lateral Moderate: Several steps to recover equilibrium    Compensatory Stepping Correction - Right Lateral Moderate: Several steps  to recover equilibrium    Stepping Corredtion Lateral - lowest score 1    Stance - Feet together, eyes open, firm surface  Normal: 30s    Stance - Feet together, eyes closed, foam surface  Severe: Unable   posterior LOB requiring max A   Incline - Eyes Closed Moderate: Stands independently < 30s OR aligns with surface   almost completed 30 sec on 2nd trial   Change in Gait Speed Normal: Significantly changes walkling speed without imbalance    Walk with head turns - Horizontal Moderate: performs head turns with reduction in gait speed.    Walk with pivot turns Normal: Turns with feet close FAST (< 3 steps) with good balance.    Step over obstacles Moderate: Steps over box but touches box OR displays cautious behavior by slowing gait.    Timed UP & GO with Dual Task Moderate: Dual Task affects either counting OR walking (>10%) when compared to the TUG without Dual Task.    Mini-BEST total score 16       Timed Up and Go Test   Normal TUG (seconds) 12.45    Manual TUG (seconds) 12.49    Cognitive TUG (seconds) 14.32    TUG Comments 15% increase in TUG cog time                           [facility] Adult PT Treatment/Exercise - 06/04/21 0001       Lumbar Exercises: Aerobic   Nustep L5x5 min UEs/LEs (cues for pacing to maintain ~ 70-75 SPM to avoid fatigue)                     PT Education - 06/04/21 0930     Education Details review of HEP and today's measures; discussion on dizziness and role of vestibular system on dizziness and imbalance    Person(s) Educated Patient    Methods Explanation;Demonstration;Verbal cues;Tactile cues    Comprehension Verbalized understanding              PT Short Term Goals - 06/04/21 0931       PT SHORT TERM GOAL #1   Title Patient to be independent with initial HEP.    Time 3    Period Weeks    Status Achieved    Target Date 04/30/21               PT Long Term Goals - 06/04/21 0931       PT LONG TERM GOAL #1   Title Patient to be independent with advanced HEP.    Time 8    Period Weeks    Status Achieved    Target Date 06/04/21      PT LONG TERM GOAL #2   Title Patient to improve MiniBestTest score to atleast 22 to decrease risk of falls.    Time 8    Period Weeks    Status Not Met   16   Target Date 06/04/21      PT LONG TERM GOAL #3   Title Patient to demonstrate <10% increase between TUG and TUG cognitive to improve ability to dual task in home/community.    Baseline 81.3% increase on TUG cog    Time 8    Period Weeks    Status Partially Met   15% increase in TUG cognitive   Target Date 06/04/21      PT LONG TERM GOAL #4  Title Patient to demonstrate gait speed of 2.62 ft/sec in order to score as community ambulator.    Baseline 2.56 ft/sec    Time 8    Period Weeks    Status Achieved   2.81 ft/sec   Target Date 06/04/21      PT LONG TERM GOAL #5   Title Patient to verbalize  understanding of fall prevention in home environment information.    Time 8    Period Weeks    Status Achieved    Target Date 06/04/21      PT LONG TERM GOAL #6   Title Patient to demonstrate M-CTSIB condition 4 for 15 sec without LOB.    Time 3    Period Weeks    Status Not Met   severe sway with LOB   Target Date 06/04/21                   Plan - 06/04/21 0931     Clinical Impression Statement Patient arrived to session without new complaints. Patient has scored 16 on MiniBest test, indicating an increased risk of falls and sightly declined since last assessment. Slightly more difficulty evident with gait with head turns, gait on uneven surfaces, and reactive balance, suggesting slightly more difficulty using vestibular system for balance. Advised patient that he would likely benefit from PT for vertigo. However, patient reports only occasional dizziness day to day. Patient demonstrated improved ability to dual task as evidenced by score on TUG cognitive. Still demonstrating severe sway with M-CTSIB condition 4. At this time, patient reports understanding of HEP and denies questions. Patient has met or partially met most goals at this time and is ready for D/C with transition to HEP.    Comorbidities CAD, DM, HLD, HTN, prostate CA, CABG 2004    PT Treatment/Interventions ADLs/Self Care Home Management;Canalith Repostioning;Cryotherapy;Electrical Stimulation;DME Instruction;Moist Heat;Gait training;Stair training;Functional mobility training;Therapeutic activities;Therapeutic exercise;Balance training;Neuromuscular re-education;Manual techniques;Patient/family education;Passive range of motion;Dry needling;Energy conservation;Vestibular;Taping    PT Next Visit Plan DC at this time    Consulted and Agree with Plan of Care Patient    Family Member Consulted wife             Patient will benefit from skilled therapeutic intervention in order to improve the following deficits and  impairments:  Abnormal gait, Decreased coordination, Difficulty walking, Impaired tone, Decreased safety awareness, Decreased endurance, Decreased activity tolerance, Pain, Decreased balance, Decreased knowledge of use of DME, Impaired flexibility, Improper body mechanics, Postural dysfunction, Decreased strength  Visit Diagnosis: Other symptoms and signs involving the nervous system  Unsteadiness on feet  Other abnormalities of gait and mobility  Muscle weakness (generalized)     Problem List Patient Active Problem List   Diagnosis Date Noted   Aortic aneurysm ([place]) 03/26/2021   Abnormal gait 03/13/2020   Bronchitis 03/13/2020   Chin laceration 03/13/2020   Cough 03/13/2020   Cramp and spasm 03/13/2020   Disorder of penis 03/13/2020   Dizzy spells 03/13/2020   Dyspnea 03/13/2020   Edema of lower extremity 03/13/2020   Elevated PSA 03/13/2020   Encounter for general adult medical examination without abnormal findings 03/13/2020   Hearing loss in left ear 03/13/2020   History of malignant neoplasm of prostate 03/13/2020   Impacted cerumen 03/13/2020   Low back pain 03/13/2020   Malignant tumor of prostate ([place]) 03/13/2020   Neuropathy 03/13/2020   Orthostatic hypotension 03/13/2020   Other long term (current) drug therapy 03/13/2020   Other specified abnormal findings of  blood chemistry 03/13/2020   Other sprain of right hip, initial encounter 03/13/2020   Paresthesia 03/13/2020   Parkinson's disease ([place]) 03/13/2020   Paroxysmal atrial fibrillation ([place]) 03/13/2020   Polyneuropathy due to type 2 diabetes mellitus ([place]) 03/13/2020   Presence of aortocoronary bypass graft 03/13/2020   Sciatica, right side 03/13/2020   Shoulder joint pain 03/13/2020   Skin sensation disturbance 03/13/2020   Type 2 diabetes mellitus without complications ([place]) 27/80/0447   Upper respiratory infection 03/13/2020   Vitamin D deficiency 03/13/2020   Asymmetrical left sensorineural hearing  loss 01/13/2017   Right leg numbness 01/14/2015   Thyroid nodule 01/14/2015   Coronary atherosclerosis of native coronary artery 02/15/2013   Essential hypertension, benign 02/15/2013   Pure hypercholesterolemia 02/15/2013   Fatigue 02/15/2013    PHYSICAL THERAPY DISCHARGE SUMMARY  Visits from Start of Care: 16  Current functional level related to goals / functional outcomes: See above clinical impression   Remaining deficits: Difficulty with dual tasking, difficulty with balance on foam with EC   Education / Equipment: HEP  Plan: Patient agrees to discharge.  Patient goals were partially met. Patient is being discharged due to meeting the stated rehab goals.      Janene Harvey, PT, DPT 06/04/21 11:58 AM   [facility] [address], [place], [place], [address] Phone: [phone]   Fax:  [phone]  Name: Johnny Fox MRN: [id number] Date of Birth: April 23, 1928

## 2021-06-06 ENCOUNTER — Ambulatory Visit: Payer: Medicare Other | Admitting: Physical Therapy

## 2021-06-06 ENCOUNTER — Encounter: Payer: Medicare Other | Admitting: Occupational Therapy

## 2021-06-11 DIAGNOSIS — Z23 Encounter for immunization: Secondary | ICD-10-CM | POA: Diagnosis not present

## 2021-06-18 ENCOUNTER — Ambulatory Visit (INDEPENDENT_AMBULATORY_CARE_PROVIDER_SITE_OTHER): Payer: Medicare Other | Admitting: Podiatry

## 2021-06-18 ENCOUNTER — Encounter: Payer: Self-pay | Admitting: Podiatry

## 2021-06-18 DIAGNOSIS — M79676 Pain in unspecified toe(s): Secondary | ICD-10-CM

## 2021-06-18 DIAGNOSIS — E119 Type 2 diabetes mellitus without complications: Secondary | ICD-10-CM | POA: Diagnosis not present

## 2021-06-18 DIAGNOSIS — B351 Tinea unguium: Secondary | ICD-10-CM

## 2021-06-18 DIAGNOSIS — G629 Polyneuropathy, unspecified: Secondary | ICD-10-CM

## 2021-06-18 NOTE — Progress Notes (Signed)
This patient returns to my office for at risk foot care.  This patient requires this care by a professional since this patient will be at risk due to having  Diabetic neuropathy and Parkinsons.  This patient is unable to cut nails himself since the patient cannot reach his nails.These nails are painful walking and wearing shoes.  This patient presents for at risk foot care today.  General Appearance  Alert, conversant and in no acute stress.  Vascular  Dorsalis pedis and posterior tibial  pulses are palpable  bilaterally.  Capillary return is within normal limits  bilaterally. Temperature is within normal limits  bilaterally.  Neurologic  Senn-Weinstein monofilament wire diminished  bilaterally. Muscle power within normal limits bilaterally.  Nails Thick disfigured discolored nails with subungual debris  from hallux to fifth toes bilaterally. No evidence of bacterial infection or drainage bilaterally.  Orthopedic  No limitations of motion  feet .  No crepitus or effusions noted.  No bony pathology or digital deformities noted.  Skin  normotropic skin with no porokeratosis noted bilaterally.  No signs of infections or ulcers noted.     Onychomycosis  Pain in right toes  Pain in left toes  Consent was obtained for treatment procedures.   Mechanical debridement of nails 1-5  bilaterally performed with a nail nipper.  Filed with dremel without incident.    Return office visit   3 months                   Told patient to return for periodic foot care and evaluation due to potential at risk complications.   Avelynn Sellin DPM  

## 2021-06-20 DIAGNOSIS — I10 Essential (primary) hypertension: Secondary | ICD-10-CM | POA: Diagnosis not present

## 2021-06-20 DIAGNOSIS — R42 Dizziness and giddiness: Secondary | ICD-10-CM | POA: Diagnosis not present

## 2021-06-20 DIAGNOSIS — I951 Orthostatic hypotension: Secondary | ICD-10-CM | POA: Diagnosis not present

## 2021-06-20 DIAGNOSIS — G20 Parkinson's disease: Secondary | ICD-10-CM | POA: Diagnosis not present

## 2021-06-20 DIAGNOSIS — Z23 Encounter for immunization: Secondary | ICD-10-CM | POA: Diagnosis not present

## 2021-06-27 ENCOUNTER — Ambulatory Visit: Payer: Medicare Other | Admitting: Podiatry

## 2021-07-01 DIAGNOSIS — L814 Other melanin hyperpigmentation: Secondary | ICD-10-CM | POA: Diagnosis not present

## 2021-07-01 DIAGNOSIS — D485 Neoplasm of uncertain behavior of skin: Secondary | ICD-10-CM | POA: Diagnosis not present

## 2021-07-01 DIAGNOSIS — L989 Disorder of the skin and subcutaneous tissue, unspecified: Secondary | ICD-10-CM | POA: Diagnosis not present

## 2021-07-21 DIAGNOSIS — C61 Malignant neoplasm of prostate: Secondary | ICD-10-CM | POA: Diagnosis not present

## 2021-07-28 DIAGNOSIS — N401 Enlarged prostate with lower urinary tract symptoms: Secondary | ICD-10-CM | POA: Diagnosis not present

## 2021-07-28 DIAGNOSIS — R3915 Urgency of urination: Secondary | ICD-10-CM | POA: Diagnosis not present

## 2021-07-28 DIAGNOSIS — C61 Malignant neoplasm of prostate: Secondary | ICD-10-CM | POA: Diagnosis not present

## 2021-07-28 DIAGNOSIS — R3912 Poor urinary stream: Secondary | ICD-10-CM | POA: Diagnosis not present

## 2021-08-22 IMAGING — NM NM DATSCAN
4 series · 33 of 40 positions shown · non-contrast
Comparison: None.

CLINICAL DATA: 89-year-old male with RIGHT hand tremor for several
months. Memory impairment

EXAM:
NUCLEAR MEDICINE BRAIN IMAGING WITH SPECT  (DaTscan )
TECHNIQUE: SPECT images of the brain were obtained after intravenous injection
of radiopharmaceutical. 4 hour post injection imaging. Appropriate
positioning. 0.8 ml lugols solution administered in a.m
RADIOPHARMACEUTICALS:  5.07 millicuries I 123 Ioflupane

[Series 1: spect - (id) _(id)_cor · 4.1mm · 4.14mm/px · 5 of 128 frames shown]
[frame 11/128]
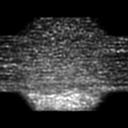
[frame 32/128]
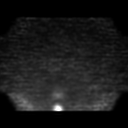
[frame 75/128]
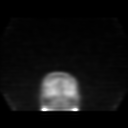
[frame 96/128]
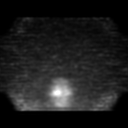
[frame 118/128]
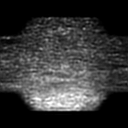

[Series 1: spect - (id) _(id) · 4.1mm · 4.14mm/px · 5 of 128 frames shown]
[frame 11/128]
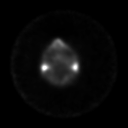
[frame 32/128]
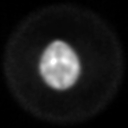
[frame 75/128]
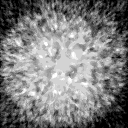
[frame 96/128]
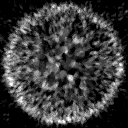
[frame 118/128]
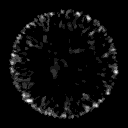

[Series 1: brain spect · 4.14mm/px · 5 of 120 frames shown]
[frame 11/120  full-range]
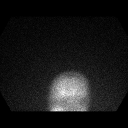
[frame 31/120  full-range]
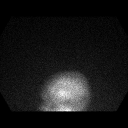
[frame 71/120  full-range]
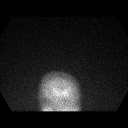
[frame 91/120  full-range]
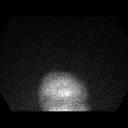
[frame 111/120  full-range]
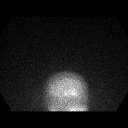

[Series 1013: mpr (id) range · 0.44mm/px · 18 of 38 slices shown]
[im 1/38]
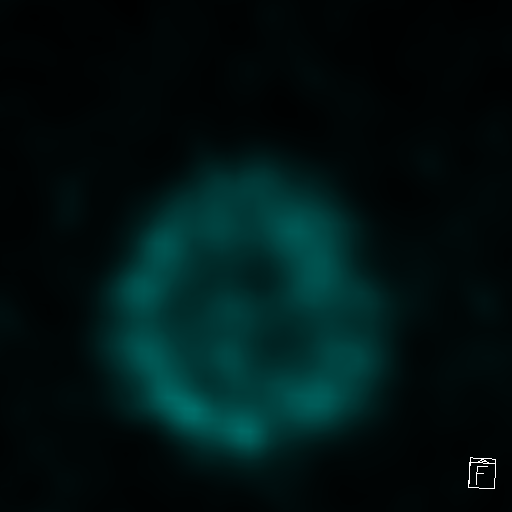
[im 4/38]
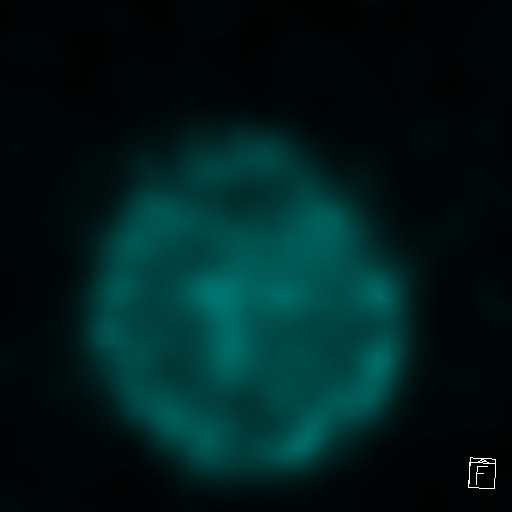
[im 6/38]
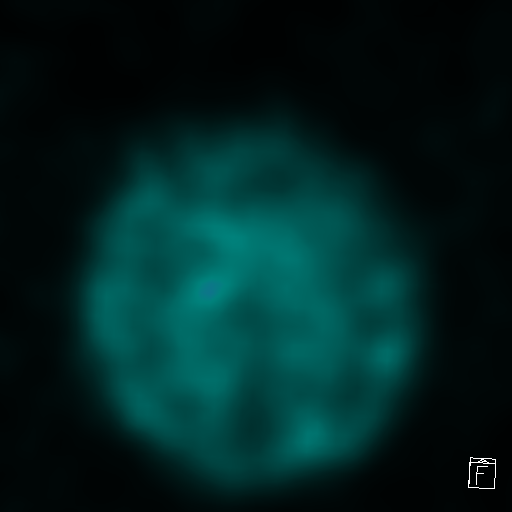
[im 8/38]
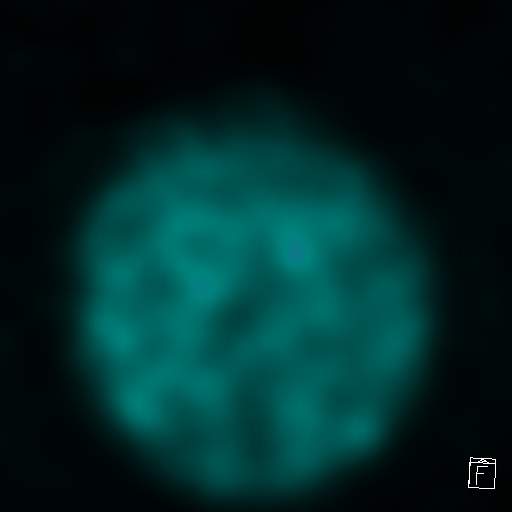
[im 9/38]
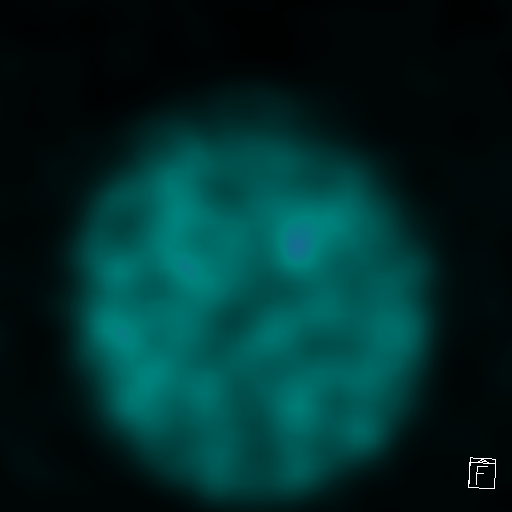
[im 11/38]
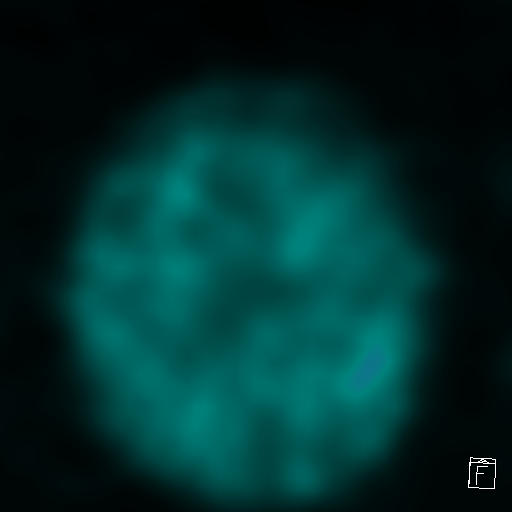
[im 15/38]
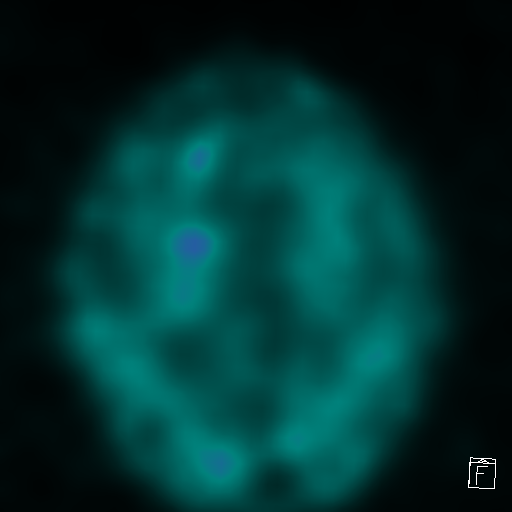
[im 16/38]
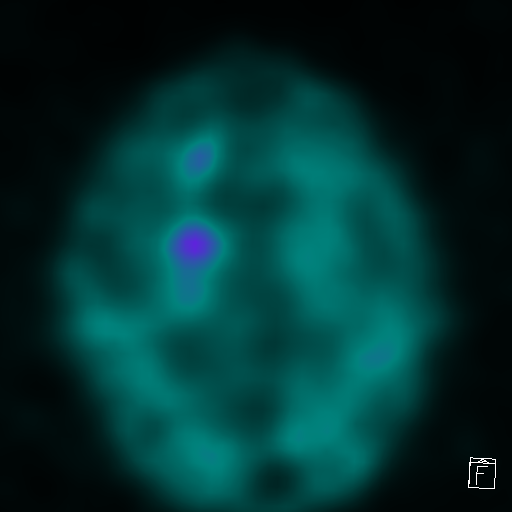
[im 18/38]
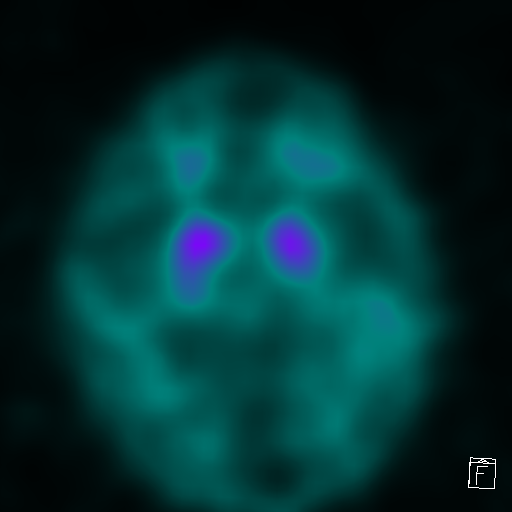
[im 20/38]
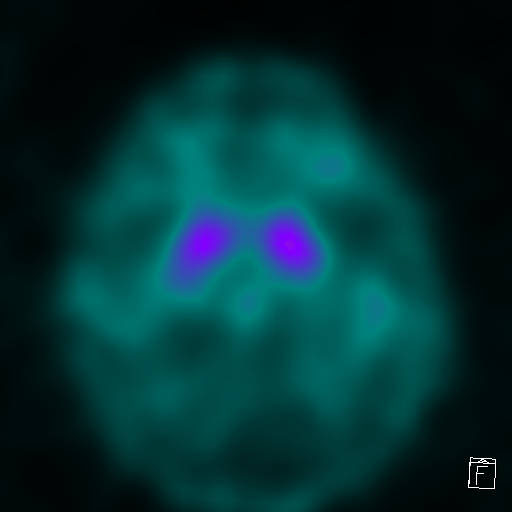
[im 22/38]
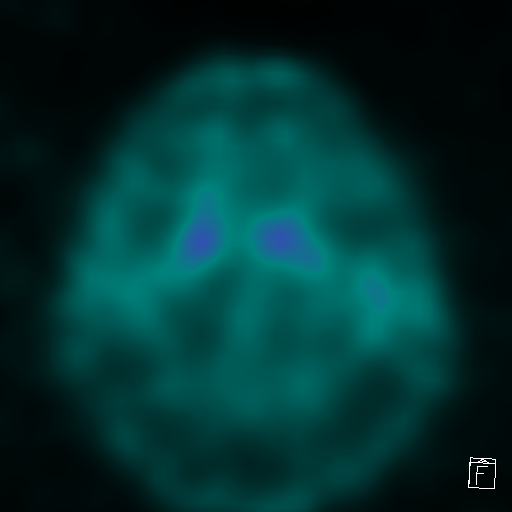
[im 25/38]
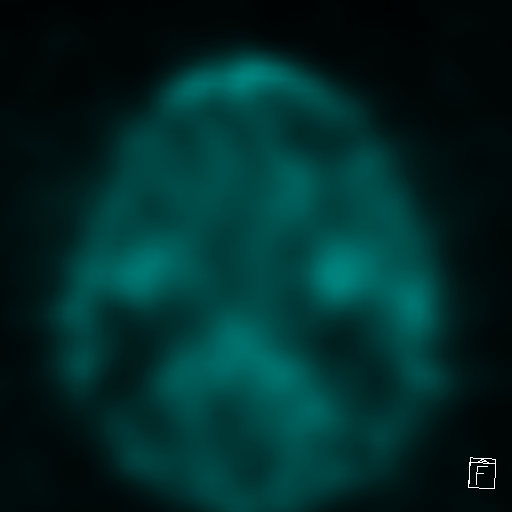
[im 27/38]
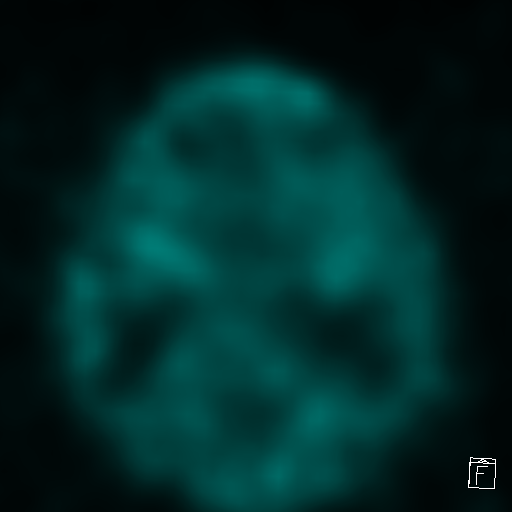
[im 29/38]
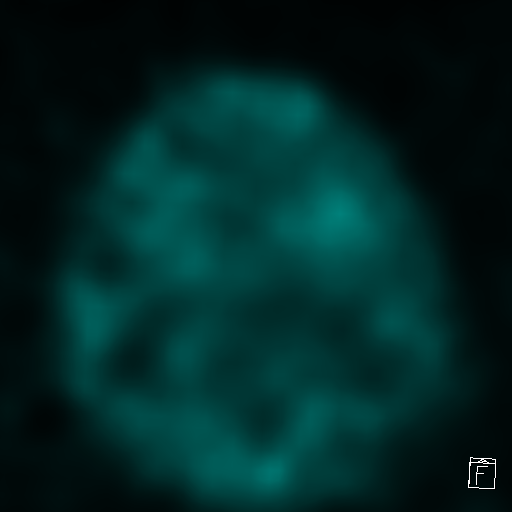
[im 30/38]
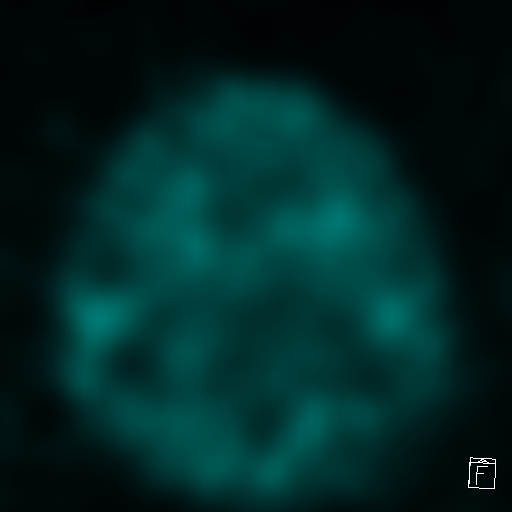
[im 32/38]
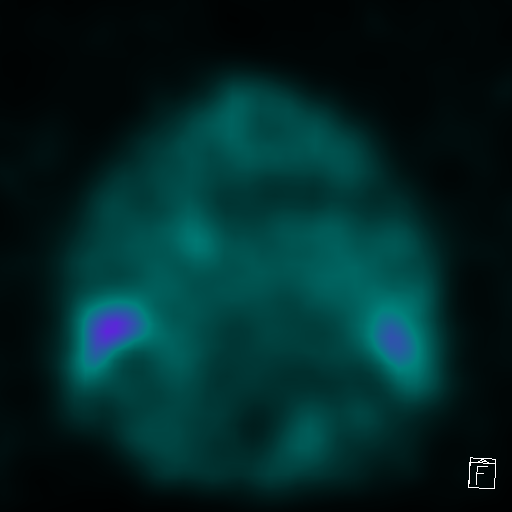
[im 36/38]
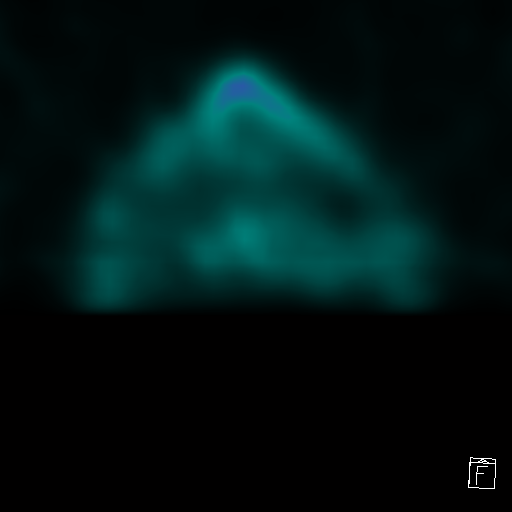
[im 38/38]
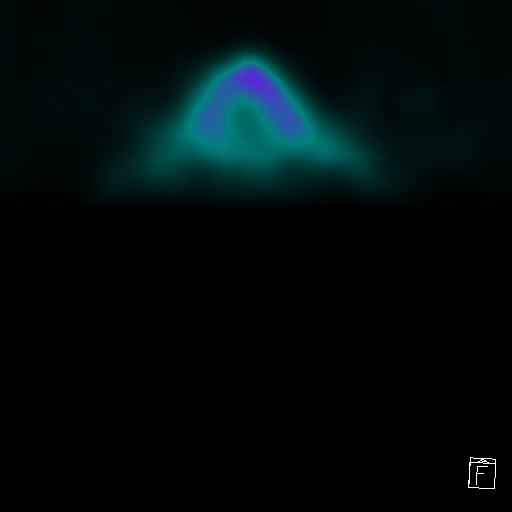

[33 of 40 positions shown; findings below may reference images not displayed]

FINDINGS: There is a overall decreased activity within LEFT and RIGHT basal
ganglia. There is focal decreased activity in the posterior aspect
of the LEFT striatum (putamen). Mild decreased activity in the
posterior aspect of the RIGHT stratum to a lesser degree. Decreased
activity in the head of the RIGHT caudate nucleus greater than the
LEFT.
IMPRESSION: Decreased activity in the LEFT and RIGHT basal ganglia most
prominent in the posterior LEFT striatum. Findings are suggestive of
Parkinson's syndrome pathology.

## 2021-09-03 NOTE — Progress Notes (Deleted)
Assessment/Plan:   1.  ET/PD   -DaTscan abnormal with evidence of dopamine loss  -Continue carbidopa/levodopa 25/100 CR, 1 tablet 3 times per day  -if above does not help balance/feeling of weakness, then will increase the carbidopa/levodopa in the future.    -We discussed that it used to be thought that levodopa would increase risk of melanoma but now it is believed that Parkinsons itself likely increases risk of melanoma. he is to get regular skin checks.  He just went recently.    -he asks about driving.  I think its okay for him to drive, non interstate, non night.  He is cognitively intact.  Reflex time slightly slowed.  Wife does most of driving   2.  Gait disorder, multifactorial  -Likely from peripheral neuropathy, cerebellar outflow tracts from essential tremor and potentially even Parkinson's.  -discussed that foot numbness from PN and not from Parkinsons Disease       Subjective:   Johnny Fox was seen today in follow up for ET/Parkinsons disease.  This patient is accompanied in the office by his spouse who supplements the history.  Wife with patient and supplements hx.  ***last visit, we put the patient in therapies and decided if that did not help the feeling of weakness and off balance we will try to increase levodopa.  He reports today that ***    Current prescribed movement disorder medications: Carbidopa/levodopa 25/100 CR, 1 tablet 3 times per day    PREVIOUS MEDICATIONS: Sinemet (just changed to see if it help fatigue - it didn't)  ALLERGIES:   Allergies  Allergen Reactions   Chocolate Flavor Other (See Comments)    sneezing   Food Other (See Comments)    Chocolate-Sneezing    CURRENT MEDICATIONS:  Outpatient Encounter Medications as of 09/09/2021  Medication Sig   acetaminophen (TYLENOL) 325 MG tablet Take 325 mg by mouth every 4 (four) hours as needed for mild pain or headache.   Ascorbic Acid (VITAMIN C) 1000 MG tablet Take 1,000 mg by mouth  daily.   aspirin EC 81 MG tablet Take 1 tablet (81 mg total) by mouth daily.   Benfotiamine 150 MG CAPS Take 150 mg by mouth daily.   Carbidopa-Levodopa ER (SINEMET CR) 25-100 MG tablet controlled release Take 1 tablet by mouth in the morning, at noon, and at bedtime. 7am/11am/4pm   cholecalciferol (VITAMIN D) 1000 UNITS tablet Take 1,000 Units by mouth daily.   Cholecalciferol (VITAMIN D3) 50 MCG (2000 UT) capsule 1 tablet   Coenzyme Q10 (COQ-10) 100 MG CAPS Take 1 capsule by mouth daily.   Cyanocobalamin (B-12) 5000 MCG CAPS Take 1 tablet by mouth 3 (three) times a week.   furosemide (LASIX) 20 MG tablet 1/2 tablet   hydrALAZINE (APRESOLINE) 10 MG tablet Take 1 to 2 tablets as needed for systolic blood pressure over 150 (Patient not taking: Reported on 04/09/2021)   naproxen sodium (ALEVE) 220 MG tablet Take 220 mg by mouth daily as needed (pain).   nitroGLYCERIN (NITROSTAT) 0.4 MG SL tablet Place 1 tablet (0.4 mg total) under the tongue every 5 (five) minutes as needed for chest pain.   Omega-3 Fatty Acids (FISH OIL) 1000 MG CAPS Take 1,000 mg by mouth 2 (two) times daily after a meal.    rosuvastatin (CRESTOR) 5 MG tablet Take 5 mg by mouth. Take one tablet by mouth Monday through Friday.   TURMERIC PO Take 1 capsule by mouth 2 (two) times daily.  vitamin B-12 (CYANOCOBALAMIN) 100 MCG tablet 1 tablet   No facility-administered encounter medications on file as of 09/09/2021.    Objective:   PHYSICAL EXAMINATION:    VITALS:   There were no vitals filed for this visit.     GEN:  The patient appears stated age and is in NAD. HEENT:  Normocephalic, atraumatic.  The mucous membranes are moist. The superficial temporal arteries are without ropiness or tenderness. CV:  RRR Lungs:  CTAB Neck/HEME:  There are no carotid bruits bilaterally.  Neurological examination:  Orientation: The patient is alert and oriented x3. Cranial nerves: There is good facial symmetry with facial hypomimia.  The speech is fluent and clear. Soft palate rises symmetrically and there is no tongue deviation. Hearing is decreased to conversational tone. Sensation: Sensation is intact to light touch throughout Motor: Strength is at least antigravity x4.  Movement examination: Tone: there is normal tone in the UE/LE Abnormal movements: no rest tremor.  Mild postural tremor Coordination:  There is mild  decremation with RAM's, with any form of RAMS, including alternating supination and pronation of the forearm, hand opening and closing, finger taps, heel taps and toe taps on the left. Gait and Station: The patient has min difficulty arising out of a deep-seated chair without the use of the hands.  He is dragging the L leg a bit and just a bit slower.     Total time spent on today's visit was *** minutes, including both face-to-face time and nonface-to-face time.  Time included that spent on review of records (prior notes available to me/labs/imaging if pertinent), discussing treatment and goals, answering patient's questions and coordinating care.  Cc:  Josetta Huddle, MD

## 2021-09-09 ENCOUNTER — Ambulatory Visit: Payer: Medicare Other | Admitting: Neurology

## 2021-09-09 NOTE — Progress Notes (Signed)
Assessment/Plan:   1.  ET/PD   -DaTscan abnormal with evidence of dopamine loss  -increase carbidopa/levodopa 25/100 CR, 1.5 tablet 3 times per day  -We discussed that it used to be thought that levodopa would increase risk of melanoma but now it is believed that Parkinsons itself likely increases risk of melanoma. he is to get regular skin checks.  He just went recently.    -proud of him for doing rsb   2.  Gait disorder, multifactorial  -Likely from peripheral neuropathy, cerebellar outflow tracts from essential tremor and potentially even Parkinson's.  -discussed that foot numbness from PN and not from Parkinsons Disease   3.  Urinary incontinence  -discussed myrbetriq and gemtesa.  They can discuss with pcp  4.  Some GAD  -discussed meds but they decided its mostly related to having wife drive since he isn't driving  Subjective:   Johnny Fox was seen today in follow up for ET/Parkinsons disease.  This patient is accompanied in the office by his spouse who supplements the history.  Wife with patient and supplements hx.  last visit, we put the patient in therapies and decided if that did not help the feeling of weakness and off balance we will try to increase levodopa.  He reports today that PT really helped and he would like to do that again.  He was able to get in/out of the car easier after that but is having trouble again.  He is tired.  No hallucinations.  No near syncope.  Having more bladder nocturia but he worries about SE of medication    Current prescribed movement disorder medications: Carbidopa/levodopa 25/100 CR, 1 tablet 3 times per day    PREVIOUS MEDICATIONS: Sinemet (just changed to see if it help fatigue - it didn't)  ALLERGIES:   Allergies  Allergen Reactions   Chocolate Flavor Other (See Comments)    sneezing   Food Other (See Comments)    Chocolate-Sneezing    CURRENT MEDICATIONS:  Outpatient Encounter Medications as of 09/11/2021   Medication Sig   acetaminophen (TYLENOL) 325 MG tablet Take 325 mg by mouth every 4 (four) hours as needed for mild pain or headache.   Ascorbic Acid (VITAMIN C) 1000 MG tablet Take 1,000 mg by mouth daily.   aspirin EC 81 MG tablet Take 1 tablet (81 mg total) by mouth daily.   Benfotiamine 150 MG CAPS Take 150 mg by mouth daily.   Carbidopa-Levodopa ER (SINEMET CR) 25-100 MG tablet controlled release Take 1 tablet by mouth in the morning, at noon, and at bedtime. 7am/11am/4pm   cholecalciferol (VITAMIN D) 1000 UNITS tablet Take 1,000 Units by mouth daily.   Cholecalciferol (VITAMIN D3) 50 MCG (2000 UT) capsule 1 tablet   Coenzyme Q10 (COQ-10) 100 MG CAPS Take 1 capsule by mouth daily.   Cyanocobalamin (B-12) 5000 MCG CAPS Take 1 tablet by mouth 3 (three) times a week.   furosemide (LASIX) 20 MG tablet 1/2 tablet   naproxen sodium (ALEVE) 220 MG tablet Take 220 mg by mouth daily as needed (pain).   nitroGLYCERIN (NITROSTAT) 0.4 MG SL tablet Place 1 tablet (0.4 mg total) under the tongue every 5 (five) minutes as needed for chest pain.   Omega-3 Fatty Acids (FISH OIL) 1000 MG CAPS Take 1,000 mg by mouth 2 (two) times daily after a meal.    rosuvastatin (CRESTOR) 5 MG tablet Take 5 mg by mouth. Take one tablet by mouth Monday through Friday.   TURMERIC  PO Take 1 capsule by mouth 2 (two) times daily.    vitamin B-12 (CYANOCOBALAMIN) 100 MCG tablet 1 tablet   hydrALAZINE (APRESOLINE) 10 MG tablet Take 1 to 2 tablets as needed for systolic blood pressure over 150 (Patient not taking: Reported on 09/11/2021)   No facility-administered encounter medications on file as of 09/11/2021.    Objective:   PHYSICAL EXAMINATION:    VITALS:   Vitals:   09/11/21 0843  BP: 111/69  Pulse: 100  SpO2: 95%  Weight: 177 lb (80.3 kg)  Height: '5\' 10"'$  (1.778 m)       GEN:  The patient appears stated age and is in NAD. HEENT:  Normocephalic, atraumatic.  The mucous membranes are moist. The superficial  temporal arteries are without ropiness or tenderness. CV:  RRR Lungs:  CTAB Neck/HEME:  There are no carotid bruits bilaterally.  Neurological examination:  Orientation: The patient is alert and oriented x3. Cranial nerves: There is good facial symmetry with facial hypomimia. The speech is fluent and clear. Soft palate rises symmetrically and there is no tongue deviation. Hearing is decreased to conversational tone. Sensation: Sensation is intact to light touch throughout Motor: Strength is at least antigravity x4.  Movement examination: Tone: there is normal tone in the UE/LE Abnormal movements: rare rest tremor of the R thumb Coordination:  There is mild  decremation with RAM's, with any form of RAMS, including alternating supination and pronation of the forearm, hand opening and closing, finger taps, heel taps and toe taps on the left. Gait and Station: The patient has min difficulty arising out of a deep-seated chair without the use of the hands.  He turns en bloc.  Ambulates with the cane.   Total time spent on today's visit was 31 minutes, including both face-to-face time and nonface-to-face time.  Time included that spent on review of records (prior notes available to me/labs/imaging if pertinent), discussing treatment and goals, answering patient's questions and coordinating care.  Cc:  Josetta Huddle, MD

## 2021-09-11 ENCOUNTER — Encounter: Payer: Self-pay | Admitting: Neurology

## 2021-09-11 ENCOUNTER — Ambulatory Visit (INDEPENDENT_AMBULATORY_CARE_PROVIDER_SITE_OTHER): Payer: Medicare Other | Admitting: Neurology

## 2021-09-11 VITALS — BP 111/69 | HR 100 | Ht 70.0 in | Wt 177.0 lb

## 2021-09-11 DIAGNOSIS — I2510 Atherosclerotic heart disease of native coronary artery without angina pectoris: Secondary | ICD-10-CM

## 2021-09-11 DIAGNOSIS — G20 Parkinson's disease: Secondary | ICD-10-CM

## 2021-09-11 MED ORDER — CARBIDOPA-LEVODOPA ER 25-100 MG PO TBCR: EXTENDED_RELEASE_TABLET | ORAL | 1 refills | Status: DC

## 2021-09-11 NOTE — Patient Instructions (Addendum)
Increase carbidopa/levodopa CR 25/100, 1.5 tabs at 7am/11am/4pm You can ask your PCP about myrbetriq or Gemtesa for the bladder  Local and Online Resources for Power over Parkinson's Group September 2023  LOCAL [place] PARKINSON'S GROUPS  Power over Parkinson's Group:   Power Over Parkinson's Patient Education Group will be Wednesday, September 13th-*Hybrid meting*- in person at [facility] location and via [facility] at 2 pm.   Upcoming Power over [employer] Meetings:  2nd Wednesdays of the month at 2 pm:  September 13th, October 11th, November 8th Contact Amy Marriott at [email or web address] if interested in participating in this group Parkinson's Care Partners Group:    3rd Mondays, Contact Misty Paladino Atypical Parkinsonian Patient Group:   4th Wednesdays, [place] If you are interested in participating in these groups with Misty, please contact her directly for how to join those meetings.  Her contact information is [email or web address].    LOCAL EVENTS AND NEW OFFERINGS New PWR! Moves [employer] Instructor-Led Classes offering at [employer]!  Wednesdays 1-2 pm.   Contact Vonna Kotyk at  [email or web address] or Caron Presume at [place], [email or web address] Dance for Parkinson 's classes will be on Tuesdays 9:30am-10:30am starting October 3-December 12 with a break the week of November 21 . Located in the [employer]  which is in the first floor of the [employer] ([place] for Parkinson's will be held on 2nd and 4th Mondays at 11:00am . First class will start  September 25th.  Located at the [place] ([place].) Through support from the [place] and Drumming for Parkinson's classes are free for both patients and caregivers.  Contact Misty Taylor-Paladino for more details about  registering.  [place]:  www.[email or web address] PD Health at Home continues:  Mindfulness Mondays, Wellness Wednesdays, Fitness Fridays  Upcoming Education:   Navigating Nutrition with PD.  Wednesday, Sept. 6th 1:00-2:00 pm Understanding Mind and Memory.  Wednesday, Sept. 20th 1:00-2:00 pm  Expert Briefing:    Parkinson's Disease and the Bladder.  Wednesday, Sept. 13th 1:00-2:00 pm Parkinson's and the Gut-Brain Connection.  Wednesday, Oct. 11th 1:00-2:00 pm Register for expert briefings (webinars) at [email or web address] Please check out their website to sign up for emails and see their full online offerings   [place]:  [email or web address]  Third Thursday Webinars:  On the third Thursday of every month at 12 p.m. ET, join our free live webinars to learn about various aspects of living with Parkinson's disease and our work to speed medical breakthroughs. Upcoming Webinar:  Stay tuned Check out additional information on their website to see their full online offerings  [employer]:  [email or web address] Upcoming Webinar:   Stay tuned Webinar Series:  Living with Parkinson's Meetup.   Third Thursdays each month, 3 pm Care Partner Monthly Meetup.  With Robin Searing [place]in[place].  First Tuesday of each month, 2 pm Check out additional information to Live Well Today on their website  Parkinson and Movement Disorders (PMD) Alliance:  www.[email or web address] [employer] Online:  Online Education Events Sign up for emails, which are sent weekly to give you updates on programming and online offerings  Parkinson's Association of the [place]:  www.[email or web address] Information on online support groups, education events, and online exercises including Yoga, Parkinson's exercises and more-LOTS of information on links to PD resources and online  events Virtual Support Group through Parkinson's Association of the [place]; next one is scheduled for Wednesday,  October 4th at 2 pm. (No September meeting due to the symposium.  These are typically scheduled for the 1st Wednesday of the month at 2 pm).  Visit website for details. Register for "Caring for Parkinson's-Caring for You", 9th Annual Symposium.  In-person event in [place].  September 9th.  To register:  [email or web address]_[email or web address] MOVEMENT AND EXERCISE OPPORTUNITIES PWR! Moves Classes at [place].  Wednesdays 10 and 11 am.   Contact Amy Marriott, PT [email or web address] if interested. NEW PWR! Moves Class offerings at [employer].  Wednesdays 1-2 pm.  Contact Vonna Kotyk at  [email or web address] or Caron Presume at [employer],  [email or web address] Parkinson's Wellness Recovery (PWR! Moves)  www.[email or web address] Info on the PWR! Virtual Experience:  You will have access to our expertise through self-assessment, guided plans that start with the PD-specific fundamentals, educational content, tips, Q&A with an expert, and a growing [employer] of PD-specific pre-recorded and live exercise classes of varying types and intensity - both physical and cognitive! If that is not enough, we offer 1:1 wellness consultations (in-person or virtual) to personalize your PWR! Art therapist.  [place] Fridays:  As part of the PD Health @ Home program, this free video series focuses each week on one aspect of fitness designed to support people living with Parkinson's.  These weekly videos highlight the [place] recent fitness guidelines for people with Parkinson's disease. [address] Dance for PD website is offering free, live-stream classes throughout the week, as well as links to [employer].si of classes:   [email or web address] Virtual dance and Pilates for Parkinson's classes: Click on the Community Tab> Parkinson's Movement Initiative Tab.  To register for classes and for more information, visit www.[employer] and click the "community" tab.  [employer] Parkinson's Cycling Classes  [employer]:  Thursdays @ Noon-Live classes at 02-19-1983 ([employer] at [email or web address] or [phone]) [employer]: Virtual Classes Mondays and Thursdays 02-19-1983 classes Tuesday, Wednesday and Thursday (contact [place] at [email or web address]  or [phone]) [place] Varied levels of classes are offered Tuesdays and Thursdays at 02-19-1983.  Stretching with [employer] weekly class is also offered for people with Parkinson's To observe a class or for more information, call [phone] or email [phone] at [email or web address] ADDITIONAL SUPPORT AND RESOURCES [employer]:Online Caregiver Education Opportunities:  www.well-[email or web address]/caregiver-education/[email or web address]-support-group.  You may also contact Hezzie Bump at [email or web address] or [phone].    Coping with Difficult Caregiver Emotions.  Wednesday, September 20th, 10:30 am-12.  The [employer], [facility]ive Navigating the [place] of Senior Care Options.  Thursday, September 28th, 4-5:15 pm.  The [facility]. Well-Spring Navigator:  07-05-1994 program, a free service to help individuals and families through the journey of determining care for older adults.  The "Navigator" is a [facility], [employer], who will speak with a prospective client and/or loved ones to provide an assessment of the situation and a set of recommendations for a personalized care plan -- all free of charge, and whether [employer] offers the needed service or not. If the need is not a service we provide, we are well-connected with  reputable programs in town that we can refer you to.  www.well-springsolutions.org or to speak with the Navigator, call [phone].

## 2021-09-19 ENCOUNTER — Other Ambulatory Visit: Payer: Self-pay | Admitting: Neurology

## 2021-09-19 ENCOUNTER — Ambulatory Visit (INDEPENDENT_AMBULATORY_CARE_PROVIDER_SITE_OTHER): Payer: Medicare Other | Admitting: Podiatry

## 2021-09-19 ENCOUNTER — Encounter: Payer: Self-pay | Admitting: Podiatry

## 2021-09-19 DIAGNOSIS — M79676 Pain in unspecified toe(s): Secondary | ICD-10-CM | POA: Diagnosis not present

## 2021-09-19 DIAGNOSIS — B351 Tinea unguium: Secondary | ICD-10-CM

## 2021-09-19 DIAGNOSIS — E119 Type 2 diabetes mellitus without complications: Secondary | ICD-10-CM

## 2021-09-19 DIAGNOSIS — G629 Polyneuropathy, unspecified: Secondary | ICD-10-CM

## 2021-09-19 NOTE — Progress Notes (Signed)
This patient returns to my office for at risk foot care.  This patient requires this care by a professional since this patient will be at risk due to having  Diabetic neuropathy and Parkinsons.  This patient is unable to cut nails himself since the patient cannot reach his nails.These nails are painful walking and wearing shoes.  This patient presents for at risk foot care today.  General Appearance  Alert, conversant and in no acute stress.  Vascular  Dorsalis pedis and posterior tibial  pulses are palpable  bilaterally.  Capillary return is within normal limits  bilaterally. Temperature is within normal limits  bilaterally.  Neurologic  Senn-Weinstein monofilament wire diminished  bilaterally. Muscle power within normal limits bilaterally.  Nails Thick disfigured discolored nails with subungual debris  from hallux to fifth toes bilaterally. No evidence of bacterial infection or drainage bilaterally.  Orthopedic  No limitations of motion  feet .  No crepitus or effusions noted.  No bony pathology or digital deformities noted.  Skin  normotropic skin with no porokeratosis noted bilaterally.  No signs of infections or ulcers noted.     Onychomycosis  Pain in right toes  Pain in left toes  Consent was obtained for treatment procedures.   Mechanical debridement of nails 1-5  bilaterally performed with a nail nipper.  Filed with dremel without incident.    Return office visit   3 months                   Told patient to return for periodic foot care and evaluation due to potential at risk complications.   Zonnie Landen DPM  

## 2021-09-19 NOTE — Therapy (Signed)
OUTPATIENT PHYSICAL THERAPY NEURO EVALUATION   Patient Name: Johnny Fox MRN: [id number] DOB:[birth year], 88 y.o., male Today's Date: 09/22/2021   PCP: Josetta Huddle, MD REFERRING PROVIDER: Tat, Eustace Quail, DO   PT End of Session - 09/22/21 1212     Visit Number 1    Number of Visits 10    Date for PT Re-Evaluation 11/03/21    Authorization Type Medicare/BCBS    PT Start Time 0928    PT Stop Time 1010    PT Time Calculation (min) 42 min    Activity Tolerance Patient tolerated treatment well    Behavior During Therapy [facility] for tasks assessed/performed             Past Medical History:  Diagnosis Date   Actinic keratoses    and seborrheic keratoses   Arrhythmia    afib postoperatively after CABG in 2004, No recurrence   Coronary artery disease    Diabetes mellitus without complication ([place])    diet controlled   Dyslipidemia    ED (erectile dysfunction)    Elevated PSA    2010, workup with Dr. Risa Grill   H/O prostate biopsy    Hypertension    Onychomycosis    Prostate cancer ([place])    PSA's run approx 7- 2011-2013   Right shoulder injury    july 2014, Dr. Lennette Bihari Supple   Tick bite of abdomen    2013, treated with 3wks of doxycycline, tick engorged 3.48m nodule left thyroid 4/14- seen on carotid u/s   Past Surgical History:  Procedure Laterality Date   [place] BILATERAL     CORONARY ARTERY BYPASS GRAFT  2004   Patient Active Problem List   Diagnosis Date Noted   Aortic aneurysm ([place] 03/26/2021   Abnormal gait 03/13/2020   Bronchitis 03/13/2020   Chin laceration 03/13/2020   Cough 03/13/2020   Cramp and spasm 03/13/2020   Disorder of penis 03/13/2020   Dizzy spells 03/13/2020   Dyspnea 03/13/2020   Edema of lower extremity 03/13/2020   Elevated PSA 03/13/2020   Encounter for general adult medical examination without abnormal findings 03/13/2020   Hearing loss in left ear 03/13/2020   History of malignant neoplasm  of prostate 03/13/2020   Impacted cerumen 03/13/2020   Low back pain 03/13/2020   Malignant tumor of prostate ([place] 03/13/2020   Neuropathy 03/13/2020   Orthostatic hypotension 03/13/2020   Other long term (current) drug therapy 03/13/2020   Other specified abnormal findings of blood chemistry 03/13/2020   Other sprain of right hip, initial encounter 03/13/2020   Paresthesia 03/13/2020   Parkinson's disease ([place] 03/13/2020   Paroxysmal atrial fibrillation ([place] 03/13/2020   Polyneuropathy due to type 2 diabetes mellitus ([place] 03/13/2020   Presence of aortocoronary bypass graft 03/13/2020   Sciatica, right side 03/13/2020   Shoulder joint pain 03/13/2020   Skin sensation disturbance 03/13/2020   Type 2 diabetes mellitus without complications ([place] 024/40/1027  Upper respiratory infection 03/13/2020   Vitamin D deficiency 03/13/2020   Asymmetrical left sensorineural hearing loss 01/13/2017   Right leg numbness 01/14/2015   Thyroid nodule 01/14/2015   Coronary atherosclerosis of native coronary artery 02/15/2013   Essential hypertension, benign 02/15/2013   Pure hypercholesterolemia 02/15/2013   Fatigue 02/15/2013    ONSET DATE: couple months  REFERRING DIAG: Parkinson's disease  THERAPY DIAG:  Muscle weakness (generalized)  Other symptoms and signs involving the nervous system  Unsteadiness on feet  Rationale for Evaluation  and Treatment Rehabilitation  SUBJECTIVE:                                                                                                                                                                                              SUBJECTIVE STATEMENT: Patient reports more trouble getting in/out of the car and getting out of bed, other than that he is feeling like he is taking pretty lengthy steps. When the weather allows, he walks in the park for 30 minutes with 1 stop in between. Using the cane most of the time when walking. Still does not have much  energy. Having to be careful when standing to shave, shower, or washing his face. Going to [employer] 1x/week.  Pt accompanied by: self  PERTINENT HISTORY: CAD, DM, HLD, HTN, prostate CA, CABG 2004  PAIN:  Are you having pain? No  PRECAUTIONS: Fall  WEIGHT BEARING RESTRICTIONS No  FALLS: Has patient fallen in last 6 months? No  LIVING ENVIRONMENT: Lives with: lives with their family Lives in: House/apartment Stairs:  1 level house with level entry; 2 steps with handrail into porch room Has following equipment at home: Environmental consultant - 2 wheeled, Shower bench, Grab bars, and round tipped cane  PLOF: Independent  PATIENT GOALS make it easier to get in/out of bed and the car  OBJECTIVE:   DIAGNOSTIC FINDINGS: none recent  COGNITION: Overall cognitive status: Within functional limits for tasks assessed   SENSATION: Reports neuropathy in B feet but denies N/T  COORDINATION: Alt pronation/supination with mild R dysmetria, alt toe tap intact, finger to nose with mild-mod dysmetria R UE  MUSCLE TONE: moderate B LE rigidity in knees and ankles   POSTURE: increased thoracic kyphosis  LOWER EXTREMITY ROM:     Active  Right Eval Left Eval  Hip flexion    Hip extension    Hip abduction    Hip adduction    Hip internal rotation    Hip external rotation    Knee flexion    Knee extension    Ankle dorsiflexion 3 -4  Ankle plantarflexion    Ankle inversion    Ankle eversion     (Blank rows = not tested)  LOWER EXTREMITY MMT:    MMT (in sitting) Right Eval Left Eval  Hip flexion 4+ 4  Hip extension    Hip abduction 4 4  Hip adduction 4 4  Hip internal rotation    Hip external rotation    Knee flexion 4+ 4+  Knee extension 4+ 4  Ankle dorsiflexion 4 4  Ankle plantarflexion 4- 4  Ankle inversion    Ankle  eversion    (Blank rows = not tested)  GAIT: Gait pattern:  smaller step length evident when getting up from a seat, improved continuity as he gets  going Assistive device utilized: Single point cane Level of assistance: Complete Independence   FUNCTIONAL TESTs:   [facility] PT Assessment - 09/22/21 0001       Standardized Balance Assessment   Standardized Balance Assessment 10 meter walk test;Five Times Sit to Stand;Timed Up and Go Test    Five times sit to stand comments  13.40 sec without UEs    10 Meter Walk 13.7 sec with SPC   2.39 ft/sec     Timed Up and Go Test   Normal TUG (seconds) 12.34    Manual TUG (seconds) 12    Cognitive TUG (seconds) 14.03    TUG Comments without AD; 13.7% increase with TUG cog               PATIENT EDUCATION: Education details: prognosis, POC, HEP Person educated: Patient Education method: Explanation, Demonstration, Tactile cues, Verbal cues, and Handouts Education comprehension: verbalized understanding and returned demonstration   HOME EXERCISE PROGRAM: Access Code: [email or web address] URL: [email or web address] Date: 09/22/2021 Prepared by: [facility]  Exercises - Sidelying Thoracic Rotation with Open Book  - 1 x daily - 5 x weekly - 2 sets - 10 reps - Forward Backward Weight Shift with Counter Support  - 1 x daily - 5 x weekly - 2 sets - 10 reps - Standing Gastroc Stretch at Counter  - 1 x daily - 5 x weekly - 2-3 sets - 30 sec hold - Heel Toe Raises with Counter Support  - 1 x daily - 5 x weekly - 2 sets - 10 reps - Seated Isometric Hip Abduction with Resistance (No resistance, just seated step outs)  - 1 x daily - 5 x weekly - 2 sets - 10 reps  GOALS: Goals reviewed with patient? Yes  SHORT TERM GOALS: Target date: 10/13/2021  Patient to be independent with initial HEP. Baseline: HEP initiated Goal status: INITIAL    LONG TERM GOALS: Target date: 11/03/2021  Patient to be independent with advanced HEP. Baseline: Not yet initiated  Goal status: INITIAL  Patient to verbalize understanding of local Parkinson's Disease community  resources including community fitness post D/C.   Baseline: Not yet initiated  Goal status: INITIAL  Patient to improve MiniBestTest score to atleast 17-21 to decrease risk of falls.  Baseline: NT Goal status: INITIAL  Patient to demonstrate <10% increase between TUG and TUG cognitive/manual to improve ability to dual task in home/community.  Baseline: 13.7% TUG cog Goal status: INITIAL  Patient to report 60% improved ease in car transfers and supine>sit out of bed.  Baseline: Not yet initiated Goal status: INITIAL  Patient to verbalize tips to reduce freezing/festination with gait and turns. Baseline: Not yet initiated  Goal status: INITIAL      ASSESSMENT:  CLINICAL IMPRESSION:  Patient is a 88 y/o M presenting to OPPT with c/o increased difficulty with car transfers and bed mobility for the past couple months. Also reports instability with standing balance with hygiene and grooming tasks. Patient today presenting with R>L UE dysmetria, B LE rigidity, decreased B ankle dorsiflexion AROM, B hip and ankle weakness, gait deviations, decreased gait speed, and difficulty with dual tasking. Patient was educated on gentle stretching and strengthening HEP and reported understanding. Would benefit from skilled PT services 2 x/week for 3 weeks  followed by 1 x/week for 3 weeks to address aforementioned impairments in order to optimize level of function.    OBJECTIVE IMPAIRMENTS Abnormal gait, decreased balance, decreased coordination, decreased endurance, decreased ROM, decreased strength, increased muscle spasms, impaired flexibility, and postural dysfunction.   ACTIVITY LIMITATIONS carrying, lifting, bending, sitting, standing, squatting, stairs, transfers, bed mobility, dressing, reach over head, and hygiene/grooming  PARTICIPATION LIMITATIONS: meal prep, cleaning, laundry, shopping, community activity, yard work, and church  PERSONAL FACTORS Age, Past/current experiences, Time since  onset of injury/illness/exacerbation, and 3+ comorbidities: CAD, DM, HLD, HTN, prostate CA, CABG 2004  are also affecting patient's functional outcome.   REHAB POTENTIAL: Good  CLINICAL DECISION MAKING: Evolving/moderate complexity  EVALUATION COMPLEXITY: Moderate  PLAN: PT FREQUENCY:  2x/week for 3 weeks, 1x/week for 3 weeks  PT DURATION: other: see above  PLANNED INTERVENTIONS: Therapeutic exercises, Therapeutic activity, Neuromuscular re-education, Balance training, Gait training, Patient/Family education, Self Care, Joint mobilization, Stair training, Vestibular training, Canalith repositioning, Aquatic Therapy, Dry Needling, Electrical stimulation, Cryotherapy, Moist heat, Taping, Manual therapy, and Re-evaluation  PLAN FOR NEXT SESSION: reassess HEP, mini best test, work on techniques to assist in improved ease with bed mobility and car transfers   Janene Harvey, PT, DPT 09/22/21 12:41 PM  [place] [facility] [address] [place], [place] [address] Phone # [phone] Fax # [phone]

## 2021-09-22 ENCOUNTER — Other Ambulatory Visit: Payer: Self-pay

## 2021-09-22 ENCOUNTER — Encounter: Payer: Self-pay | Admitting: Physical Therapy

## 2021-09-22 ENCOUNTER — Ambulatory Visit: Payer: Medicare Other | Attending: Neurology | Admitting: Physical Therapy

## 2021-09-22 DIAGNOSIS — M6281 Muscle weakness (generalized): Secondary | ICD-10-CM | POA: Insufficient documentation

## 2021-09-22 DIAGNOSIS — R2681 Unsteadiness on feet: Secondary | ICD-10-CM | POA: Diagnosis not present

## 2021-09-22 DIAGNOSIS — R2689 Other abnormalities of gait and mobility: Secondary | ICD-10-CM | POA: Diagnosis not present

## 2021-09-22 DIAGNOSIS — I480 Paroxysmal atrial fibrillation: Secondary | ICD-10-CM | POA: Diagnosis not present

## 2021-09-22 DIAGNOSIS — I10 Essential (primary) hypertension: Secondary | ICD-10-CM | POA: Diagnosis not present

## 2021-09-22 DIAGNOSIS — G20 Parkinson's disease: Secondary | ICD-10-CM | POA: Diagnosis not present

## 2021-09-22 DIAGNOSIS — R29818 Other symptoms and signs involving the nervous system: Secondary | ICD-10-CM | POA: Insufficient documentation

## 2021-09-22 DIAGNOSIS — E7800 Pure hypercholesterolemia, unspecified: Secondary | ICD-10-CM | POA: Diagnosis not present

## 2021-09-22 DIAGNOSIS — I2510 Atherosclerotic heart disease of native coronary artery without angina pectoris: Secondary | ICD-10-CM | POA: Diagnosis not present

## 2021-09-22 DIAGNOSIS — E1142 Type 2 diabetes mellitus with diabetic polyneuropathy: Secondary | ICD-10-CM | POA: Diagnosis not present

## 2021-09-25 DIAGNOSIS — H5203 Hypermetropia, bilateral: Secondary | ICD-10-CM | POA: Diagnosis not present

## 2021-09-25 DIAGNOSIS — H35033 Hypertensive retinopathy, bilateral: Secondary | ICD-10-CM | POA: Diagnosis not present

## 2021-09-25 DIAGNOSIS — Z9849 Cataract extraction status, unspecified eye: Secondary | ICD-10-CM | POA: Diagnosis not present

## 2021-09-25 DIAGNOSIS — Z961 Presence of intraocular lens: Secondary | ICD-10-CM | POA: Diagnosis not present

## 2021-09-25 DIAGNOSIS — H524 Presbyopia: Secondary | ICD-10-CM | POA: Diagnosis not present

## 2021-09-25 DIAGNOSIS — I10 Essential (primary) hypertension: Secondary | ICD-10-CM | POA: Diagnosis not present

## 2021-09-25 DIAGNOSIS — H52223 Regular astigmatism, bilateral: Secondary | ICD-10-CM | POA: Diagnosis not present

## 2021-09-25 DIAGNOSIS — H3530 Unspecified macular degeneration: Secondary | ICD-10-CM | POA: Diagnosis not present

## 2021-09-25 DIAGNOSIS — H43393 Other vitreous opacities, bilateral: Secondary | ICD-10-CM | POA: Diagnosis not present

## 2021-09-29 ENCOUNTER — Encounter: Payer: Self-pay | Admitting: Physical Therapy

## 2021-09-29 ENCOUNTER — Ambulatory Visit: Payer: Medicare Other | Admitting: Physical Therapy

## 2021-09-29 DIAGNOSIS — R2689 Other abnormalities of gait and mobility: Secondary | ICD-10-CM | POA: Diagnosis not present

## 2021-09-29 DIAGNOSIS — M6281 Muscle weakness (generalized): Secondary | ICD-10-CM | POA: Diagnosis not present

## 2021-09-29 DIAGNOSIS — R2681 Unsteadiness on feet: Secondary | ICD-10-CM

## 2021-09-29 DIAGNOSIS — R29818 Other symptoms and signs involving the nervous system: Secondary | ICD-10-CM

## 2021-09-29 DIAGNOSIS — G20 Parkinson's disease: Secondary | ICD-10-CM | POA: Diagnosis not present

## 2021-09-29 NOTE — Therapy (Signed)
OUTPATIENT PHYSICAL THERAPY NEURO TREATMENT NOTE   Patient Name: Johnny Fox MRN: [id number] DOB:1928-08-26, 88 y.o., male Today's Date: 09/29/2021   PCP: Josetta Huddle, MD REFERRING PROVIDER: Tat, Eustace Quail, DO   PT End of Session - 09/29/21 0932     Visit Number 2    Number of Visits 10    Date for PT Re-Evaluation 11/03/21    Authorization Type Medicare/BCBS    PT Start Time 0933    PT Stop Time 1016    PT Time Calculation (min) 43 min    Activity Tolerance Patient tolerated treatment well    Behavior During Therapy [facility] for tasks assessed/performed              Past Medical History:  Diagnosis Date   Actinic keratoses    and seborrheic keratoses   Arrhythmia    afib postoperatively after CABG in 2004, No recurrence   Coronary artery disease    Diabetes mellitus without complication ([place])    diet controlled   Dyslipidemia    ED (erectile dysfunction)    Elevated PSA    2010, workup with Dr. Risa Grill   H/O prostate biopsy    Hypertension    Onychomycosis    Prostate cancer ([place])    PSA's run approx 7- 2011-2013   Right shoulder injury    july 2014, Dr. Lennette Bihari Supple   Tick bite of abdomen    2013, treated with 3wks of doxycycline, tick engorged 3.16m nodule left thyroid 4/14- seen on carotid u/s   Past Surgical History:  Procedure Laterality Date   [place]     CORONARY ARTERY BYPASS GRAFT  2004   Patient Active Problem List   Diagnosis Date Noted   [place]ortic aneurysm ([place] 03/26/2021   Abnormal gait 03/13/2020   Bronchitis 03/13/2020   Chin laceration 03/13/2020   Cough 03/13/2020   Cramp and spasm 03/13/2020   Disorder of penis 03/13/2020   Dizzy spells 03/13/2020   Dyspnea 03/13/2020   Edema of lower extremity 03/13/2020   Elevated PSA 03/13/2020   Encounter for general adult medical examination without abnormal findings 03/13/2020   Hearing loss in left ear 03/13/2020   History of malignant  neoplasm of prostate 03/13/2020   Impacted cerumen 03/13/2020   Low back pain 03/13/2020   Malignant tumor of prostate ([place] 03/13/2020   Neuropathy 03/13/2020   Orthostatic hypotension 03/13/2020   Other long term (current) drug therapy 03/13/2020   Other specified abnormal findings of blood chemistry 03/13/2020   Other sprain of right hip, initial encounter 03/13/2020   Paresthesia 03/13/2020   Parkinson's disease ([place] 03/13/2020   Paroxysmal atrial fibrillation ([place] 03/13/2020   Polyneuropathy due to type 2 diabetes mellitus ([place] 03/13/2020   Presence of aortocoronary bypass graft 03/13/2020   Sciatica, right side 03/13/2020   Shoulder joint pain 03/13/2020   Skin sensation disturbance 03/13/2020   Type 2 diabetes mellitus without complications ([place] 060/73/7106  Upper respiratory infection 03/13/2020   Vitamin D deficiency 03/13/2020   Asymmetrical left sensorineural hearing loss 01/13/2017   Right leg numbness 01/14/2015   Thyroid nodule 01/14/2015   Coronary atherosclerosis of native coronary artery 02/15/2013   Essential hypertension, benign 02/15/2013   Pure hypercholesterolemia 02/15/2013   Fatigue 02/15/2013    ONSET DATE: couple months  REFERRING DIAG: Parkinson's disease  THERAPY DIAG:  Unsteadiness on feet  Other symptoms and signs involving the nervous system  Other abnormalities of gait and  mobility  Muscle weakness (generalized)  Rationale for Evaluation and Treatment Rehabilitation  SUBJECTIVE:                                                                                                                                                                                              SUBJECTIVE STATEMENT: No changes since eval.  Haven't done much with the exercises. Exercise routine varies:  walking in the neighborhood park, stationary bike, sit<>stand and leg exercises.  Wife reports they took the mattress foam topper off and getting in/out of the bed  has gotten better. Pt accompanied by: self  PERTINENT HISTORY: CAD, DM, HLD, HTN, prostate CA, CABG 2004  PAIN:  Are you having pain? No  PRECAUTIONS: Fall  WEIGHT BEARING RESTRICTIONS No  FALLS: Has patient fallen in last 6 months? No  LIVING ENVIRONMENT: Lives with: lives with their family Lives in: House/apartment Stairs:  1 level house with level entry; 2 steps with handrail into porch room Has following equipment at home: Environmental consultant - 2 wheeled, Shower bench, Grab bars, and round tipped cane  PLOF: Independent  PATIENT GOALS make it easier to get in/out of bed and the car  OBJECTIVE:   TODAY'S TREATMENT: 09/29/2021 Activity Comments  MiniBESTest:  17/28 See below for details  Sit<>stand x 5 reps   Forward step ups:  to 6" step:  step up/up, down/down x 10, then single limb step ups x 10 reps BUE support, reports fatigue after activity, brief rest break  Sidestep ups to 6" step, 1 UE support x 10 reps   Gait x 250 ft (2:30)  pre gait (HR 88 bpm, O2 98%); post-gait (HR 97 bpm, O2 96%) With cane, educated on walking program for home.       [facility] PT Treatment/Exercise - 09/29/21 0001       Standardized Balance Assessment   Standardized Balance Assessment Mini-BESTest      Mini-BESTest   Sit To Stand Normal: Comes to stand without use of hands and stabilizes independently.    Rise to Toes < 3 s.    Stand on one leg (left) Moderate: < 20 s   2.03, 1.31   Stand on one leg (right) Moderate: < 20 s   1.41, 1.22   Stand on one leg - lowest score 1    Stance - Feet together, eyes open, firm surface  Normal: 30s    Stance - Feet together, eyes closed, foam surface  Severe: Unable    Incline - Eyes Closed Moderate: Stands independently < 30s OR aligns with surface   4.97   Change in Gait  Speed Normal: Significantly changes walkling speed without imbalance    Walk with head turns - Horizontal Moderate: performs head turns with reduction in gait speed.    Walk with pivot  turns Normal: Turns with feet close FAST (< 3 steps) with good balance.    Step over obstacles Moderate: Steps over box but touches box OR displays cautious behavior by slowing gait.                [facility] PT Assessment - 09/29/21 0001       Mini-BESTest   Compensatory Stepping Correction - Forward Normal: Recovers independently with a single, large step (second realignement is allowed).    Compensatory Stepping Correction - Backward Normal: Recovers independently with a single, large step    Compensatory Stepping Correction - Left Lateral Severe: Falls, or cannot step    Compensatory Stepping Correction - Right Lateral Severe:  Falls, or cannot step    Stepping Corredtion Lateral - lowest score 0    Timed UP & GO with Dual Task Moderate: Dual Task affects either counting OR walking (>10%) when compared to the TUG without Dual Task.   see assessment 9/18   Mini-BEST total score 17               PATIENT EDUCATION: Education details: ways to incorporate increased activity levels at home-trying walking 2-3 minutes  Person educated: Patient and Spouse Education method: Explanation, Demonstration, and Verbal cues Education comprehension: verbalized understanding  --------------------------------------------------------------------------------------------------------- Objective measures from eval:   DIAGNOSTIC FINDINGS: none recent  COGNITION: Overall cognitive status: Within functional limits for tasks assessed   SENSATION: Reports neuropathy in B feet but denies N/T  COORDINATION: Alt pronation/supination with mild R dysmetria, alt toe tap intact, finger to nose with mild-mod dysmetria R UE  MUSCLE TONE: moderate B LE rigidity in knees and ankles   POSTURE: increased thoracic kyphosis  LOWER EXTREMITY ROM:     Active  Right Eval Left Eval  Hip flexion    Hip extension    Hip abduction    Hip adduction    Hip internal rotation    Hip external rotation    Knee  flexion    Knee extension    Ankle dorsiflexion 3 -4  Ankle plantarflexion    Ankle inversion    Ankle eversion     (Blank rows = not tested)  LOWER EXTREMITY MMT:    MMT (in sitting) Right Eval Left Eval  Hip flexion 4+ 4  Hip extension    Hip abduction 4 4  Hip adduction 4 4  Hip internal rotation    Hip external rotation    Knee flexion 4+ 4+  Knee extension 4+ 4  Ankle dorsiflexion 4 4  Ankle plantarflexion 4- 4  Ankle inversion    Ankle eversion    (Blank rows = not tested)  GAIT: Gait pattern:  smaller step length evident when getting up from a seat, improved continuity as he gets going Assistive device utilized: Single point cane Level of assistance: Complete Independence   FUNCTIONAL TESTs:   [facility] PT Assessment - 09/29/21 0001       Mini-BESTest   Compensatory Stepping Correction - Forward Normal: Recovers independently with a single, large step (second realignement is allowed).    Compensatory Stepping Correction - Backward Normal: Recovers independently with a single, large step    Compensatory Stepping Correction - Left Lateral Severe: Falls, or cannot step    Compensatory Stepping Correction - Right Lateral Severe:  Falls, or cannot step    Stepping Corredtion Lateral - lowest score 0    Timed UP & GO with Dual Task Moderate: Dual Task affects either counting OR walking (>10%) when compared to the TUG without Dual Task.   see assessment 9/18   Mini-BEST total score 17                PATIENT EDUCATION: Education details: prognosis, POC, HEP Person educated: Patient Education method: Explanation, Demonstration, Tactile cues, Verbal cues, and Handouts Education comprehension: verbalized understanding and returned demonstration   HOME EXERCISE PROGRAM: Access Code: [email or web address] URL: [email or web address] Date: 09/22/2021 Prepared by: [facility]  Exercises - Sidelying Thoracic Rotation with  Open Book  - 1 x daily - 5 x weekly - 2 sets - 10 reps - Forward Backward Weight Shift with Counter Support  - 1 x daily - 5 x weekly - 2 sets - 10 reps - Standing Gastroc Stretch at Counter  - 1 x daily - 5 x weekly - 2-3 sets - 30 sec hold - Heel Toe Raises with Counter Support  - 1 x daily - 5 x weekly - 2 sets - 10 reps - Seated Isometric Hip Abduction with Resistance (No resistance, just seated step outs)  - 1 x daily - 5 x weekly - 2 sets - 10 reps  GOALS: Goals reviewed with patient? Yes  SHORT TERM GOALS: Target date: 10/13/2021  Patient to be independent with initial HEP. Baseline: HEP initiated Goal status: IN PROGRESS    LONG TERM GOALS: Target date: 11/03/2021  Patient to be independent with advanced HEP. Baseline: Not yet initiated  Goal status: IN PROGRESS  Patient to verbalize understanding of local Parkinson's Disease community resources including community fitness post D/C.   Baseline: Not yet initiated  Goal status: IN PROGRESS  Patient to improve MiniBestTest score to atleast 17-21 to decrease risk of falls.  Baseline: 17/28 09/29/2021 Goal status: IN PROGRESS  Patient to demonstrate <10% increase between TUG and TUG cognitive/manual to improve ability to dual task in home/community.  Baseline: 13.7% TUG cog Goal status: IN PROGRESS  Patient to report 60% improved ease in car transfers and supine>sit out of bed.  Baseline: Not yet initiated Goal status: IN PROGRESS  Patient to verbalize tips to reduce freezing/festination with gait and turns. Baseline: Not yet initiated  Goal status: IN PROGRESS      ASSESSMENT:  CLINICAL IMPRESSION: Assessed MiniBESTest today with pt scoring 17/28, indicating increased fall risk.  Pt and wife report improved ease of getting in and out of bed due to removing mattress foam topper.  Addressed pt's c/o difficulty getting in and out of car today with functional lower extremity strengthening exercises.  Pt fatigues with  step up activities, and <3 minutes of gait; added walking program to HEP to address.  Pt will continue to benefit from skilled PT to begin to address improved overall functional mobility and decreased fall risk.  OBJECTIVE IMPAIRMENTS Abnormal gait, decreased balance, decreased coordination, decreased endurance, decreased ROM, decreased strength, increased muscle spasms, impaired flexibility, and postural dysfunction.   ACTIVITY LIMITATIONS carrying, lifting, bending, sitting, standing, squatting, stairs, transfers, bed mobility, dressing, reach over head, and hygiene/grooming  PARTICIPATION LIMITATIONS: meal prep, cleaning, laundry, shopping, community activity, yard work, and church  PERSONAL FACTORS Age, Past/current experiences, Time since onset of injury/illness/exacerbation, and 3+ comorbidities: CAD, DM, HLD, HTN, prostate CA, CABG 2004  are also affecting patient's functional outcome.  REHAB POTENTIAL: Good  CLINICAL DECISION MAKING: Evolving/moderate complexity  EVALUATION COMPLEXITY: Moderate  PLAN: PT FREQUENCY:  2x/week for 3 weeks, 1x/week for 3 weeks  PT DURATION: other: see above  PLANNED INTERVENTIONS: Therapeutic exercises, Therapeutic activity, Neuromuscular re-education, Balance training, Gait training, Patient/Family education, Self Care, Joint mobilization, Stair training, Vestibular training, Canalith repositioning, Aquatic Therapy, Dry Needling, Electrical stimulation, Cryotherapy, Moist heat, Taping, Manual therapy, and Re-evaluation  PLAN FOR NEXT SESSION: Ask about walking program.  Reassess HEP, work on techniques to assist in improved ease with bed mobility and car transfers   [employer], PT 09/29/21 5:10 PM Phone: [phone] Fax: [facility] at [facility] [address] [place], [place] [address] Phone # [phone] Fax # [phone]

## 2021-09-30 NOTE — Therapy (Signed)
OUTPATIENT PHYSICAL THERAPY NEURO TREATMENT NOTE   Patient Name: Johnny Fox MRN: [id number] DOB:[birth year], 88 y.o., male Today's Date: 10/01/2021   PCP: Josetta Huddle, MD REFERRING PROVIDER: Tat, Eustace Quail, DO   PT End of Session - 10/01/21 1056     Visit Number 3    Number of Visits 10    Date for PT Re-Evaluation 11/03/21    Authorization Type Medicare/BCBS   KX   PT Start Time 1020    PT Stop Time 1058    PT Time Calculation (min) 38 min    Activity Tolerance Patient tolerated treatment well    Behavior During Therapy WFL for tasks assessed/performed               Past Medical History:  Diagnosis Date   Actinic keratoses    and seborrheic keratoses   Arrhythmia    afib postoperatively after CABG in 2004, No recurrence   Coronary artery disease    Diabetes mellitus without complication ([place])    diet controlled   Dyslipidemia    ED (erectile dysfunction)    Elevated PSA    2010, workup with Dr. Risa Grill   H/O prostate biopsy    Hypertension    Onychomycosis    Prostate cancer ([place])    PSA's run approx 7- 2011-2013   Right shoulder injury    july 2014, Dr. Lennette Bihari Supple   Tick bite of abdomen    2013, treated with 3wks of doxycycline, tick engorged 3.72m nodule left thyroid 4/14- seen on carotid u/s   Past Surgical History:  Procedure Laterality Date   [place] BILATERAL     CORONARY ARTERY BYPASS GRAFT  2004   Patient Active Problem List   Diagnosis Date Noted   Aortic aneurysm ([place] 03/26/2021   Abnormal gait 03/13/2020   Bronchitis 03/13/2020   Chin laceration 03/13/2020   Cough 03/13/2020   Cramp and spasm 03/13/2020   Disorder of penis 03/13/2020   Dizzy spells 03/13/2020   Dyspnea 03/13/2020   Edema of lower extremity 03/13/2020   Elevated PSA 03/13/2020   Encounter for general adult medical examination without abnormal findings 03/13/2020   Hearing loss in left ear 03/13/2020   History of  malignant neoplasm of prostate 03/13/2020   Impacted cerumen 03/13/2020   Low back pain 03/13/2020   Malignant tumor of prostate ([place] 03/13/2020   Neuropathy 03/13/2020   Orthostatic hypotension 03/13/2020   Other long term (current) drug therapy 03/13/2020   Other specified abnormal findings of blood chemistry 03/13/2020   Other sprain of right hip, initial encounter 03/13/2020   Paresthesia 03/13/2020   Parkinson's disease ([place] 03/13/2020   Paroxysmal atrial fibrillation ([place] 03/13/2020   Polyneuropathy due to type 2 diabetes mellitus ([place] 03/13/2020   Presence of aortocoronary bypass graft 03/13/2020   Sciatica, right side 03/13/2020   Shoulder joint pain 03/13/2020   Skin sensation disturbance 03/13/2020   Type 2 diabetes mellitus without complications ([place] 029/09/299  Upper respiratory infection 03/13/2020   Vitamin D deficiency 03/13/2020   Asymmetrical left sensorineural hearing loss 01/13/2017   Right leg numbness 01/14/2015   Thyroid nodule 01/14/2015   Coronary atherosclerosis of native coronary artery 02/15/2013   Essential hypertension, benign 02/15/2013   Pure hypercholesterolemia 02/15/2013   Fatigue 02/15/2013    ONSET DATE: couple months  REFERRING DIAG: Parkinson's disease  THERAPY DIAG:  Unsteadiness on feet  Other symptoms and signs involving the nervous system  Other abnormalities  of gait and mobility  Muscle weakness (generalized)  Rationale for Evaluation and Treatment Rehabilitation  SUBJECTIVE:                                                                                                                                                                                              SUBJECTIVE STATEMENT: Bed mobility without mattress topper is easier.  Pt accompanied by: self  PERTINENT HISTORY: CAD, DM, HLD, HTN, prostate CA, CABG 2004  PAIN:  Are you having pain? No  PRECAUTIONS: Fall  PATIENT GOALS make it easier to get in/out of  bed and the car  OBJECTIVE:      TODAY'S TREATMENT: 10/01/21 Activity Comments  Nustep L2 x 5 min Cueing for larger amplitude movements   open book stretch R/L 10x Cues to maintain knees together and encourage full spine rotation; c/o brief dizziness upon sitting up  fwd/back wt shift  Cueing to avoid bending at the trunk and instead encouraging ankle strategy  runner's stretch B Cueing for foot positioning to find comfortable position of stretch   heel/toe raise Cueing for increased amplitude   sitting lateral step outs Cueing to stomp to increase movement amplitude   standing wide and romberg on foam + green medball trunk rotation, romberg on foam paloff press with green medball  Posterior sway with paloff press; avoided overhead lift d/t c/o hx R shoulder pain   3 way hip 10x each Cues to maintain the same amplitude movement each time; chest upright    PATIENT EDUCATION: Education details: discussion on walking program- patient reports trying this a little bit; edu on PWR exercise class  Person educated: Patient Education method: Explanation, Demonstration, Tactile cues, Verbal cues, and Handouts Education comprehension: verbalized understanding and returned demonstration    --------------------------------------------------------------------------------------------------------- Objective measures from eval:   DIAGNOSTIC FINDINGS: none recent  COGNITION: Overall cognitive status: Within functional limits for tasks assessed   SENSATION: Reports neuropathy in B feet but denies N/T  COORDINATION: Alt pronation/supination with mild R dysmetria, alt toe tap intact, finger to nose with mild-mod dysmetria R UE  MUSCLE TONE: moderate B LE rigidity in knees and ankles   POSTURE: increased thoracic kyphosis  LOWER EXTREMITY ROM:     Active  Right Eval Left Eval  Hip flexion    Hip extension    Hip abduction    Hip adduction    Hip internal rotation    Hip external  rotation    Knee flexion    Knee extension    Ankle dorsiflexion 3 -4  Ankle plantarflexion    Ankle inversion    Ankle  eversion     (Blank rows = not tested)  LOWER EXTREMITY MMT:    MMT (in sitting) Right Eval Left Eval  Hip flexion 4+ 4  Hip extension    Hip abduction 4 4  Hip adduction 4 4  Hip internal rotation    Hip external rotation    Knee flexion 4+ 4+  Knee extension 4+ 4  Ankle dorsiflexion 4 4  Ankle plantarflexion 4- 4  Ankle inversion    Ankle eversion    (Blank rows = not tested)  GAIT: Gait pattern:  smaller step length evident when getting up from a seat, improved continuity as he gets going Assistive device utilized: Single point cane Level of assistance: Complete Independence   FUNCTIONAL TESTs:        PATIENT EDUCATION: Education details: prognosis, POC, HEP Person educated: Patient Education method: Explanation, Demonstration, Tactile cues, Verbal cues, and Handouts Education comprehension: verbalized understanding and returned demonstration   HOME EXERCISE PROGRAM: Access Code: [email or web address] URL: [email or web address] Date: 09/22/2021 Prepared by: [facility]  Exercises - Sidelying Thoracic Rotation with Open Book  - 1 x daily - 5 x weekly - 2 sets - 10 reps - Forward Backward Weight Shift with Counter Support  - 1 x daily - 5 x weekly - 2 sets - 10 reps - Standing Gastroc Stretch at Counter  - 1 x daily - 5 x weekly - 2-3 sets - 30 sec hold - Heel Toe Raises with Counter Support  - 1 x daily - 5 x weekly - 2 sets - 10 reps - Seated Isometric Hip Abduction with Resistance (No resistance, just seated step outs)  - 1 x daily - 5 x weekly - 2 sets - 10 reps  GOALS: Goals reviewed with patient? Yes  SHORT TERM GOALS: Target date: 10/13/2021  Patient to be independent with initial HEP. Baseline: HEP initiated Goal status: MET 10/01/21    LONG TERM GOALS: Target date:  11/03/2021  Patient to be independent with advanced HEP. Baseline: Not yet initiated  Goal status: IN PROGRESS  Patient to verbalize understanding of local Parkinson's Disease community resources including community fitness post D/C.   Baseline: Not yet initiated  Goal status: IN PROGRESS  Patient to improve MiniBestTest score to atleast 17-21 to decrease risk of falls.  Baseline: 17/28 09/29/2021 Goal status: IN PROGRESS  Patient to demonstrate <10% increase between TUG and TUG cognitive/manual to improve ability to dual task in home/community.  Baseline: 13.7% TUG cog Goal status: IN PROGRESS  Patient to report 60% improved ease in car transfers and supine>sit out of bed.  Baseline: Not yet initiated Goal status: IN PROGRESS  Patient to verbalize tips to reduce freezing/festination with gait and turns. Baseline: Not yet initiated  Goal status: IN PROGRESS      ASSESSMENT:  CLINICAL IMPRESSION: Patient arrived to session with report of improved ease of bed mobility. Reviewed HEP with cueing to correct form and positioning. Patient required correction of form with standing weight shifts. Also worked on balance activities on compliant surface, with posterior sway evident. Focused on encouraging high amplitude movements throughout. Patient reported fatigue at end of session but otherwise tolerated well.   OBJECTIVE IMPAIRMENTS Abnormal gait, decreased balance, decreased coordination, decreased endurance, decreased ROM, decreased strength, increased muscle spasms, impaired flexibility, and postural dysfunction.   ACTIVITY LIMITATIONS carrying, lifting, bending, sitting, standing, squatting, stairs, transfers, bed mobility, dressing, reach over head, and hygiene/grooming  PARTICIPATION LIMITATIONS: meal prep,  cleaning, laundry, shopping, community activity, yard work, and church  PERSONAL FACTORS Age, Past/current experiences, Time since onset of injury/illness/exacerbation, and  3+ comorbidities: CAD, DM, HLD, HTN, prostate CA, CABG 2004  are also affecting patient's functional outcome.   REHAB POTENTIAL: Good  CLINICAL DECISION MAKING: Evolving/moderate complexity  EVALUATION COMPLEXITY: Moderate  PLAN: PT FREQUENCY:  2x/week for 3 weeks, 1x/week for 3 weeks  PT DURATION: other: see above  PLANNED INTERVENTIONS: Therapeutic exercises, Therapeutic activity, Neuromuscular re-education, Balance training, Gait training, Patient/Family education, Self Care, Joint mobilization, Stair training, Vestibular training, Canalith repositioning, Aquatic Therapy, Dry Needling, Electrical stimulation, Cryotherapy, Moist heat, Taping, Manual therapy, and Re-evaluation  PLAN FOR NEXT SESSION: Reassess HEP, work on techniques to assist in improved ease with bed mobility and car transfers    Janene Harvey, PT, DPT 10/01/21 11:02 AM  [place] at [facility] [address] [place], [place] [address] Phone # [phone] Fax # [phone]

## 2021-10-01 ENCOUNTER — Ambulatory Visit: Payer: Medicare Other | Admitting: Physical Therapy

## 2021-10-01 ENCOUNTER — Encounter: Payer: Self-pay | Admitting: Physical Therapy

## 2021-10-01 DIAGNOSIS — R2681 Unsteadiness on feet: Secondary | ICD-10-CM | POA: Diagnosis not present

## 2021-10-01 DIAGNOSIS — R2689 Other abnormalities of gait and mobility: Secondary | ICD-10-CM | POA: Diagnosis not present

## 2021-10-01 DIAGNOSIS — R29818 Other symptoms and signs involving the nervous system: Secondary | ICD-10-CM

## 2021-10-01 DIAGNOSIS — G20 Parkinson's disease: Secondary | ICD-10-CM | POA: Diagnosis not present

## 2021-10-01 DIAGNOSIS — M6281 Muscle weakness (generalized): Secondary | ICD-10-CM | POA: Diagnosis not present

## 2021-10-03 DIAGNOSIS — Z23 Encounter for immunization: Secondary | ICD-10-CM | POA: Diagnosis not present

## 2021-10-06 ENCOUNTER — Encounter: Payer: Self-pay | Admitting: Physical Therapy

## 2021-10-06 ENCOUNTER — Ambulatory Visit: Payer: Medicare Other | Attending: Neurology | Admitting: Physical Therapy

## 2021-10-06 DIAGNOSIS — R29818 Other symptoms and signs involving the nervous system: Secondary | ICD-10-CM | POA: Insufficient documentation

## 2021-10-06 DIAGNOSIS — R2681 Unsteadiness on feet: Secondary | ICD-10-CM | POA: Diagnosis not present

## 2021-10-06 DIAGNOSIS — M6281 Muscle weakness (generalized): Secondary | ICD-10-CM | POA: Diagnosis not present

## 2021-10-06 DIAGNOSIS — R2689 Other abnormalities of gait and mobility: Secondary | ICD-10-CM | POA: Insufficient documentation

## 2021-10-06 NOTE — Therapy (Signed)
OUTPATIENT PHYSICAL THERAPY NEURO TREATMENT NOTE   Patient Name: Johnny Fox MRN: [id number] DOB:17-Jan-1928, 88 y.o., male Today's Date: 10/06/2021   PCP: Josetta Huddle, MD REFERRING PROVIDER: Tat, Eustace Quail, DO   PT End of Session - 10/06/21 0926     Visit Number 4    Number of Visits 10    Date for PT Re-Evaluation 11/03/21    Authorization Type Medicare/BCBS   KX   PT Start Time 0930    PT Stop Time 1012    PT Time Calculation (min) 42 min    Equipment Utilized During Treatment Gait belt    Activity Tolerance Patient tolerated treatment well    Behavior During Therapy WFL for tasks assessed/performed                Past Medical History:  Diagnosis Date   Actinic keratoses    and seborrheic keratoses   Arrhythmia    afib postoperatively after CABG in 2004, No recurrence   Coronary artery disease    Diabetes mellitus without complication ([place])    diet controlled   Dyslipidemia    ED (erectile dysfunction)    Elevated PSA    2010, workup with Dr. Risa Grill   H/O prostate biopsy    Hypertension    Onychomycosis    Prostate cancer ([place])    PSA's run approx 7- 2011-2013   Right shoulder injury    july 2014, Dr. Lennette Bihari Supple   Tick bite of abdomen    2013, treated with 3wks of doxycycline, tick engorged 3.12mm nodule left thyroid 4/14- seen on carotid u/s   Past Surgical History:  Procedure Laterality Date   [place], BILATERAL     CORONARY ARTERY BYPASS GRAFT  2004   Patient Active Problem List   Diagnosis Date Noted   Aortic aneurysm ([place]) 03/26/2021   Abnormal gait 03/13/2020   Bronchitis 03/13/2020   Chin laceration 03/13/2020   Cough 03/13/2020   Cramp and spasm 03/13/2020   Disorder of penis 03/13/2020   Dizzy spells 03/13/2020   Dyspnea 03/13/2020   Edema of lower extremity 03/13/2020   Elevated PSA 03/13/2020   Encounter for general adult medical examination without abnormal findings 03/13/2020    Hearing loss in left ear 03/13/2020   History of malignant neoplasm of prostate 03/13/2020   Impacted cerumen 03/13/2020   Low back pain 03/13/2020   Malignant tumor of prostate ([place]) 03/13/2020   Neuropathy 03/13/2020   Orthostatic hypotension 03/13/2020   Other long term (current) drug therapy 03/13/2020   Other specified abnormal findings of blood chemistry 03/13/2020   Other sprain of right hip, initial encounter 03/13/2020   Paresthesia 03/13/2020   Parkinson's disease 03/13/2020   Paroxysmal atrial fibrillation ([place]) 03/13/2020   Polyneuropathy due to type 2 diabetes mellitus ([place]) 03/13/2020   Presence of aortocoronary bypass graft 03/13/2020   Sciatica, right side 03/13/2020   Shoulder joint pain 03/13/2020   Skin sensation disturbance 03/13/2020   Type 2 diabetes mellitus without complications ([place]) 32/20/2542   Upper respiratory infection 03/13/2020   Vitamin D deficiency 03/13/2020   Asymmetrical left sensorineural hearing loss 01/13/2017   Right leg numbness 01/14/2015   Thyroid nodule 01/14/2015   Coronary atherosclerosis of native coronary artery 02/15/2013   Essential hypertension, benign 02/15/2013   Pure hypercholesterolemia 02/15/2013   Fatigue 02/15/2013    ONSET DATE: couple months  REFERRING DIAG: Parkinson's disease  THERAPY DIAG:  Unsteadiness on feet  Other abnormalities  of gait and mobility  Muscle weakness (generalized)  Other symptoms and signs involving the nervous system  Rationale for Evaluation and Treatment Rehabilitation  SUBJECTIVE:                                                                                                                                                                                              SUBJECTIVE STATEMENT: Exercises going okay.  Just stay tired all the time.  Thought about the exercise class Erskine Squibb mentioned, but I don't want to add one more day.  Pt accompanied by: self  PERTINENT HISTORY: CAD, DM,  HLD, HTN, prostate CA, CABG 2004  PAIN:  Are you having pain? No  PRECAUTIONS: Fall  PATIENT GOALS make it easier to get in/out of bed and the car  OBJECTIVE:    TODAY'S TREATMENT: 10/06/2021 Activity Comments  NuStep, Level 3, 4 extremities x 6 minutes SPM mid 75-80, for warm up  Seated hamstring stretch, 3 x 30 sec Cues for technique  Standing wide BOS with ant/posterior weightshift x 5 reps for hamstring stretch Cues for technique  Stagger stance forward/back weightshifting, 10 reps each foot position   Wide BOS lateral rocking>rock and reach>rock and reach with looking to each side Lateral reach versus overhead due to R shoulder pain  Side step and weightshift, x 10; forward step and weightshift x 10; back step and weightshift x 10 Intermittent UE support, mild LOB with forward stepping  Forward/back step and weightshift x 10 reps 1 UE support  Standing on balance beam :  ant/post weightshifts for ankle/hip strategy, with UE support 2 x 10; marching in place x 10 reps; forward>back step and weightshift x 10 reps UE support Cues for increased step length/foot clearance.  Work on limits of stability with ant/post weightshifts, with pt demo increased posterior lean  Forward/back waking at parallel bars x 5 reps 1 UE support  Monster walk, forward/back x 4 reps with 1 UE support   Short distance gait with cane with head turns/nods min guard assist/min assist 1 LOB to L side, with head turns, needing therapist assist to regain balance.     --------------------------------------------------------------------------------------------------------- Objective measures from eval:   DIAGNOSTIC FINDINGS: none recent  COGNITION: Overall cognitive status: Within functional limits for tasks assessed   SENSATION: Reports neuropathy in B feet but denies N/T  COORDINATION: Alt pronation/supination with mild R dysmetria, alt toe tap intact, finger to nose with mild-mod dysmetria R UE  MUSCLE  TONE: moderate B LE rigidity in knees and ankles   POSTURE: increased thoracic kyphosis  LOWER EXTREMITY ROM:     Active  Right Eval Left Eval  Hip flexion    Hip extension    Hip abduction    Hip adduction    Hip internal rotation    Hip external rotation    Knee flexion    Knee extension    Ankle dorsiflexion 3 -4  Ankle plantarflexion    Ankle inversion    Ankle eversion     (Blank rows = not tested)  LOWER EXTREMITY MMT:    MMT (in sitting) Right Eval Left Eval  Hip flexion 4+ 4  Hip extension    Hip abduction 4 4  Hip adduction 4 4  Hip internal rotation    Hip external rotation    Knee flexion 4+ 4+  Knee extension 4+ 4  Ankle dorsiflexion 4 4  Ankle plantarflexion 4- 4  Ankle inversion    Ankle eversion    (Blank rows = not tested)  GAIT: Gait pattern:  smaller step length evident when getting up from a seat, improved continuity as he gets going Assistive device utilized: Single point cane Level of assistance: Complete Independence   FUNCTIONAL TESTs:        PATIENT EDUCATION: Education details: prognosis, POC, HEP Person educated: Patient Education method: Explanation, Demonstration, Tactile cues, Verbal cues, and Handouts Education comprehension: verbalized understanding and returned demonstration   HOME EXERCISE PROGRAM: Access Code: [email or web address] URL: [email or web address] Date: 09/22/2021 Prepared by: [facility]  Exercises - Sidelying Thoracic Rotation with Open Book  - 1 x daily - 5 x weekly - 2 sets - 10 reps - Forward Backward Weight Shift with Counter Support  - 1 x daily - 5 x weekly - 2 sets - 10 reps - Standing Gastroc Stretch at Counter  - 1 x daily - 5 x weekly - 2-3 sets - 30 sec hold - Heel Toe Raises with Counter Support  - 1 x daily - 5 x weekly - 2 sets - 10 reps - Seated Isometric Hip Abduction with Resistance (No resistance, just seated step outs)  - 1 x daily - 5 x  weekly - 2 sets - 10 reps  GOALS: Goals reviewed with patient? Yes  SHORT TERM GOALS: Target date: 10/13/2021  Patient to be independent with initial HEP. Baseline: HEP initiated Goal status: MET 10/01/21    LONG TERM GOALS: Target date: 11/03/2021  Patient to be independent with advanced HEP. Baseline: Not yet initiated  Goal status: IN PROGRESS  Patient to verbalize understanding of local Parkinson's Disease community resources including community fitness post D/C.   Baseline: Not yet initiated  Goal status: IN PROGRESS  Patient to improve MiniBestTest score to atleast 17-21 to decrease risk of falls.  Baseline: 17/28 09/29/2021 Goal status: IN PROGRESS  Patient to demonstrate <10% increase between TUG and TUG cognitive/manual to improve ability to dual task in home/community.  Baseline: 13.7% TUG cog Goal status: IN PROGRESS  Patient to report 60% improved ease in car transfers and supine>sit out of bed.  Baseline: Not yet initiated Goal status: IN PROGRESS  Patient to verbalize tips to reduce freezing/festination with gait and turns. Baseline: Not yet initiated  Goal status: IN PROGRESS      ASSESSMENT:  CLINICAL IMPRESSION: Skilled PT session today focused on strength/flexibility and balance.  Worked on weightshifting to address limits of stability; with pt continueing to have increased posterior bias.  Also worked on step strategy in varied directions, with pt needing at least 1 UE support for stability.  With dynamic balance outside of parallel bars incorporating head turns with gait, pt has significant lateral LOB, needing therapist assist to regain balance.  He will continue to benefit from skilled PT towards goals for improved balance and decreased fall risk.   OBJECTIVE IMPAIRMENTS Abnormal gait, decreased balance, decreased coordination, decreased endurance, decreased ROM, decreased strength, increased muscle spasms, impaired flexibility, and postural  dysfunction.   ACTIVITY LIMITATIONS carrying, lifting, bending, sitting, standing, squatting, stairs, transfers, bed mobility, dressing, reach over head, and hygiene/grooming  PARTICIPATION LIMITATIONS: meal prep, cleaning, laundry, shopping, community activity, yard work, and church  PERSONAL FACTORS Age, Past/current experiences, Time since onset of injury/illness/exacerbation, and 3+ comorbidities: CAD, DM, HLD, HTN, prostate CA, CABG 2004  are also affecting patient's functional outcome.   REHAB POTENTIAL: Good  CLINICAL DECISION MAKING: Evolving/moderate complexity  EVALUATION COMPLEXITY: Moderate  PLAN: PT FREQUENCY:  2x/week for 3 weeks, 1x/week for 3 weeks  PT DURATION: other: see above  PLANNED INTERVENTIONS: Therapeutic exercises, Therapeutic activity, Neuromuscular re-education, Balance training, Gait training, Patient/Family education, Self Care, Joint mobilization, Stair training, Vestibular training, Canalith repositioning, Aquatic Therapy, Dry Needling, Electrical stimulation, Cryotherapy, Moist heat, Taping, Manual therapy, and Re-evaluation  PLAN FOR NEXT SESSION: Work on balance-hip/ankle/step strategy work, compliant surfaces, dynamic balance.  Work on techniques to assist in improved ease with bed mobility and car transfers as needed.   Mady Haagensen, PT 10/06/21 10:17 AM Phone: [phone] Fax: [phone]   [facility] at [facility] [place], [place], [place] [address] Phone # [phone] Fax # [phone]

## 2021-10-09 NOTE — Therapy (Signed)
OUTPATIENT PHYSICAL THERAPY NEURO TREATMENT NOTE   Patient Name: Johnny Fox MRN: [id number] DOB:February 28, 1928, 88 y.o., male Today's Date: 10/10/2021   PCP: Josetta Huddle, MD REFERRING PROVIDER: Tat, Eustace Quail, DO   PT End of Session - 10/10/21 1014     Visit Number 5    Number of Visits 10    Date for PT Re-Evaluation 11/03/21    Authorization Type Medicare/BCBS   KX   PT Start Time 0927    PT Stop Time 1011    PT Time Calculation (min) 44 min    Equipment Utilized During Treatment Gait belt    Activity Tolerance Patient tolerated treatment well;Patient limited by fatigue    Behavior During Therapy WFL for tasks assessed/performed                 Past Medical History:  Diagnosis Date   Actinic keratoses    and seborrheic keratoses   Arrhythmia    afib postoperatively after CABG in 2004, No recurrence   Coronary artery disease    Diabetes mellitus without complication ([place])    diet controlled   Dyslipidemia    ED (erectile dysfunction)    Elevated PSA    2010, workup with Dr. Risa Grill   H/O prostate biopsy    Hypertension    Onychomycosis    Prostate cancer ([place])    PSA's run approx 7- 2011-2013   Right shoulder injury    july 2014, Dr. Lennette Bihari Supple   Tick bite of abdomen    2013, treated with 3wks of doxycycline, tick engorged 3.61mm nodule left thyroid 4/14- seen on carotid u/s   Past Surgical History:  Procedure Laterality Date   [place], BILATERAL     CORONARY ARTERY BYPASS GRAFT  2004   Patient Active Problem List   Diagnosis Date Noted   Aortic aneurysm ([place]) 03/26/2021   Abnormal gait 03/13/2020   Bronchitis 03/13/2020   Chin laceration 03/13/2020   Cough 03/13/2020   Cramp and spasm 03/13/2020   Disorder of penis 03/13/2020   Dizzy spells 03/13/2020   Dyspnea 03/13/2020   Edema of lower extremity 03/13/2020   Elevated PSA 03/13/2020   Encounter for general adult medical examination without abnormal  findings 03/13/2020   Hearing loss in left ear 03/13/2020   History of malignant neoplasm of prostate 03/13/2020   Impacted cerumen 03/13/2020   Low back pain 03/13/2020   Malignant tumor of prostate ([place]) 03/13/2020   Neuropathy 03/13/2020   Orthostatic hypotension 03/13/2020   Other long term (current) drug therapy 03/13/2020   Other specified abnormal findings of blood chemistry 03/13/2020   Other sprain of right hip, initial encounter 03/13/2020   Paresthesia 03/13/2020   Parkinson's disease 03/13/2020   Paroxysmal atrial fibrillation ([place]) 03/13/2020   Polyneuropathy due to type 2 diabetes mellitus ([place]) 03/13/2020   Presence of aortocoronary bypass graft 03/13/2020   Sciatica, right side 03/13/2020   Shoulder joint pain 03/13/2020   Skin sensation disturbance 03/13/2020   Type 2 diabetes mellitus without complications ([place]) 24/58/0998   Upper respiratory infection 03/13/2020   Vitamin D deficiency 03/13/2020   Asymmetrical left sensorineural hearing loss 01/13/2017   Right leg numbness 01/14/2015   Thyroid nodule 01/14/2015   Coronary atherosclerosis of native coronary artery 02/15/2013   Essential hypertension, benign 02/15/2013   Pure hypercholesterolemia 02/15/2013   Fatigue 02/15/2013    ONSET DATE: couple months  REFERRING DIAG: Parkinson's disease  THERAPY DIAG:  Unsteadiness on  feet  Other abnormalities of gait and mobility  Muscle weakness (generalized)  Other symptoms and signs involving the nervous system  Rationale for Evaluation and Treatment Rehabilitation  SUBJECTIVE:                                                                                                                                                                                              SUBJECTIVE STATEMENT: Have been tired. "I'm always tired."  Pt accompanied by: self  PERTINENT HISTORY: CAD, DM, HLD, HTN, prostate CA, CABG 2004  PAIN:  Are you having pain?  No  PRECAUTIONS: Fall  PATIENT GOALS make it easier to get in/out of bed and the car  OBJECTIVE:     TODAY'S TREATMENT: 10/10/21 Activity Comments  Nustep L3 x 6 min  Maintaining 80s SPM  resisted walking with pulley 15# backwards 10x, 15# sideways 3x each Required cues to increase step length forwards and backwards and increase eccentric control; 4x LOB with self-correction/CGA particularly with L sidestepping   staggered stance + fwd/back wt shifts in front of mirror Intermittent chair support and CGA-min A for balance; limited amplitude and hesitancy   wide stance lateral weight shift + lateral reach in front of mirror Good carryover of cueing  fwd/back stepping R/L 10x each Good stability with CGA; pt reporting hesitancy     PATIENT EDUCATION: Education details: discussed patient's fatigue levels and encouraged this discussion with PCP or cardiology  Person educated: Patient Education method: Explanation, Demonstration, Tactile cues, and Verbal cues Education comprehension: verbalized understanding and returned demonstration   --------------------------------------------------------------------------------------------------------- Objective measures from eval:   DIAGNOSTIC FINDINGS: none recent  COGNITION: Overall cognitive status: Within functional limits for tasks assessed   SENSATION: Reports neuropathy in B feet but denies N/T  COORDINATION: Alt pronation/supination with mild R dysmetria, alt toe tap intact, finger to nose with mild-mod dysmetria R UE  MUSCLE TONE: moderate B LE rigidity in knees and ankles   POSTURE: increased thoracic kyphosis  LOWER EXTREMITY ROM:     Active  Right Eval Left Eval  Hip flexion    Hip extension    Hip abduction    Hip adduction    Hip internal rotation    Hip external rotation    Knee flexion    Knee extension    Ankle dorsiflexion 3 -4  Ankle plantarflexion    Ankle inversion    Ankle eversion     (Blank rows =  not tested)  LOWER EXTREMITY MMT:    MMT (in sitting) Right Eval Left Eval  Hip flexion 4+ 4  Hip extension    Hip  abduction 4 4  Hip adduction 4 4  Hip internal rotation    Hip external rotation    Knee flexion 4+ 4+  Knee extension 4+ 4  Ankle dorsiflexion 4 4  Ankle plantarflexion 4- 4  Ankle inversion    Ankle eversion    (Blank rows = not tested)  GAIT: Gait pattern:  smaller step length evident when getting up from a seat, improved continuity as he gets going Assistive device utilized: Single point cane Level of assistance: Complete Independence   FUNCTIONAL TESTs:        PATIENT EDUCATION: Education details: prognosis, POC, HEP Person educated: Patient Education method: Explanation, Demonstration, Tactile cues, Verbal cues, and Handouts Education comprehension: verbalized understanding and returned demonstration   HOME EXERCISE PROGRAM: Access Code: [email or web address] URL: [email or web address] Date: 09/22/2021 Prepared by: [facility]  Exercises - Sidelying Thoracic Rotation with Open Book  - 1 x daily - 5 x weekly - 2 sets - 10 reps - Forward Backward Weight Shift with Counter Support  - 1 x daily - 5 x weekly - 2 sets - 10 reps - Standing Gastroc Stretch at Counter  - 1 x daily - 5 x weekly - 2-3 sets - 30 sec hold - Heel Toe Raises with Counter Support  - 1 x daily - 5 x weekly - 2 sets - 10 reps - Seated Isometric Hip Abduction with Resistance (No resistance, just seated step outs)  - 1 x daily - 5 x weekly - 2 sets - 10 reps  GOALS: Goals reviewed with patient? Yes  SHORT TERM GOALS: Target date: 10/13/2021  Patient to be independent with initial HEP. Baseline: HEP initiated Goal status: MET 10/01/21    LONG TERM GOALS: Target date: 11/03/2021  Patient to be independent with advanced HEP. Baseline: Not yet initiated  Goal status: IN PROGRESS  Patient to verbalize understanding of local  Parkinson's Disease community resources including community fitness post D/C.   Baseline: Not yet initiated  Goal status: IN PROGRESS  Patient to improve MiniBestTest score to atleast 17-21 to decrease risk of falls.  Baseline: 17/28 09/29/2021 Goal status: IN PROGRESS  Patient to demonstrate <10% increase between TUG and TUG cognitive/manual to improve ability to dual task in home/community.  Baseline: 13.7% TUG cog Goal status: IN PROGRESS  Patient to report 60% improved ease in car transfers and supine>sit out of bed.  Baseline: Not yet initiated Goal status: IN PROGRESS  Patient to verbalize tips to reduce freezing/festination with gait and turns. Baseline: Not yet initiated  Goal status: IN PROGRESS      ASSESSMENT:  CLINICAL IMPRESSION: Patient arrived to session with report of fatigue. Worked on resisted walking for balance and reactive stepping. Patient required cues to elongate steps and demonstrate improved eccentric control. Sitting rest break required to address fatigue. Dynamic stepping and weight shifting activities revealed some hesitancy but overall with good effort to amplify movements and fair stability evident. Patient tolerated session well despite fatigue. No complaints at end of session.  OBJECTIVE IMPAIRMENTS Abnormal gait, decreased balance, decreased coordination, decreased endurance, decreased ROM, decreased strength, increased muscle spasms, impaired flexibility, and postural dysfunction.   ACTIVITY LIMITATIONS carrying, lifting, bending, sitting, standing, squatting, stairs, transfers, bed mobility, dressing, reach over head, and hygiene/grooming  PARTICIPATION LIMITATIONS: meal prep, cleaning, laundry, shopping, community activity, yard work, and church  PERSONAL FACTORS Age, Past/current experiences, Time since onset of injury/illness/exacerbation, and 3+ comorbidities: CAD, DM, HLD, HTN, prostate CA,  CABG 2004  are also affecting patient's functional  outcome.   REHAB POTENTIAL: Good  CLINICAL DECISION MAKING: Evolving/moderate complexity  EVALUATION COMPLEXITY: Moderate  PLAN: PT FREQUENCY:  2x/week for 3 weeks, 1x/week for 3 weeks  PT DURATION: other: see above  PLANNED INTERVENTIONS: Therapeutic exercises, Therapeutic activity, Neuromuscular re-education, Balance training, Gait training, Patient/Family education, Self Care, Joint mobilization, Stair training, Vestibular training, Canalith repositioning, Aquatic Therapy, Dry Needling, Electrical stimulation, Cryotherapy, Moist heat, Taping, Manual therapy, and Re-evaluation  PLAN FOR NEXT SESSION: Work on balance-hip/ankle/step strategy work, compliant surfaces, dynamic balance.  Work on techniques to assist in improved ease with bed mobility and car transfers as needed.    Janene Harvey, PT, DPT 10/10/21 10:16 AM  [place] [facility] at [facility] [place], [place], [place] [address] Phone # [phone] Fax # [phone]

## 2021-10-10 ENCOUNTER — Ambulatory Visit: Payer: Medicare Other | Admitting: Physical Therapy

## 2021-10-10 ENCOUNTER — Encounter: Payer: Self-pay | Admitting: Physical Therapy

## 2021-10-10 DIAGNOSIS — R2689 Other abnormalities of gait and mobility: Secondary | ICD-10-CM | POA: Diagnosis not present

## 2021-10-10 DIAGNOSIS — R2681 Unsteadiness on feet: Secondary | ICD-10-CM

## 2021-10-10 DIAGNOSIS — M6281 Muscle weakness (generalized): Secondary | ICD-10-CM | POA: Diagnosis not present

## 2021-10-10 DIAGNOSIS — R29818 Other symptoms and signs involving the nervous system: Secondary | ICD-10-CM

## 2021-10-13 ENCOUNTER — Ambulatory Visit: Payer: Medicare Other | Admitting: Physical Therapy

## 2021-10-13 ENCOUNTER — Encounter: Payer: Self-pay | Admitting: Physical Therapy

## 2021-10-13 DIAGNOSIS — M6281 Muscle weakness (generalized): Secondary | ICD-10-CM

## 2021-10-13 DIAGNOSIS — R2689 Other abnormalities of gait and mobility: Secondary | ICD-10-CM | POA: Diagnosis not present

## 2021-10-13 DIAGNOSIS — R2681 Unsteadiness on feet: Secondary | ICD-10-CM

## 2021-10-13 DIAGNOSIS — R29818 Other symptoms and signs involving the nervous system: Secondary | ICD-10-CM | POA: Diagnosis not present

## 2021-10-13 NOTE — Therapy (Signed)
OUTPATIENT PHYSICAL THERAPY NEURO TREATMENT NOTE   Patient Name: Johnny Fox MRN: [id number] DOB:04/02/1928, 88 y.o., male Today's Date: 10/13/2021   PCP: Josetta Huddle, MD REFERRING PROVIDER: Tat, Eustace Quail, DO   PT End of Session - 10/13/21 0929     Visit Number 6    Number of Visits 10    Date for PT Re-Evaluation 11/03/21    Authorization Type Medicare/BCBS   KX   PT Start Time 0933    PT Stop Time 1012    PT Time Calculation (min) 39 min    Equipment Utilized During Treatment --    Activity Tolerance Patient tolerated treatment well;Patient limited by fatigue    Behavior During Therapy [facility] for tasks assessed/performed                  Past Medical History:  Diagnosis Date   Actinic keratoses    and seborrheic keratoses   Arrhythmia    afib postoperatively after CABG in 2004, No recurrence   Coronary artery disease    Diabetes mellitus without complication ([place])    diet controlled   Dyslipidemia    ED (erectile dysfunction)    Elevated PSA    2010, workup with Dr. Risa Grill   H/O prostate biopsy    Hypertension    Onychomycosis    Prostate cancer ([place])    PSA's run approx 7- 2011-2013   Right shoulder injury    july 2014, Dr. Lennette Bihari Supple   Tick bite of abdomen    2013, treated with 3wks of doxycycline, tick engorged 3.67m nodule left thyroid 4/14- seen on carotid u/s   Past Surgical History:  Procedure Laterality Date   [place] BILATERAL     CORONARY ARTERY BYPASS GRAFT  2004   Patient Active Problem List   Diagnosis Date Noted   Aortic aneurysm ([facility] 03/26/2021   Abnormal gait 03/13/2020   Bronchitis 03/13/2020   Chin laceration 03/13/2020   Cough 03/13/2020   Cramp and spasm 03/13/2020   Disorder of penis 03/13/2020   Dizzy spells 03/13/2020   Dyspnea 03/13/2020   Edema of lower extremity 03/13/2020   Elevated PSA 03/13/2020   Encounter for general adult medical examination without abnormal  findings 03/13/2020   Hearing loss in left ear 03/13/2020   History of malignant neoplasm of prostate 03/13/2020   Impacted cerumen 03/13/2020   Low back pain 03/13/2020   Malignant tumor of prostate ([facility] 03/13/2020   Neuropathy 03/13/2020   Orthostatic hypotension 03/13/2020   Other long term (current) drug therapy 03/13/2020   Other specified abnormal findings of blood chemistry 03/13/2020   Other sprain of right hip, initial encounter 03/13/2020   Paresthesia 03/13/2020   Parkinson's disease 03/13/2020   Paroxysmal atrial fibrillation ([place] 03/13/2020   Polyneuropathy due to type 2 diabetes mellitus ([place] 03/13/2020   Presence of aortocoronary bypass graft 03/13/2020   Sciatica, right side 03/13/2020   Shoulder joint pain 03/13/2020   Skin sensation disturbance 03/13/2020   Type 2 diabetes mellitus without complications ([place] 062/86/3817  Upper respiratory infection 03/13/2020   Vitamin D deficiency 03/13/2020   Asymmetrical left sensorineural hearing loss 01/13/2017   Right leg numbness 01/14/2015   Thyroid nodule 01/14/2015   Coronary atherosclerosis of native coronary artery 02/15/2013   Essential hypertension, benign 02/15/2013   Pure hypercholesterolemia 02/15/2013   Fatigue 02/15/2013    ONSET DATE: couple months  REFERRING DIAG: Parkinson's disease  THERAPY DIAG:  Unsteadiness on  feet  Other abnormalities of gait and mobility  Muscle weakness (generalized)  Rationale for Evaluation and Treatment Rehabilitation  SUBJECTIVE:                                                                                                                                                                                              SUBJECTIVE STATEMENT: Does running or walking fast help the slower movement patterns? Pt accompanied by: self  PERTINENT HISTORY: CAD, DM, HLD, HTN, prostate CA, CABG 2004  PAIN:  Are you having pain? No  PRECAUTIONS: Fall  PATIENT GOALS make  it easier to get in/out of bed and the car  OBJECTIVE:    TODAY'S TREATMENT: 10/13/2021 Activity Comments  Nustep L3 x 6 min  Cues to Maintain 80s SPM (self-selected pace is 70-75 SPM)  Review of HEP: Seated step out/in Standing gastroc stretch  Standing heel/toe raises    Step over obstacles, side direction, x 12 reps, forward direxion x 10 reps   Gait training 255 ft with cane, supervision Good form, good stride length, narrow BOS at times  Gait training 255 ft with no device, supervision, then 200 ft Initial cues for stride length, arm swing; good form  Forward/back walking along counter, 5 reps    Access Code: [email or web address] URL: [email or web address] Date: 10/13/2021-most recent addition Prepared by: [facility]  Exercises - Sidelying Thoracic Rotation with Open Book  - 1 x daily - 5 x weekly - 2 sets - 10 reps - Forward Backward Weight Shift with Counter Support  - 1 x daily - 5 x weekly - 2 sets - 10 reps - Standing Gastroc Stretch at Counter  - 1 x daily - 5 x weekly - 2-3 sets - 30 sec hold - Heel Toe Raises with Counter Support  - 1 x daily - 5 x weekly - 2 sets - 10 reps - Seated Isometric Hip Abduction with Resistance  - 1 x daily - 5 x weekly - 2 sets - 10 reps - Forward Step Over with Counter Support  - 1 x daily - 5 x weekly - 1-2 sets - 10 reps - Side Stepping with Counter Support  - 1 x daily - 5 x weekly - 1-2 sets - 10 reps  PATIENT EDUCATION: Education details: Discussed effort level, increased attention to bigger, more deliberate effort, rating effort level; additions to HEP Person educated: Patient and Spouse Education method: Explanation, Demonstration, and Handouts Education comprehension: verbalized understanding, returned demonstration, and needs further education     --------------------------------------------------------------------------------------------------------- Objective measures from eval:    DIAGNOSTIC  FINDINGS: none recent  COGNITION: Overall cognitive status: Within functional limits for tasks assessed   SENSATION: Reports neuropathy in B feet but denies N/T  COORDINATION: Alt pronation/supination with mild R dysmetria, alt toe tap intact, finger to nose with mild-mod dysmetria R UE  MUSCLE TONE: moderate B LE rigidity in knees and ankles   POSTURE: increased thoracic kyphosis  LOWER EXTREMITY ROM:     Active  Right Eval Left Eval  Hip flexion    Hip extension    Hip abduction    Hip adduction    Hip internal rotation    Hip external rotation    Knee flexion    Knee extension    Ankle dorsiflexion 3 -4  Ankle plantarflexion    Ankle inversion    Ankle eversion     (Blank rows = not tested)  LOWER EXTREMITY MMT:    MMT (in sitting) Right Eval Left Eval  Hip flexion 4+ 4  Hip extension    Hip abduction 4 4  Hip adduction 4 4  Hip internal rotation    Hip external rotation    Knee flexion 4+ 4+  Knee extension 4+ 4  Ankle dorsiflexion 4 4  Ankle plantarflexion 4- 4  Ankle inversion    Ankle eversion    (Blank rows = not tested)  GAIT: Gait pattern:  smaller step length evident when getting up from a seat, improved continuity as he gets going Assistive device utilized: Single point cane Level of assistance: Complete Independence   FUNCTIONAL TESTs:        PATIENT EDUCATION: Education details: prognosis, POC, HEP Person educated: Patient Education method: Explanation, Demonstration, Tactile cues, Verbal cues, and Handouts Education comprehension: verbalized understanding and returned demonstration   HOME EXERCISE PROGRAM: Access Code: [email or web address] URL: [email or web address] Date: 09/22/2021 Prepared by: [facility]  Exercises - Sidelying Thoracic Rotation with Open Book  - 1 x daily - 5 x weekly - 2 sets - 10 reps - Forward Backward Weight Shift with Counter Support  - 1 x daily  - 5 x weekly - 2 sets - 10 reps - Standing Gastroc Stretch at Counter  - 1 x daily - 5 x weekly - 2-3 sets - 30 sec hold - Heel Toe Raises with Counter Support  - 1 x daily - 5 x weekly - 2 sets - 10 reps - Seated Isometric Hip Abduction with Resistance (No resistance, just seated step outs)  - 1 x daily - 5 x weekly - 2 sets - 10 reps  GOALS: Goals reviewed with patient? Yes  SHORT TERM GOALS: Target date: 10/13/2021  Patient to be independent with initial HEP. Baseline: HEP initiated Goal status: MET 10/01/21    LONG TERM GOALS: Target date: 11/03/2021  Patient to be independent with advanced HEP. Baseline: Not yet initiated  Goal status: IN PROGRESS  Patient to verbalize understanding of local Parkinson's Disease community resources including community fitness post D/C.   Baseline: Not yet initiated  Goal status: IN PROGRESS  Patient to improve MiniBestTest score to atleast 17-21 to decrease risk of falls.  Baseline: 17/28 09/29/2021 Goal status: IN PROGRESS  Patient to demonstrate <10% increase between TUG and TUG cognitive/manual to improve ability to dual task in home/community.  Baseline: 13.7% TUG cog Goal status: IN PROGRESS  Patient to report 60% improved ease in car transfers and supine>sit out of bed.  Baseline: Not yet initiated Goal status: IN PROGRESS  Patient to verbalize  tips to reduce freezing/festination with gait and turns. Baseline: Not yet initiated  Goal status: IN PROGRESS      ASSESSMENT:  CLINICAL IMPRESSION: Pt presents to OPPT today with reports of feeling better balanced when walking faster.  Discussed/educated patient on increased effort level, increased amplitude of movement with walking, standing exercises, and aerobic activity, and how this can translate to more efficient overall movement patterns.  Pt responds well to cues for this throughout session and reports (and PT notes) improved stability with gait with increased step length, arm  swing, and posture.  He will continue to benefit from skilled PT towards goals for improved overall functional mobility and decreased fall risk.  Did assess HEP today and pt has met STG 1.  OBJECTIVE IMPAIRMENTS Abnormal gait, decreased balance, decreased coordination, decreased endurance, decreased ROM, decreased strength, increased muscle spasms, impaired flexibility, and postural dysfunction.   ACTIVITY LIMITATIONS carrying, lifting, bending, sitting, standing, squatting, stairs, transfers, bed mobility, dressing, reach over head, and hygiene/grooming  PARTICIPATION LIMITATIONS: meal prep, cleaning, laundry, shopping, community activity, yard work, and church  PERSONAL FACTORS Age, Past/current experiences, Time since onset of injury/illness/exacerbation, and 3+ comorbidities: CAD, DM, HLD, HTN, prostate CA, CABG 2004  are also affecting patient's functional outcome.   REHAB POTENTIAL: Good  CLINICAL DECISION MAKING: Evolving/moderate complexity  EVALUATION COMPLEXITY: Moderate  PLAN: PT FREQUENCY:  2x/week for 3 weeks, 1x/week for 3 weeks  PT DURATION: other: see above  PLANNED INTERVENTIONS: Therapeutic exercises, Therapeutic activity, Neuromuscular re-education, Balance training, Gait training, Patient/Family education, Self Care, Joint mobilization, Stair training, Vestibular training, Canalith repositioning, Aquatic Therapy, Dry Needling, Electrical stimulation, Cryotherapy, Moist heat, Taping, Manual therapy, and Re-evaluation  PLAN FOR NEXT SESSION: Review updates to HEP given this visit; continue to Work on balance-hip/ankle/step strategy work, compliant surfaces, dynamic balance; reinforce large amplitude movement patterns with increased effort/intensity with gait and balance.   Mady Haagensen, PT 10/13/21 10:26 AM Phone: [phone] Fax: [phone]   [facility] at [facility] [address] [place], [place] [address] Phone # [phone] Fax # [phone]

## 2021-10-17 ENCOUNTER — Ambulatory Visit: Payer: Medicare Other | Admitting: Physical Therapy

## 2021-10-17 ENCOUNTER — Encounter: Payer: Self-pay | Admitting: Physical Therapy

## 2021-10-17 DIAGNOSIS — R29818 Other symptoms and signs involving the nervous system: Secondary | ICD-10-CM | POA: Diagnosis not present

## 2021-10-17 DIAGNOSIS — R2681 Unsteadiness on feet: Secondary | ICD-10-CM | POA: Diagnosis not present

## 2021-10-17 DIAGNOSIS — R2689 Other abnormalities of gait and mobility: Secondary | ICD-10-CM

## 2021-10-17 DIAGNOSIS — M6281 Muscle weakness (generalized): Secondary | ICD-10-CM

## 2021-10-17 NOTE — Therapy (Signed)
OUTPATIENT PHYSICAL THERAPY NEURO TREATMENT NOTE   Patient Name: Johnny Fox MRN: [id number] DOB:13-Jan-1928, 88 y.o., male Today's Date: 10/17/2021   PCP: Josetta Huddle, MD REFERRING PROVIDER: Tat, Eustace Quail, DO   PT End of Session - 10/17/21 0932     Visit Number 7    Number of Visits 10    Date for PT Re-Evaluation 11/03/21    Authorization Type Medicare/BCBS   KX   PT Start Time 0932    PT Stop Time 1011    PT Time Calculation (min) 39 min    Equipment Utilized During Treatment Gait belt    Activity Tolerance Patient tolerated treatment well    Behavior During Therapy WFL for tasks assessed/performed                   Past Medical History:  Diagnosis Date   Actinic keratoses    and seborrheic keratoses   Arrhythmia    afib postoperatively after CABG in 2004, No recurrence   Coronary artery disease    Diabetes mellitus without complication ([place])    diet controlled   Dyslipidemia    ED (erectile dysfunction)    Elevated PSA    2010, workup with Dr. Risa Grill   H/O prostate biopsy    Hypertension    Onychomycosis    Prostate cancer ([place])    PSA's run approx 7- 2011-2013   Right shoulder injury    july 2014, Dr. Lennette Bihari Supple   Tick bite of abdomen    2013, treated with 3wks of doxycycline, tick engorged 3.35mm nodule left thyroid 4/14- seen on carotid u/s   Past Surgical History:  Procedure Laterality Date   [place] BILATERAL     CORONARY ARTERY BYPASS GRAFT  2004   Patient Active Problem List   Diagnosis Date Noted   Aortic aneurysm ([place]) 03/26/2021   Abnormal gait 03/13/2020   Bronchitis 03/13/2020   Chin laceration 03/13/2020   Cough 03/13/2020   Cramp and spasm 03/13/2020   Disorder of penis 03/13/2020   Dizzy spells 03/13/2020   Dyspnea 03/13/2020   Edema of lower extremity 03/13/2020   Elevated PSA 03/13/2020   Encounter for general adult medical examination without abnormal findings 03/13/2020    Hearing loss in left ear 03/13/2020   History of malignant neoplasm of prostate 03/13/2020   Impacted cerumen 03/13/2020   Low back pain 03/13/2020   Malignant tumor of prostate ([place]) 03/13/2020   Neuropathy 03/13/2020   Orthostatic hypotension 03/13/2020   Other long term (current) drug therapy 03/13/2020   Other specified abnormal findings of blood chemistry 03/13/2020   Other sprain of right hip, initial encounter 03/13/2020   Paresthesia 03/13/2020   Parkinson's disease 03/13/2020   Paroxysmal atrial fibrillation ([place]) 03/13/2020   Polyneuropathy due to type 2 diabetes mellitus ([place]) 03/13/2020   Presence of aortocoronary bypass graft 03/13/2020   Sciatica, right side 03/13/2020   Shoulder joint pain 03/13/2020   Skin sensation disturbance 03/13/2020   Type 2 diabetes mellitus without complications ([place]) 96/75/9163   Upper respiratory infection 03/13/2020   Vitamin D deficiency 03/13/2020   Asymmetrical left sensorineural hearing loss 01/13/2017   Right leg numbness 01/14/2015   Thyroid nodule 01/14/2015   Coronary atherosclerosis of native coronary artery 02/15/2013   Essential hypertension, benign 02/15/2013   Pure hypercholesterolemia 02/15/2013   Fatigue 02/15/2013    ONSET DATE: couple months  REFERRING DIAG: Parkinson's disease  THERAPY DIAG:  Unsteadiness on feet  Other abnormalities of gait and mobility  Muscle weakness (generalized)  Rationale for Evaluation and Treatment Rehabilitation  SUBJECTIVE:                                                                                                                                                                                              SUBJECTIVE STATEMENT: Been working on taking longer steps; seems like its working pretty good. Pt accompanied by: self  PERTINENT HISTORY: CAD, DM, HLD, HTN, prostate CA, CABG 2004  PAIN:  Are you having pain? No  PRECAUTIONS: Fall  PATIENT GOALS make it easier to  get in/out of bed and the car  OBJECTIVE:     TODAY'S TREATMENT: 10/17/2021 Activity Comments  Nustep L4x 6 min  Cues to Maintain 80s SPM   Vitals after ex:  97% O2, 102 HR   Step over obstacles, side direction, x 10 reps, forward direction x 10 reps Review of HEP, pt return demo understanding, but pt reports not yet doing at home  Gait training 200 ft, 250 ft with cane, supervision Good form, good stride length, initial cues for cane sequence  Stair negotiation 2 reps with 1 handrail and cane, step-to pattern with supervision   Forward/back walking along parallel bars, 5 reps   Standing on [place]:  heel/toe raises x 10 reps for ankle strategy work, then marching in place 2 x 10 reps; head turns, head nods 2 x 5 reps, forward alternating step taps x 5 reps BUE support; discussed this as simulating unlevel surfaces he might encounter at the beach  Sit<>stand throughout session at least 8 reps, min UE support, mod independent    Access Code: [email or web address] URL: [email or web address] Date: 10/13/2021-most recent addition Prepared by: [facility]  Exercises - Sidelying Thoracic Rotation with Open Book  - 1 x daily - 5 x weekly - 2 sets - 10 reps - Forward Backward Weight Shift with Counter Support  - 1 x daily - 5 x weekly - 2 sets - 10 reps - Standing Gastroc Stretch at Counter  - 1 x daily - 5 x weekly - 2-3 sets - 30 sec hold - Heel Toe Raises with Counter Support  - 1 x daily - 5 x weekly - 2 sets - 10 reps - Seated Isometric Hip Abduction with Resistance  - 1 x daily - 5 x weekly - 2 sets - 10 reps - Forward Step Over with Counter Support  - 1 x daily - 5 x weekly - 1-2 sets - 10 reps - Side Stepping with Counter Support  - 1  x daily - 5 x weekly - 1-2 sets - 10 reps  PATIENT EDUCATION: Education details: Discussed effort level, increased attention to bigger, more deliberate effort, rating effort level; additions to HEP Person educated:  Patient and Spouse Education method: Explanation, Demonstration, and Handouts Education comprehension: verbalized understanding, returned demonstration, and needs further education     --------------------------------------------------------------------------------------------------------- Objective measures from eval:   DIAGNOSTIC FINDINGS: none recent  COGNITION: Overall cognitive status: Within functional limits for tasks assessed   SENSATION: Reports neuropathy in B feet but denies N/T  COORDINATION: Alt pronation/supination with mild R dysmetria, alt toe tap intact, finger to nose with mild-mod dysmetria R UE  MUSCLE TONE: moderate B LE rigidity in knees and ankles   POSTURE: increased thoracic kyphosis  LOWER EXTREMITY ROM:     Active  Right Eval Left Eval  Hip flexion    Hip extension    Hip abduction    Hip adduction    Hip internal rotation    Hip external rotation    Knee flexion    Knee extension    Ankle dorsiflexion 3 -4  Ankle plantarflexion    Ankle inversion    Ankle eversion     (Blank rows = not tested)  LOWER EXTREMITY MMT:    MMT (in sitting) Right Eval Left Eval  Hip flexion 4+ 4  Hip extension    Hip abduction 4 4  Hip adduction 4 4  Hip internal rotation    Hip external rotation    Knee flexion 4+ 4+  Knee extension 4+ 4  Ankle dorsiflexion 4 4  Ankle plantarflexion 4- 4  Ankle inversion    Ankle eversion    (Blank rows = not tested)  GAIT: Gait pattern:  smaller step length evident when getting up from a seat, improved continuity as he gets going Assistive device utilized: Single point cane Level of assistance: Complete Independence   FUNCTIONAL TESTs:        PATIENT EDUCATION: Education details: prognosis, POC, HEP Person educated: Patient Education method: Explanation, Demonstration, Tactile cues, Verbal cues, and Handouts Education comprehension: verbalized understanding and returned demonstration   HOME  EXERCISE PROGRAM: Access Code: [email or web address] URL: [email or web address] Date: 09/22/2021 Prepared by: [facility]  Exercises - Sidelying Thoracic Rotation with Open Book  - 1 x daily - 5 x weekly - 2 sets - 10 reps - Forward Backward Weight Shift with Counter Support  - 1 x daily - 5 x weekly - 2 sets - 10 reps - Standing Gastroc Stretch at Counter  - 1 x daily - 5 x weekly - 2-3 sets - 30 sec hold - Heel Toe Raises with Counter Support  - 1 x daily - 5 x weekly - 2 sets - 10 reps - Seated Isometric Hip Abduction with Resistance (No resistance, just seated step outs)  - 1 x daily - 5 x weekly - 2 sets - 10 reps  GOALS: Goals reviewed with patient? Yes  SHORT TERM GOALS: Target date: 10/13/2021  Patient to be independent with initial HEP. Baseline: HEP initiated Goal status: MET 10/01/21    LONG TERM GOALS: Target date: 11/03/2021  Patient to be independent with advanced HEP. Baseline: Not yet initiated  Goal status: IN PROGRESS  Patient to verbalize understanding of local Parkinson's Disease community resources including community fitness post D/C.   Baseline: Not yet initiated  Goal status: IN PROGRESS  Patient to improve MiniBestTest score to atleast 17-21 to decrease  risk of falls.  Baseline: 17/28 09/29/2021 Goal status: IN PROGRESS  Patient to demonstrate <10% increase between TUG and TUG cognitive/manual to improve ability to dual task in home/community.  Baseline: 13.7% TUG cog Goal status: IN PROGRESS  Patient to report 60% improved ease in car transfers and supine>sit out of bed.  Baseline: Not yet initiated Goal status: IN PROGRESS  Patient to verbalize tips to reduce freezing/festination with gait and turns. Baseline: Not yet initiated  Goal status: IN PROGRESS      ASSESSMENT:  CLINICAL IMPRESSION: Skilled PT session today continued to focus on increased intensity with exercise and with gait activities.   Pt reports increased attention to increased step length since last session.  Pt continues to report fatigue and requests several brief rest breaks during session.  Worked on balance and unlevel surfaces in preparation for pt's upcoming beach trip.  He will continue to benefit from skilled PT towards goals for improved overall functional mobility and decreased fall risk.  OBJECTIVE IMPAIRMENTS Abnormal gait, decreased balance, decreased coordination, decreased endurance, decreased ROM, decreased strength, increased muscle spasms, impaired flexibility, and postural dysfunction.   ACTIVITY LIMITATIONS carrying, lifting, bending, sitting, standing, squatting, stairs, transfers, bed mobility, dressing, reach over head, and hygiene/grooming  PARTICIPATION LIMITATIONS: meal prep, cleaning, laundry, shopping, community activity, yard work, and church  PERSONAL FACTORS Age, Past/current experiences, Time since onset of injury/illness/exacerbation, and 3+ comorbidities: CAD, DM, HLD, HTN, prostate CA, CABG 2004  are also affecting patient's functional outcome.   REHAB POTENTIAL: Good  CLINICAL DECISION MAKING: Evolving/moderate complexity  EVALUATION COMPLEXITY: Moderate  PLAN: PT FREQUENCY:  2x/week for 3 weeks, 1x/week for 3 weeks  PT DURATION: other: see above  PLANNED INTERVENTIONS: Therapeutic exercises, Therapeutic activity, Neuromuscular re-education, Balance training, Gait training, Patient/Family education, Self Care, Joint mobilization, Stair training, Vestibular training, Canalith repositioning, Aquatic Therapy, Dry Needling, Electrical stimulation, Cryotherapy, Moist heat, Taping, Manual therapy, and Re-evaluation  PLAN FOR NEXT SESSION: Continue to Work on balance-hip/ankle/step strategy work, compliant surfaces, dynamic balance; reinforce large amplitude movement patterns with increased effort/intensity with gait and balance.   Mady Haagensen, PT 10/17/21 10:11 AM Phone:  [phone] Fax: [phone]   [facility] at [facility] [place], [address] [place], [place] [address] Phone # [phone] Fax # [phone]

## 2021-10-17 NOTE — Therapy (Signed)
OUTPATIENT PHYSICAL THERAPY NEURO TREATMENT NOTE   Patient Name: Johnny Fox MRN: [id number] DOB:04-17-1928, 88 y.o., male Today's Date: 10/20/2021   PCP: Josetta Huddle, MD REFERRING PROVIDER: Tat, Eustace Quail, DO   PT End of Session - 10/20/21 1011     Visit Number 8    Number of Visits 10    Date for PT Re-Evaluation 11/03/21    Authorization Type Medicare/BCBS   KX   PT Start Time 0930    PT Stop Time 1012    PT Time Calculation (min) 42 min    Equipment Utilized During Treatment Gait belt    Activity Tolerance Patient tolerated treatment well;Patient limited by fatigue    Behavior During Therapy WFL for tasks assessed/performed                    Past Medical History:  Diagnosis Date   Actinic keratoses    and seborrheic keratoses   Arrhythmia    afib postoperatively after CABG in 2004, No recurrence   Coronary artery disease    Diabetes mellitus without complication ([place])    diet controlled   Dyslipidemia    ED (erectile dysfunction)    Elevated PSA    2010, workup with Dr. Risa Grill   H/O prostate biopsy    Hypertension    Onychomycosis    Prostate cancer ([place])    PSA's run approx 7- 2011-2013   Right shoulder injury    july 2014, Dr. Lennette Bihari Supple   Tick bite of abdomen    2013, treated with 3wks of doxycycline, tick engorged 3.52mm nodule left thyroid 4/14- seen on carotid u/s   Past Surgical History:  Procedure Laterality Date   [place], BILATERAL     CORONARY ARTERY BYPASS GRAFT  2004   Patient Active Problem List   Diagnosis Date Noted   Aortic aneurysm ([place]) 03/26/2021   Abnormal gait 03/13/2020   Bronchitis 03/13/2020   Chin laceration 03/13/2020   Cough 03/13/2020   Cramp and spasm 03/13/2020   Disorder of penis 03/13/2020   Dizzy spells 03/13/2020   Dyspnea 03/13/2020   Edema of lower extremity 03/13/2020   Elevated PSA 03/13/2020   Encounter for general adult medical examination without  abnormal findings 03/13/2020   Hearing loss in left ear 03/13/2020   History of malignant neoplasm of prostate 03/13/2020   Impacted cerumen 03/13/2020   Low back pain 03/13/2020   Malignant tumor of prostate ([place]) 03/13/2020   Neuropathy 03/13/2020   Orthostatic hypotension 03/13/2020   Other long term (current) drug therapy 03/13/2020   Other specified abnormal findings of blood chemistry 03/13/2020   Other sprain of right hip, initial encounter 03/13/2020   Paresthesia 03/13/2020   Parkinson's disease 03/13/2020   Paroxysmal atrial fibrillation ([place]) 03/13/2020   Polyneuropathy due to type 2 diabetes mellitus ([place]) 03/13/2020   Presence of aortocoronary bypass graft 03/13/2020   Sciatica, right side 03/13/2020   Shoulder joint pain 03/13/2020   Skin sensation disturbance 03/13/2020   Type 2 diabetes mellitus without complications ([place]) 27/25/3664   Upper respiratory infection 03/13/2020   Vitamin D deficiency 03/13/2020   Asymmetrical left sensorineural hearing loss 01/13/2017   Right leg numbness 01/14/2015   Thyroid nodule 01/14/2015   Coronary atherosclerosis of native coronary artery 02/15/2013   Essential hypertension, benign 02/15/2013   Pure hypercholesterolemia 02/15/2013   Fatigue 02/15/2013    ONSET DATE: couple months  REFERRING DIAG: Parkinson's disease  THERAPY DIAG:  Unsteadiness on feet  Other abnormalities of gait and mobility  Muscle weakness (generalized)  Other symptoms and signs involving the nervous system  Rationale for Evaluation and Treatment Rehabilitation  SUBJECTIVE:                                                                                                                                                                                              SUBJECTIVE STATEMENT: Having some back pain today. It was really bad yesterday but I took Tylenol and used heat and that helped a lot. Thinks that he is having a reaction to his flu shot.   Pt accompanied by: self  PERTINENT HISTORY: CAD, DM, HLD, HTN, prostate CA, CABG 2004  PAIN:  Are you having pain? Yes: NPRS scale: 4-5/10 Pain location: LB Pain description: sharp Aggravating factors: getting up out of chair Relieving factors: tylenol, heat  PRECAUTIONS: Fall  PATIENT GOALS make it easier to get in/out of bed and the car  OBJECTIVE:     TODAY'S TREATMENT: 10/20/21 Activity Comments  Nustep L4 x 6 min Ues/Les  Maintaining ~80 SPM  Sitting prayer stretch with green pball 10x3-5" Good tolerance   Sitting trunk twist 10x each holding onto armrest for extra stretch  grape vine in II bars Holding on with B hands; consistent verbal cueing and demo; best response to demo  forward step and back over 1/2 foam roll 2x10 each LE 1 UE support; more difficulty on R LE  backwards walking 2x56ft Cues for widen BOS and longer steps; c/o latent dizziness  Backwards/forwards walking + ball toss 4x40 ft Choppy discontinuous steps, improved with practice   side step over and back over hurdle 2x5 each LE With and without 1 UE support on II bar; cues for higher amplitude step      Access Code: [email or web address] URL: [email or web address] Date: 10/13/2021-most recent addition Prepared by: [facility]  Exercises - Sidelying Thoracic Rotation with Open Book  - 1 x daily - 5 x weekly - 2 sets - 10 reps - Forward Backward Weight Shift with Counter Support  - 1 x daily - 5 x weekly - 2 sets - 10 reps - Standing Gastroc Stretch at Counter  - 1 x daily - 5 x weekly - 2-3 sets - 30 sec hold - Heel Toe Raises with Counter Support  - 1 x daily - 5 x weekly - 2 sets - 10 reps - Seated Isometric Hip Abduction with Resistance  - 1 x daily - 5 x weekly - 2 sets - 10 reps - Forward Step Over with  Counter Support  - 1 x daily - 5 x weekly - 1-2 sets - 10 reps - Side Stepping with Counter Support  - 1 x daily - 5 x weekly - 1-2 sets - 10  reps    --------------------------------------------------------------------------------------------------------- Objective measures from eval:   DIAGNOSTIC FINDINGS: none recent  COGNITION: Overall cognitive status: Within functional limits for tasks assessed   SENSATION: Reports neuropathy in B feet but denies N/T  COORDINATION: Alt pronation/supination with mild R dysmetria, alt toe tap intact, finger to nose with mild-mod dysmetria R UE  MUSCLE TONE: moderate B LE rigidity in knees and ankles   POSTURE: increased thoracic kyphosis  LOWER EXTREMITY ROM:     Active  Right Eval Left Eval  Hip flexion    Hip extension    Hip abduction    Hip adduction    Hip internal rotation    Hip external rotation    Knee flexion    Knee extension    Ankle dorsiflexion 3 -4  Ankle plantarflexion    Ankle inversion    Ankle eversion     (Blank rows = not tested)  LOWER EXTREMITY MMT:    MMT (in sitting) Right Eval Left Eval  Hip flexion 4+ 4  Hip extension    Hip abduction 4 4  Hip adduction 4 4  Hip internal rotation    Hip external rotation    Knee flexion 4+ 4+  Knee extension 4+ 4  Ankle dorsiflexion 4 4  Ankle plantarflexion 4- 4  Ankle inversion    Ankle eversion    (Blank rows = not tested)  GAIT: Gait pattern:  smaller step length evident when getting up from a seat, improved continuity as he gets going Assistive device utilized: Single point cane Level of assistance: Complete Independence   FUNCTIONAL TESTs:        PATIENT EDUCATION: Education details: prognosis, POC, HEP Person educated: Patient Education method: Explanation, Demonstration, Tactile cues, Verbal cues, and Handouts Education comprehension: verbalized understanding and returned demonstration   HOME EXERCISE PROGRAM: Access Code: [email or web address] URL: [email or web address] Date: 09/22/2021 Prepared by: [facility]  Exercises -  Sidelying Thoracic Rotation with Open Book  - 1 x daily - 5 x weekly - 2 sets - 10 reps - Forward Backward Weight Shift with Counter Support  - 1 x daily - 5 x weekly - 2 sets - 10 reps - Standing Gastroc Stretch at Counter  - 1 x daily - 5 x weekly - 2-3 sets - 30 sec hold - Heel Toe Raises with Counter Support  - 1 x daily - 5 x weekly - 2 sets - 10 reps - Seated Isometric Hip Abduction with Resistance (No resistance, just seated step outs)  - 1 x daily - 5 x weekly - 2 sets - 10 reps  GOALS: Goals reviewed with patient? Yes  SHORT TERM GOALS: Target date: 10/13/2021  Patient to be independent with initial HEP. Baseline: HEP initiated Goal status: MET 10/01/21    LONG TERM GOALS: Target date: 11/03/2021  Patient to be independent with advanced HEP. Baseline: Not yet initiated  Goal status: IN PROGRESS  Patient to verbalize understanding of local Parkinson's Disease community resources including community fitness post D/C.   Baseline: Not yet initiated  Goal status: IN PROGRESS  Patient to improve MiniBestTest score to atleast 17-21 to decrease risk of falls.  Baseline: 17/28 09/29/2021 Goal status: IN PROGRESS  Patient to demonstrate <10% increase  between TUG and TUG cognitive/manual to improve ability to dual task in home/community.  Baseline: 13.7% TUG cog Goal status: IN PROGRESS  Patient to report 60% improved ease in car transfers and supine>sit out of bed.  Baseline: Not yet initiated Goal status: IN PROGRESS  Patient to verbalize tips to reduce freezing/festination with gait and turns. Baseline: Not yet initiated  Goal status: IN PROGRESS      ASSESSMENT:  CLINICAL IMPRESSION: Patient arrived to session with report of new onset of LBP starting yesterday and seems to be improving. Worked on gentle lumbopelvic mobility exercises to tolerance. Proceeded with standing dynamic balance activities with multidirectional stepping/walking and dual tasking to challenge  coordination. Patient responded well to demonstration with coordination activities. More difficulty on R LE evident with stepping tasks. Patient required short intermittent sitting rest breaks d/t fatigue during session. Patient reported decreased LBP at end of session compared to start of session.   OBJECTIVE IMPAIRMENTS Abnormal gait, decreased balance, decreased coordination, decreased endurance, decreased ROM, decreased strength, increased muscle spasms, impaired flexibility, and postural dysfunction.   ACTIVITY LIMITATIONS carrying, lifting, bending, sitting, standing, squatting, stairs, transfers, bed mobility, dressing, reach over head, and hygiene/grooming  PARTICIPATION LIMITATIONS: meal prep, cleaning, laundry, shopping, community activity, yard work, and church  PERSONAL FACTORS Age, Past/current experiences, Time since onset of injury/illness/exacerbation, and 3+ comorbidities: CAD, DM, HLD, HTN, prostate CA, CABG 2004  are also affecting patient's functional outcome.   REHAB POTENTIAL: Good  CLINICAL DECISION MAKING: Evolving/moderate complexity  EVALUATION COMPLEXITY: Moderate  PLAN: PT FREQUENCY:  2x/week for 3 weeks, 1x/week for 3 weeks  PT DURATION: other: see above  PLANNED INTERVENTIONS: Therapeutic exercises, Therapeutic activity, Neuromuscular re-education, Balance training, Gait training, Patient/Family education, Self Care, Joint mobilization, Stair training, Vestibular training, Canalith repositioning, Aquatic Therapy, Dry Needling, Electrical stimulation, Cryotherapy, Moist heat, Taping, Manual therapy, and Re-evaluation  PLAN FOR NEXT SESSION: Continue to Work on balance-hip/ankle/step strategy work, compliant surfaces, dynamic balance; reinforce large amplitude movement patterns with increased effort/intensity with gait and balance.    Janene Harvey, PT, DPT 10/20/21 10:13 AM  [facility] at [facility] [place],  [address] [place], [place] [address] Phone # [phone] Fax # [phone]

## 2021-10-18 DIAGNOSIS — Z23 Encounter for immunization: Secondary | ICD-10-CM | POA: Diagnosis not present

## 2021-10-20 ENCOUNTER — Encounter: Payer: Self-pay | Admitting: Physical Therapy

## 2021-10-20 ENCOUNTER — Ambulatory Visit: Payer: Medicare Other | Admitting: Physical Therapy

## 2021-10-20 DIAGNOSIS — R29818 Other symptoms and signs involving the nervous system: Secondary | ICD-10-CM

## 2021-10-20 DIAGNOSIS — R2681 Unsteadiness on feet: Secondary | ICD-10-CM | POA: Diagnosis not present

## 2021-10-20 DIAGNOSIS — R2689 Other abnormalities of gait and mobility: Secondary | ICD-10-CM

## 2021-10-20 DIAGNOSIS — M6281 Muscle weakness (generalized): Secondary | ICD-10-CM

## 2021-11-04 ENCOUNTER — Ambulatory Visit: Payer: Medicare Other | Admitting: Physical Therapy

## 2021-11-04 ENCOUNTER — Encounter: Payer: Self-pay | Admitting: Physical Therapy

## 2021-11-04 DIAGNOSIS — R2681 Unsteadiness on feet: Secondary | ICD-10-CM

## 2021-11-04 DIAGNOSIS — R2689 Other abnormalities of gait and mobility: Secondary | ICD-10-CM | POA: Diagnosis not present

## 2021-11-04 DIAGNOSIS — R29818 Other symptoms and signs involving the nervous system: Secondary | ICD-10-CM | POA: Diagnosis not present

## 2021-11-04 DIAGNOSIS — M6281 Muscle weakness (generalized): Secondary | ICD-10-CM | POA: Diagnosis not present

## 2021-11-04 NOTE — Therapy (Signed)
OUTPATIENT PHYSICAL THERAPY NEURO TREATMENT NOTE/DISCHARGE SUMMARY   Patient Name: Johnny Fox MRN: [id number] DOB:October 13, 1928, 88 y.o., male Today's Date: 11/04/2021  PHYSICAL THERAPY DISCHARGE SUMMARY  Visits from Start of Care: 9  Current functional level related to goals / functional outcomes: See below-pt has met 4 of 6 LTGs   Remaining deficits: See below-bradykinesia, balance, decreased endurance   Education / Equipment: Educated in [employer], community fitness options   Patient agrees to discharge. Patient goals were partially met. Patient is being discharged due to being pleased with the current functional level.   PCP: Josetta Huddle, MD REFERRING PROVIDER: Ludwig Clarks, DO   PT End of Session - 11/04/21 0925     Visit Number 9    Number of Visits 10    Date for PT Re-Evaluation 11/03/21    Authorization Type Medicare/BCBS   KX   PT Start Time 0931    PT Stop Time 1016    PT Time Calculation (min) 45 min    Equipment Utilized During Treatment Gait belt    Activity Tolerance Patient tolerated treatment well;Patient limited by fatigue    Behavior During Therapy [facility] for tasks assessed/performed                     Past Medical History:  Diagnosis Date   Actinic keratoses    and seborrheic keratoses   Arrhythmia    afib postoperatively after CABG in 2004, No recurrence   Coronary artery disease    Diabetes mellitus without complication ([place])    diet controlled   Dyslipidemia    ED (erectile dysfunction)    Elevated PSA    2010, workup with Dr. Risa Grill   H/O prostate biopsy    Hypertension    Onychomycosis    Prostate cancer ([place])    PSA's run approx 7- 2011-2013   Right shoulder injury    july 2014, Dr. Lennette Bihari Supple   Tick bite of abdomen    2013, treated with 3wks of doxycycline, tick engorged 3.89m nodule left thyroid 4/14- seen on carotid u/s   Past Surgical History:  Procedure Laterality Date   [place] BILATERAL     CORONARY ARTERY BYPASS GRAFT  2004   Patient Active Problem List   Diagnosis Date Noted   Aortic aneurysm ([place] 03/26/2021   Abnormal gait 03/13/2020   Bronchitis 03/13/2020   Chin laceration 03/13/2020   Cough 03/13/2020   Cramp and spasm 03/13/2020   Disorder of penis 03/13/2020   Dizzy spells 03/13/2020   Dyspnea 03/13/2020   Edema of lower extremity 03/13/2020   Elevated PSA 03/13/2020   Encounter for general adult medical examination without abnormal findings 03/13/2020   Hearing loss in left ear 03/13/2020   History of malignant neoplasm of prostate 03/13/2020   Impacted cerumen 03/13/2020   Low back pain 03/13/2020   Malignant tumor of prostate ([place] 03/13/2020   Neuropathy 03/13/2020   Orthostatic hypotension 03/13/2020   Other long term (current) drug therapy 03/13/2020   Other specified abnormal findings of blood chemistry 03/13/2020   Other sprain of right hip, initial encounter 03/13/2020   Paresthesia 03/13/2020   Parkinson's disease 03/13/2020   Paroxysmal atrial fibrillation ([facility] 03/13/2020   Polyneuropathy due to type 2 diabetes mellitus ([place] 03/13/2020   Presence of aortocoronary bypass graft 03/13/2020   Sciatica, right side 03/13/2020   Shoulder joint pain 03/13/2020   Skin sensation disturbance 03/13/2020   Type 2  diabetes mellitus without complications ([place]) 28/41/3244   Upper respiratory infection 03/13/2020   Vitamin D deficiency 03/13/2020   Asymmetrical left sensorineural hearing loss 01/13/2017   Right leg numbness 01/14/2015   Thyroid nodule 01/14/2015   Coronary atherosclerosis of native coronary artery 02/15/2013   Essential hypertension, benign 02/15/2013   Pure hypercholesterolemia 02/15/2013   Fatigue 02/15/2013    ONSET DATE: couple months  REFERRING DIAG: Parkinson's disease  THERAPY DIAG:  Unsteadiness on feet  Other abnormalities of gait and mobility  Muscle weakness (generalized)  Rationale for  Evaluation and Treatment Rehabilitation  SUBJECTIVE:                                                                                                                                                                                              SUBJECTIVE STATEMENT: Went to the beach, and it was a bit rough due to being in a different place.  Feel like I've made some progress with therapy. Pt accompanied by: self  PERTINENT HISTORY: CAD, DM, HLD, HTN, prostate CA, CABG 2004  PAIN:  Are you having pain? Yes: NPRS scale: 3-4/10 Pain location: LB Pain description: sharp Aggravating factors: getting up out of chair Relieving factors: tylenol, heat  PRECAUTIONS: Fall  PATIENT GOALS make it easier to get in/out of bed and the car  OBJECTIVE:    TODAY'S TREATMENT: 11/04/2021 Activity Comments  NuStep, Level 4, 4 extremities x 6 minutes  Cues to keep SPM 75-80  5x sit<>stand:  17.03 sec, arms crossed at chest   TUG:  13.44 sec;  TUG cognitive:  15.81 sec 2.37 sec difference  MiniBESTest:  18/28 (improved from 17/28) Scores<22/28 indicates increased fall risk  Reviewed exercises in HEP (see below) Pt needs min cues for technique, reports slight dizziness after step strategy exercises      BP 114/75, HR 91 bpm PATIENT EDUCATION: Education details: Progress towards goals, POC, importance of HEP, community fitness and Parkinson's resource information Person educated: Patient Education method: Consulting civil engineer, Media planner, and Handouts Education comprehension: verbalized understanding         --------------------------------------------------------------------------------------------------------- Objective measures from eval:   DIAGNOSTIC FINDINGS: none recent  COGNITION: Overall cognitive status: Within functional limits for tasks assessed   SENSATION: Reports neuropathy in B feet but denies N/T  COORDINATION: Alt pronation/supination with mild R dysmetria, alt toe tap  intact, finger to nose with mild-mod dysmetria R UE  MUSCLE TONE: moderate B LE rigidity in knees and ankles   POSTURE: increased thoracic kyphosis  LOWER EXTREMITY ROM:     Active  Right Eval Left Eval  Hip flexion    Hip  extension    Hip abduction    Hip adduction    Hip internal rotation    Hip external rotation    Knee flexion    Knee extension    Ankle dorsiflexion 3 -4  Ankle plantarflexion    Ankle inversion    Ankle eversion     (Blank rows = not tested)  LOWER EXTREMITY MMT:    MMT (in sitting) Right Eval Left Eval  Hip flexion 4+ 4  Hip extension    Hip abduction 4 4  Hip adduction 4 4  Hip internal rotation    Hip external rotation    Knee flexion 4+ 4+  Knee extension 4+ 4  Ankle dorsiflexion 4 4  Ankle plantarflexion 4- 4  Ankle inversion    Ankle eversion    (Blank rows = not tested)  GAIT: Gait pattern:  smaller step length evident when getting up from a seat, improved continuity as he gets going Assistive device utilized: Single point cane Level of assistance: Complete Independence   FUNCTIONAL TESTs:   [facility] PT Assessment - 11/04/21 0001       Mini-BESTest   Sit To Stand Normal: Comes to stand without use of hands and stabilizes independently.    Rise to Toes < 3 s.    Stand on one leg (left) Moderate: < 20 s   2, 1.65   Stand on one leg (right) Moderate: < 20 s   1.09, 1.81   Stand on one leg - lowest score 1    Compensatory Stepping Correction - Forward Normal: Recovers independently with a single, large step (second realignement is allowed).    Compensatory Stepping Correction - Backward Moderate: More than one step is required to recover equilibrium    Compensatory Stepping Correction - Left Lateral Normal: Recovers independently with 1 step (crossover or lateral OK)    Compensatory Stepping Correction - Right Lateral Normal: Recovers independently with 1 step (crossover or lateral OK)    Stepping Corredtion Lateral - lowest score 2     Stance - Feet together, eyes open, firm surface  Normal: 30s    Stance - Feet together, eyes closed, foam surface  Severe: Unable    Incline - Eyes Closed Moderate: Stands independently < 30s OR aligns with surface   11.44 sec with posterior lean   Change in Gait Speed Normal: Significantly changes walkling speed without imbalance    Walk with head turns - Horizontal Moderate: performs head turns with reduction in gait speed.    Walk with pivot turns Normal: Turns with feet close FAST (< 3 steps) with good balance.    Step over obstacles Moderate: Steps over box but touches box OR displays cautious behavior by slowing gait.    Timed UP & GO with Dual Task Moderate: Dual Task affects either counting OR walking (>10%) when compared to the TUG without Dual Task.    Mini-BEST total score 18                 PATIENT EDUCATION: Education details: prognosis, POC, HEP Person educated: Patient Education method: Explanation, Demonstration, Tactile cues, Verbal cues, and Handouts Education comprehension: verbalized understanding and returned demonstration   HOME EXERCISE PROGRAM: Access Code: [email or web address] URL: [email or web address] Date: 09/22/2021 Prepared by: [facility]  Exercises - Sidelying Thoracic Rotation with Open Book  - 1 x daily - 5 x weekly - 2 sets - 10 reps - Forward Backward Weight Shift with Counter  Support  - 1 x daily - 5 x weekly - 2 sets - 10 reps - Standing Gastroc Stretch at Counter  - 1 x daily - 5 x weekly - 2-3 sets - 30 sec hold - Heel Toe Raises with Counter Support  - 1 x daily - 5 x weekly - 2 sets - 10 reps - Seated Isometric Hip Abduction with Resistance (No resistance, just seated step outs)  - 1 x daily - 5 x weekly - 2 sets - 10 reps  GOALS: Goals reviewed with patient? Yes  SHORT TERM GOALS: Target date: 10/13/2021  Patient to be independent with initial HEP. Baseline: HEP initiated Goal status:  MET 10/01/21    LONG TERM GOALS: Target date: 11/03/2021  Patient to be independent with advanced HEP. Baseline: Needs cues; reports participating 1-2x/wk Goal status: GOAL NOT MET  Patient to verbalize understanding of local Parkinson's Disease community resources including community fitness post D/C.   Baseline: Info provided Goal status: GOAL MET  Patient to improve MiniBestTest score to atleast 17-21 to decrease risk of falls.  Baseline: 17/28 09/29/2021>18/28 11/04/2021 Goal status: GOAL MET  Patient to demonstrate <10% increase between TUG and TUG cognitive/manual to improve ability to dual task in home/community.  Baseline: 13.7% TUG cog Goal status: GOAL NOT MET  Patient to report 60% improved ease in car transfers and supine>sit out of bed.  Baseline:Reports improvement in both Goal status: GOAL MET  Patient to verbalize tips to reduce freezing/festination with gait and turns. Baseline:Education provided, 11/04/2021, but pt reports no issues Goal status: GOAL MET      ASSESSMENT:  CLINICAL IMPRESSION: Assessed LTGs this session, with pt meeting 4 of LTGs.  Pt has met LTG 2, 3, 5, and 6.  LTG 3 improved slightly for improved MiniBESTest; LTG 4 not met for TUG score for dual tasking.  Pt does continue to report his balance is an issue; have provided HEP for patient to address balance, with education on HEP progression and importance of HEP targeting balance deficits.  He continues to use cane for stability for distances with gait.  He continues to report fatigue as a limiting factor for exercises and participation in group exercises other than his current [employer] class.  He is appropriate for d/c at this time, with planned return screen in several months.  OBJECTIVE IMPAIRMENTS Abnormal gait, decreased balance, decreased coordination, decreased endurance, decreased ROM, decreased strength, increased muscle spasms, impaired flexibility, and postural dysfunction.    ACTIVITY LIMITATIONS carrying, lifting, bending, sitting, standing, squatting, stairs, transfers, bed mobility, dressing, reach over head, and hygiene/grooming  PARTICIPATION LIMITATIONS: meal prep, cleaning, laundry, shopping, community activity, yard work, and church  PERSONAL FACTORS Age, Past/current experiences, Time since onset of injury/illness/exacerbation, and 3+ comorbidities: CAD, DM, HLD, HTN, prostate CA, CABG 2004  are also affecting patient's functional outcome.   REHAB POTENTIAL: Good  CLINICAL DECISION MAKING: Evolving/moderate complexity  EVALUATION COMPLEXITY: Moderate  PLAN: PT FREQUENCY:  2x/week for 3 weeks, 1x/week for 3 weeks  PT DURATION: other: see above  PLANNED INTERVENTIONS: Therapeutic exercises, Therapeutic activity, Neuromuscular re-education, Balance training, Gait training, Patient/Family education, Self Care, Joint mobilization, Stair training, Vestibular training, Canalith repositioning, Aquatic Therapy, Dry Needling, Electrical stimulation, Cryotherapy, Moist heat, Taping, Manual therapy, and Re-evaluation  PLAN FOR NEXT SESSION: D/C this visit; pt to return for screen in January 2024.   Mady Haagensen, PT 11/04/21 1:42 PM Phone: [phone] Fax: [phone]   [place] [facility] [address], [place],  [address] Phone # [phone] Fax # [phone]

## 2021-11-10 ENCOUNTER — Ambulatory Visit: Payer: Medicare Other | Admitting: Physical Therapy

## 2021-11-20 DIAGNOSIS — E1142 Type 2 diabetes mellitus with diabetic polyneuropathy: Secondary | ICD-10-CM | POA: Diagnosis not present

## 2021-11-20 DIAGNOSIS — N1831 Chronic kidney disease, stage 3a: Secondary | ICD-10-CM | POA: Diagnosis not present

## 2021-11-20 DIAGNOSIS — E119 Type 2 diabetes mellitus without complications: Secondary | ICD-10-CM | POA: Diagnosis not present

## 2021-11-20 DIAGNOSIS — E559 Vitamin D deficiency, unspecified: Secondary | ICD-10-CM | POA: Diagnosis not present

## 2021-11-20 DIAGNOSIS — N138 Other obstructive and reflux uropathy: Secondary | ICD-10-CM | POA: Diagnosis not present

## 2021-11-20 DIAGNOSIS — N400 Enlarged prostate without lower urinary tract symptoms: Secondary | ICD-10-CM | POA: Diagnosis not present

## 2021-11-20 DIAGNOSIS — G20A1 Parkinson's disease without dyskinesia, without mention of fluctuations: Secondary | ICD-10-CM | POA: Diagnosis not present

## 2021-11-20 DIAGNOSIS — N401 Enlarged prostate with lower urinary tract symptoms: Secondary | ICD-10-CM | POA: Diagnosis not present

## 2021-11-20 DIAGNOSIS — I10 Essential (primary) hypertension: Secondary | ICD-10-CM | POA: Diagnosis not present

## 2021-12-15 DIAGNOSIS — R55 Syncope and collapse: Secondary | ICD-10-CM | POA: Diagnosis not present

## 2021-12-15 DIAGNOSIS — E1142 Type 2 diabetes mellitus with diabetic polyneuropathy: Secondary | ICD-10-CM | POA: Diagnosis not present

## 2021-12-15 DIAGNOSIS — N1832 Chronic kidney disease, stage 3b: Secondary | ICD-10-CM | POA: Diagnosis not present

## 2021-12-15 DIAGNOSIS — I959 Hypotension, unspecified: Secondary | ICD-10-CM | POA: Diagnosis not present

## 2021-12-15 DIAGNOSIS — Z9181 History of falling: Secondary | ICD-10-CM | POA: Diagnosis not present

## 2021-12-15 DIAGNOSIS — D539 Nutritional anemia, unspecified: Secondary | ICD-10-CM | POA: Diagnosis not present

## 2021-12-15 DIAGNOSIS — I483 Typical atrial flutter: Secondary | ICD-10-CM | POA: Diagnosis not present

## 2021-12-15 DIAGNOSIS — I480 Paroxysmal atrial fibrillation: Secondary | ICD-10-CM | POA: Diagnosis not present

## 2021-12-15 LAB — LAB REPORT - SCANNED: EGFR: 42

## 2021-12-24 ENCOUNTER — Encounter: Payer: Self-pay | Admitting: Podiatry

## 2021-12-24 ENCOUNTER — Ambulatory Visit (INDEPENDENT_AMBULATORY_CARE_PROVIDER_SITE_OTHER): Payer: Medicare Other | Admitting: Podiatry

## 2021-12-24 DIAGNOSIS — M79676 Pain in unspecified toe(s): Secondary | ICD-10-CM

## 2021-12-24 DIAGNOSIS — E119 Type 2 diabetes mellitus without complications: Secondary | ICD-10-CM

## 2021-12-24 DIAGNOSIS — B351 Tinea unguium: Secondary | ICD-10-CM

## 2021-12-24 NOTE — Progress Notes (Signed)
This patient returns to my office for at risk foot care.  This patient requires this care by a professional since this patient will be at risk due to having  Diabetic neuropathy and Parkinsons.  This patient is unable to cut nails himself since the patient cannot reach his nails.These nails are painful walking and wearing shoes.  This patient presents for at risk foot care today.  General Appearance  Alert, conversant and in no acute stress.  Vascular  Dorsalis pedis and posterior tibial  pulses are palpable  bilaterally.  Capillary return is within normal limits  bilaterally. Temperature is within normal limits  bilaterally.  Neurologic  Senn-Weinstein monofilament wire diminished  bilaterally. Muscle power within normal limits bilaterally.  Nails Thick disfigured discolored nails with subungual debris  from hallux to fifth toes bilaterally. No evidence of bacterial infection or drainage bilaterally.  Orthopedic  No limitations of motion  feet .  No crepitus or effusions noted.  No bony pathology or digital deformities noted.  Skin  normotropic skin with no porokeratosis noted bilaterally.  No signs of infections or ulcers noted.     Onychomycosis  Pain in right toes  Pain in left toes  Consent was obtained for treatment procedures.   Mechanical debridement of nails 1-5  bilaterally performed with a nail nipper.  Filed with dremel without incident.    Return office visit   3 months                   Told patient to return for periodic foot care and evaluation due to potential at risk complications.   Gardiner Barefoot DPM

## 2022-01-06 ENCOUNTER — Encounter: Payer: Self-pay | Admitting: Physician Assistant

## 2022-01-06 NOTE — Progress Notes (Addendum)
Cardiology Office Note    Date:  01/07/2022  ID:  TAVAUGHN SILGUERO, DOB [birth year], MRN [id number]  PCP:  Josetta Huddle, MD  Cardiologist:  Larae Grooms, MD  Electrophysiologist:  None   Chief Complaint: f/u CAD, HTN  History of Present Illness:   BENJY KANA is a 89 y.o. male with history of CAD s/p CABG 2004 with post-op afib without recurrence, dyslipidemia, RBBB/first degree AVB, ED, prostate CA, HTN, Parkinson's disease, diet controlled DM, CKD stage 3a by last labs, mild carotid artery disease by duplex 2014, exercise limited by neuropathic pain/balance who is seen for follow-up. Per Dr. Hassell Done notes, had minimal symptoms prior to CABG, CAD picked up on stress testing with some DOE/minimal angina at that time. Last ischemic eval was by nuc in 01/2021 which small defect in mid anterior location c/w ischemia, low risk study ,EF 68%, managed medically. At last OV 01/2021, amlodipine was added. However, subsequent to this he had issues with low BP in the morning so was given an rx for hydralazine PRN.  Over the last year he's done well except had a mechanical fall earlier in December on a day when he'd had poor oral intake. He got up quickly then stand to turn, tripped, then fell. Wife checked BP and it was in the 80s. Saw PCP and hydralazine was stopped. They think he was told there might be something going on with his heart and he was advised to see cardiology back. [facility] labs listed below with stable Hgb and creatinine. He reports he generally doesn't have any energy but this is baseline. Systolic murmur is noted on exam today. He does not recall being told he had one before. Wife also clarifies he is no longer on Lasix, does not recall when stopped.   Labwork independently reviewed: 12/15/21 [facility] BUN 30, Cr 1.550, Hgb 13.2, K 4.6, TSH wnl 03/2021 [facility] PCP LDL 45, trig 123 01/2021 troponin neg, Hgb 13.1, plt 138, K 4.7, Cr 1.41, glucose 116 2017 Cr 1.67, Hgb 13.8, plt 183/163?  (Scan quality)  Cardiology Studies:   Studies reviewed are outlined and summarized above. Reports may be included below. Otherwise see EMR.  Nuclear stress test 02/2019 The left ventricular ejection fraction is hyperdynamic (>65%). Nuclear stress EF: 68%. There was no ST segment deviation noted during stress. No T wave inversion was noted during stress. Defect 1: There is a small defect of mild severity present in the mid anterior location. Findings consistent with ischemia. This is a low risk study.   Low risk stress nuclear study with a small area of ischemia, possibly due to a diagonal artery stenosis, otherwise normal perfusion. Normal left ventricular regional and global systolic function.   Carotid duplex 2014 -<50% stenosis bilaterally    Past Medical History:  Diagnosis Date   Actinic keratoses    and seborrheic keratoses   Arrhythmia    afib postoperatively after CABG in 2004, No recurrence   Coronary artery disease    Diabetes mellitus without complication ([facility])    diet controlled   Dyslipidemia    ED (erectile dysfunction)    Elevated PSA    2010, workup with Dr. Risa Grill   H/O prostate biopsy    Hypertension    Mild carotid artery disease ([place])    Onychomycosis    Prostate cancer ([place])    PSA's run approx 7- 2011-2013   Right shoulder injury    july 2014, Dr. Lennette Bihari Supple   Tick bite of abdomen  2013, treated with 3wks of doxycycline, tick engorged 3.80m nodule left thyroid 4/14- seen on carotid u/s    Past Surgical History:  Procedure Laterality Date   CARDIAC SURGERY     CATARACT EXTRACTION, BILATERAL     CORONARY ARTERY BYPASS GRAFT  2004    Current Medications: Current Meds  Medication Sig   acetaminophen (TYLENOL) 325 MG tablet Take 325 mg by mouth every 4 (four) hours as needed for mild pain or headache.   aspirin EC 81 MG tablet Take 1 tablet (81 mg total) by mouth daily.   Carbidopa-Levodopa ER (SINEMET CR) 25-100 MG tablet controlled  release 1.5 tabs at 7am/11am/4pm   cholecalciferol (VITAMIN D) 1000 UNITS tablet Take 1,000 Units by mouth daily.   Cholecalciferol (VITAMIN D3) 50 MCG (2000 UT) capsule    Coenzyme Q10 (COQ-10) 100 MG CAPS Take 1 capsule by mouth daily.   Cyanocobalamin (B-12) 5000 MCG CAPS Take 1 tablet by mouth 3 (three) times a week.   naproxen sodium (ALEVE) 220 MG tablet Take 220 mg by mouth daily as needed (pain).   nitroGLYCERIN (NITROSTAT) 0.4 MG SL tablet Place 1 tablet (0.4 mg total) under the tongue every 5 (five) minutes as needed for chest pain.   Omega-3 Fatty Acids (FISH OIL) 1000 MG CAPS Take 1,000 mg by mouth 2 (two) times daily after a meal.    rosuvastatin (CRESTOR) 5 MG tablet Take 5 mg by mouth. Take one tablet by mouth Monday through Friday.   TURMERIC PO Take 1 capsule by mouth 2 (two) times daily.    [DISCONTINUED] furosemide (LASIX) 20 MG tablet      Allergies:   Chocolate flavor and Food   Social History   Socioeconomic History   Marital status: Married    Spouse name: Not on file   Number of children: 2   Years of education: 16   Highest education level: Not on file  Occupational History   Occupation: retired  Tobacco Use   Smoking status: Never   Smokeless tobacco: Never  Vaping Use   Vaping Use: Never used  Substance and Sexual Activity   Alcohol use: No   Drug use: No   Sexual activity: Not Currently  Other Topics Concern   Not on file  Social History Narrative   Right handed   One story home   One cup coffee qam   Social Determinants of Health   Financial Resource Strain: Not on file  Food Insecurity: Not on file  Transportation Needs: Not on file  Physical Activity: Not on file  Stress: Not on file  Social Connections: Not on file     Family History:  The patient's family history includes Breast cancer in his child; CAD in his father; CVA in his father; Diabetes in his brother; Heart attack in his brother; Hypertension in his father; Stroke in his  mother and sister.  ROS:   Please see the history of present illness.  All other systems are reviewed and otherwise negative.    EKG(s)/Additional Labs   EKG:  EKG is ordered today, personally reviewed, demonstrating NSR 1st degree AVB 69bpm RBBB, possible prior inferior infarct, baseline tremor - no acute change from prior  Recent Labs: 02/02/2021: BUN 26; Creatinine, Ser 1.41; Hemoglobin 13.1; Platelets 138; Potassium 4.7; Sodium 136  Recent Lipid Panel No results found for: "CHOL", "TRIG", "HDL", "CHOLHDL", "VLDL", "LDLCALC", "LDLDIRECT"  PHYSICAL EXAM:    VS:  BP (!) 140/80   Pulse 69   Ht  $'5\' 10"'z$  (1.778 m)   Wt 173 lb 9.6 oz (78.7 kg)   SpO2 96%   BMI 24.91 kg/m   BMI: Body mass index is 24.91 kg/m.  GEN: Well nourished, well developed male in no acute distress HEENT: normocephalic, atraumatic Neck: no JVD, carotid bruits, or masses Cardiac: RRR; 2/6 SEM RUSB, no rubs or gallops, no edema  Respiratory:  clear to auscultation bilaterally, normal work of breathing GI: soft, nontender, nondistended, + BS MS: no deformity or atrophy Skin: warm and dry, no rash Neuro:  Alert and Oriented x 3, Strength and sensation are intact, follows commands Psych: euthymic mood, full affect  Wt Readings from Last 3 Encounters:  01/07/22 173 lb 9.6 oz (78.7 kg)  09/11/21 177 lb (80.3 kg)  03/24/21 179 lb 9.6 oz (81.5 kg)     ASSESSMENT & PLAN:   1. CAD, HLD - overall doing well without recurrent angina. Continue medical management of coronary disease with ASA and rosuvastatin. He is not on beta blocker at this time with labile HTN swings.  2. H/o post-op atrial fibrillation - quiescent. Will also obtain copy of updated EKG from previous [facility] visit from 12/2021.  3. Essential HTN, labile - suspect the previous wide swings to be a sequalae of his Parkinson's disease and autonomic dysfunction, exacerbated by poor oral intake the day of fall in December. However, we also need to exclude  significant aortic stenosis. He also has a systolic murmur on exam so will pursue echo. PCP checked labs that looked relatively stable.  Given his advanced age and falls, I think a blood pressure of <638 systolic is acceptable therefore would not add any medications at this time. I recommended use of compression hose and adequate hydration. If orthostasis becomes a recurrent issue, may need to consider abdominal binder +/- midodrine and accept a higher baseline BP.  HYPERTENSION CONTROL Vitals:   01/07/22 1016 01/07/22 1100  BP: (!) 148/72 (!) 140/80    The patient's blood pressure is elevated above target today.  In order to address the patient's elevated BP: Not appropriate to add medicine at this time as above.     4. Mild carotid artery disease by very remote duplex - given advanced age and lack of carotid bruits, reasonable to follow clinically for now without repeat testing. Remains on ASA. No stroke/TIA hx.  5. RBBB/first degree AVB - chronic appearing. We will see if we can get a copy of the EKG from recent PCP visit to ensure they did not see anything different. Heart rate is normal. May be best to avoid AVN blocking agents in the future so as not to worsen conduction disease.  6. Systolic murmur - obtain echocardiogram. Aortic stenosis is in the differential.     Disposition: F/u with me in 8 weeks.   Medication Adjustments/Labs and Tests Ordered: Current medicines are reviewed at length with the patient today.  Concerns regarding medicines are outlined above. Medication changes, Labs and Tests ordered today are summarized above and listed in the Patient Instructions accessible in Encounters.   Signed, Charlie Pitter, PA-C  01/07/2022 11:00 AM    [place] Phone: [phone]; Fax: [phone]

## 2022-01-07 ENCOUNTER — Encounter: Payer: Self-pay | Admitting: Physician Assistant

## 2022-01-07 ENCOUNTER — Ambulatory Visit: Payer: Medicare Other | Attending: Physician Assistant | Admitting: Physician Assistant

## 2022-01-07 VITALS — BP 140/80 | HR 69 | Ht 70.0 in | Wt 173.6 lb

## 2022-01-07 DIAGNOSIS — I4510 Unspecified right bundle-branch block: Secondary | ICD-10-CM | POA: Diagnosis not present

## 2022-01-07 DIAGNOSIS — I2510 Atherosclerotic heart disease of native coronary artery without angina pectoris: Secondary | ICD-10-CM

## 2022-01-07 DIAGNOSIS — I4891 Unspecified atrial fibrillation: Secondary | ICD-10-CM

## 2022-01-07 DIAGNOSIS — I9789 Other postprocedural complications and disorders of the circulatory system, not elsewhere classified: Secondary | ICD-10-CM | POA: Diagnosis not present

## 2022-01-07 DIAGNOSIS — E785 Hyperlipidemia, unspecified: Secondary | ICD-10-CM

## 2022-01-07 DIAGNOSIS — I10 Essential (primary) hypertension: Secondary | ICD-10-CM

## 2022-01-07 DIAGNOSIS — R011 Cardiac murmur, unspecified: Secondary | ICD-10-CM | POA: Diagnosis not present

## 2022-01-07 DIAGNOSIS — I779 Disorder of arteries and arterioles, unspecified: Secondary | ICD-10-CM

## 2022-01-07 NOTE — Patient Instructions (Signed)
Medication Instructions:  Your physician recommends that you continue on your current medications as directed. Please refer to the Current Medication list given to you today.  *If you need a refill on your cardiac medications before your next appointment, please call your pharmacy*   Testing/Procedures: Your physician has requested that you have an echocardiogram. Echocardiography is a painless test that uses sound waves to create images of your heart. It provides your doctor with information about the size and shape of your heart and how well your heart's chambers and valves are working. This procedure takes approximately one hour. There are no restrictions for this procedure. Please do NOT wear cologne, perfume, aftershave, or lotions (deodorant is allowed). Please arrive 15 minutes prior to your appointment time.    Follow-Up: At [facility], you and your health needs are our priority.  As part of our continuing mission to provide you with exceptional heart care, we have created designated Provider Care Teams.  These Care Teams include your primary Cardiologist (physician) and Advanced Practice Providers (APPs -  Physician Assistants and Nurse Practitioners) who all work together to provide you with the care you need, when you need it.  We recommend signing up for the patient portal called "MyChart".  Sign up information is provided on this After Visit Summary.  MyChart is used to connect with patients for Virtual Visits (Telemedicine).  Patients are able to view lab/test results, encounter notes, upcoming appointments, etc.  Non-urgent messages can be sent to your provider as well.   To learn more about what you can do with MyChart, go to [email or web address].    Your next appointment:   8 week(s)  The format for your next appointment:   In Person  Provider:   Melina Copa, PA-C          Important Information About Sugar

## 2022-01-15 ENCOUNTER — Ambulatory Visit: Payer: Medicare Other | Admitting: Physical Therapy

## 2022-01-15 ENCOUNTER — Ambulatory Visit: Payer: Medicare Other | Attending: Neurology | Admitting: Occupational Therapy

## 2022-01-15 DIAGNOSIS — R2689 Other abnormalities of gait and mobility: Secondary | ICD-10-CM | POA: Insufficient documentation

## 2022-01-15 DIAGNOSIS — C61 Malignant neoplasm of prostate: Secondary | ICD-10-CM | POA: Diagnosis not present

## 2022-01-15 NOTE — Therapy (Signed)
[facility] [address], [place], [place], [address] Phone: [phone]   Fax:  [phone]  Patient Details  Name: Johnny Fox MRN: [id number] Date of Birth: 06-11-1928 Referring Provider:  Josetta Huddle, MD  Encounter Date: 01/15/2022  Occupational Therapy Parkinson's Disease Screen  Hand dominance:  Right   Physical Performance Test item #2 (simulated eating):  17.15 sec  Fastening/unfastening 3 buttons in:  37.34 sec  9-hole peg test:    RUE  34.76 sec        LUE  38.22 sec  Standing Functional Reach Test:   RUE  5 inches        LUE  6 inches  Other Comments:  Reports noticing improvements with self-feeding and is utilizing strategies from previous therapy sessions  Pt does not require occupational therapy services at this time.  Recommended occupational therapy screen in   6-9 months   [place], [place], OT 01/15/2022, 11:57 AM  [facility] [address] [place], [place], [address] Phone: [phone]   Fax:  [phone]

## 2022-01-15 NOTE — Therapy (Signed)
[facility] [address], [place], [place], [address] Phone: [phone]   Fax:  [phone]  Patient Details  Name: Johnny Fox MRN: [id number] Date of Birth: September 11, 1928 Referring Provider:  Josetta Huddle, MD  Encounter Date: 01/15/2022  Physical Therapy Parkinson's Disease Screen   Timed Up and Go test:13.31 sec compared to 13.44 sec  10 meter walk test:11.25 sec = 2.92 ft/sec  5 time sit to stand test:15.09 sec compared to 17.03 sec   Patient does not require Physical Therapy services at this time.  Recommend Physical Therapy screen in 6-9 months.  *Goes to [employer] and uses bike fairly frequently.  Has had one fall, after getting up quickly from being asleep.  No injury  Jaimes Eckert W., PT 01/15/2022, 7:48 AM  [facility] [address] [place], [place], [address] Phone: [phone]   Fax:  [phone]

## 2022-01-22 DIAGNOSIS — C61 Malignant neoplasm of prostate: Secondary | ICD-10-CM | POA: Diagnosis not present

## 2022-01-30 ENCOUNTER — Other Ambulatory Visit (HOSPITAL_COMMUNITY): Payer: Self-pay | Admitting: Urology

## 2022-01-30 DIAGNOSIS — C61 Malignant neoplasm of prostate: Secondary | ICD-10-CM

## 2022-02-10 ENCOUNTER — Ambulatory Visit (HOSPITAL_COMMUNITY): Payer: Medicare Other | Attending: Physician Assistant

## 2022-02-10 DIAGNOSIS — R011 Cardiac murmur, unspecified: Secondary | ICD-10-CM

## 2022-02-10 DIAGNOSIS — I2510 Atherosclerotic heart disease of native coronary artery without angina pectoris: Secondary | ICD-10-CM

## 2022-02-10 DIAGNOSIS — I4891 Unspecified atrial fibrillation: Secondary | ICD-10-CM | POA: Insufficient documentation

## 2022-02-10 DIAGNOSIS — I9789 Other postprocedural complications and disorders of the circulatory system, not elsewhere classified: Secondary | ICD-10-CM

## 2022-02-10 LAB — ECHOCARDIOGRAM COMPLETE
Area-P 1/2: 2.7 cm2
S' Lateral: 2.8 cm

## 2022-02-19 ENCOUNTER — Encounter (HOSPITAL_COMMUNITY)
Admission: RE | Admit: 2022-02-19 | Discharge: 2022-02-19 | Disposition: A | Discharge status: Home / Self Care | Payer: Medicare Other | Source: Ambulatory Visit | Attending: Urology | Admitting: Urology

## 2022-02-19 DIAGNOSIS — K449 Diaphragmatic hernia without obstruction or gangrene: Secondary | ICD-10-CM | POA: Diagnosis not present

## 2022-02-19 DIAGNOSIS — C61 Malignant neoplasm of prostate: Secondary | ICD-10-CM | POA: Diagnosis not present

## 2022-02-19 DIAGNOSIS — K4020 Bilateral inguinal hernia, without obstruction or gangrene, not specified as recurrent: Secondary | ICD-10-CM | POA: Diagnosis not present

## 2022-02-19 DIAGNOSIS — K8020 Calculus of gallbladder without cholecystitis without obstruction: Secondary | ICD-10-CM | POA: Diagnosis not present

## 2022-02-19 DIAGNOSIS — R972 Elevated prostate specific antigen [PSA]: Secondary | ICD-10-CM | POA: Diagnosis not present

## 2022-02-19 MED ORDER — PIFLIFOLASTAT F 18 (PYLARIFY) INJECTION
9.0000 | Freq: Once | INTRAVENOUS | Status: AC
  Administered 2022-02-19: 9.7 via INTRAVENOUS

## 2022-02-26 DIAGNOSIS — C61 Malignant neoplasm of prostate: Secondary | ICD-10-CM | POA: Diagnosis not present

## 2022-03-08 ENCOUNTER — Other Ambulatory Visit: Payer: Self-pay | Admitting: Neurology

## 2022-03-23 NOTE — Progress Notes (Unsigned)
Assessment/Plan:   1.  ET/PD   -DaTscan abnormal with evidence of dopamine loss  -continue carbidopa/levodopa 25/100 CR, 1.5 tablet 3 times per day  -We discussed that it used to be thought that levodopa would increase risk of melanoma but now it is believed that Parkinsons itself likely increases risk of melanoma. he is to get regular skin checks.      2.  Gait disorder, multifactorial  -Likely from peripheral neuropathy, cerebellar outflow tracts from essential tremor and potentially even Parkinson's.  -discussed that foot numbness from PN and not from Parkinsons Disease   3.  Urinary incontinence  -discussed myrbetriq and gemtesa.  They can discuss with pcp  4.  Some GAD  -discussed meds but they decided its mostly related to having wife drive since he isn't driving  5.  ?Syncope  -following with Dr. Irish Lack   -likely due to Neurogenic Orthostatic Hypotension but was unwitnessed  -told them to check BP's in various positions  -we will schedule EEG  Subjective:   Johnny Fox was seen today in follow up for ET/Parkinsons disease.  This patient is accompanied in the office by his spouse who supplements the history.  Wife with patient and supplements hx. patient has been to some therapy since our last visit.  Those notes are reviewed.  We did slightly increase his levodopa last visit.  He has had 1 fall.  He was getting out of a soft chair and got up too fast and actually thinks he passed out.  He scattered things all over the kitchen and he doesn't remember it.  Wife wasn't home and when she got home she asked him about it and he didn't recall anything.  She took his BP and it was 99991111 systolic.  She took him to the pcp UC and they sent him to cardiologist, Dr. Irish Lack.    They saw his PA.  No meds were added.   He did have an echo.  They told me today that his BP's have been running normal at home.  No hallucinations.    Current prescribed movement disorder  medications: Carbidopa/levodopa 25/100 CR, 1.5 tablet 3 times per day    PREVIOUS MEDICATIONS: Sinemet (just changed to see if it help fatigue - it didn't)  ALLERGIES:   Allergies  Allergen Reactions   Chocolate Flavor Other (See Comments)    sneezing   Food Other (See Comments)    Chocolate-Sneezing    CURRENT MEDICATIONS:  Outpatient Encounter Medications as of 03/25/2022  Medication Sig   acetaminophen (TYLENOL) 325 MG tablet Take 325 mg by mouth every 4 (four) hours as needed for mild pain or headache.   aspirin EC 81 MG tablet Take 1 tablet (81 mg total) by mouth daily.   Carbidopa-Levodopa ER (SINEMET CR) 25-100 MG tablet controlled release TAKE 1.5 TABLETS BY MOUTH 7 IN THE MORNING THEN TAKE 1.5 TABLETS BY MOUTH AT 11 IN THE MORNING THEN TAKE 1.5 TABLETS BY MOUTH AT 4 IN THE EVENING   cholecalciferol (VITAMIN D) 1000 UNITS tablet Take 1,000 Units by mouth daily.   Cholecalciferol (VITAMIN D3) 50 MCG (2000 UT) capsule    Coenzyme Q10 (COQ-10) 100 MG CAPS Take 1 capsule by mouth daily.   Cyanocobalamin (B-12) 5000 MCG CAPS Take 1 tablet by mouth 3 (three) times a week.   nitroGLYCERIN (NITROSTAT) 0.4 MG SL tablet Place 1 tablet (0.4 mg total) under the tongue every 5 (five) minutes as needed for chest pain.  Omega-3 Fatty Acids (FISH OIL) 1000 MG CAPS Take 1,000 mg by mouth 2 (two) times daily after a meal.    rosuvastatin (CRESTOR) 5 MG tablet Take 5 mg by mouth. Take one tablet by mouth Monday through Friday.   TURMERIC PO Take 1 capsule by mouth 2 (two) times daily.    [DISCONTINUED] naproxen sodium (ALEVE) 220 MG tablet Take 220 mg by mouth daily as needed (pain).   No facility-administered encounter medications on file as of 03/25/2022.    Objective:   PHYSICAL EXAMINATION:    VITALS:   Vitals:   03/25/22 0937  BP: 126/74  Pulse: 61  SpO2: 97%  Height: 5\' 10"  (1.778 m)    GEN:  The patient appears stated age and is in NAD. HEENT:  Normocephalic, atraumatic.   The mucous membranes are moist. The superficial temporal arteries are without ropiness or tenderness. CV:  RRR Lungs:  CTAB Neck/HEME:  There are no carotid bruits bilaterally.  Neurological examination:  Orientation: The patient is alert and oriented x3. Cranial nerves: There is good facial symmetry with facial hypomimia. The speech is fluent and clear. Soft palate rises symmetrically and there is no tongue deviation. Hearing is decreased to conversational tone. Sensation: Sensation is intact to light touch throughout Motor: Strength is at least antigravity x4.  Movement examination: Tone: there is normal tone in the UE/LE Abnormal movements: rare rest tremor of the R thumb Coordination:  There is mild  decremation with RAM's, with any form of RAMS, including alternating supination and pronation of the forearm, hand opening and closing, finger taps, heel taps and toe taps on the left. Gait and Station: The patient has min difficulty arising out of a deep-seated chair without the use of the hands.  He turns en bloc.  Ambulates with the cane.   Total time spent on today's visit was 30 minutes, including both face-to-face time and nonface-to-face time.  Time included that spent on review of records (prior notes available to me/labs/imaging if pertinent), discussing treatment and goals, answering patient's questions and coordinating care.  Cc:  Kathalene Frames, MD

## 2022-03-25 ENCOUNTER — Encounter: Payer: Self-pay | Admitting: Neurology

## 2022-03-25 ENCOUNTER — Encounter: Payer: Self-pay | Admitting: Podiatry

## 2022-03-25 ENCOUNTER — Ambulatory Visit (INDEPENDENT_AMBULATORY_CARE_PROVIDER_SITE_OTHER): Payer: Medicare Other | Admitting: Podiatry

## 2022-03-25 ENCOUNTER — Ambulatory Visit (INDEPENDENT_AMBULATORY_CARE_PROVIDER_SITE_OTHER): Payer: Medicare Other | Admitting: Neurology

## 2022-03-25 VITALS — BP 126/74 | HR 61 | Ht 70.0 in

## 2022-03-25 DIAGNOSIS — G903 Multi-system degeneration of the autonomic nervous system: Secondary | ICD-10-CM | POA: Diagnosis not present

## 2022-03-25 DIAGNOSIS — B351 Tinea unguium: Secondary | ICD-10-CM | POA: Diagnosis not present

## 2022-03-25 DIAGNOSIS — M79676 Pain in unspecified toe(s): Secondary | ICD-10-CM

## 2022-03-25 DIAGNOSIS — G20A1 Parkinson's disease without dyskinesia, without mention of fluctuations: Secondary | ICD-10-CM

## 2022-03-25 DIAGNOSIS — R55 Syncope and collapse: Secondary | ICD-10-CM

## 2022-03-25 DIAGNOSIS — E119 Type 2 diabetes mellitus without complications: Secondary | ICD-10-CM

## 2022-03-25 NOTE — Progress Notes (Signed)
This patient returns to my office for at risk foot care.  This patient requires this care by a professional since this patient will be at risk due to having  Diabetic neuropathy and Parkinsons.  This patient is unable to cut nails himself since the patient cannot reach his nails.These nails are painful walking and wearing shoes.  This patient presents for at risk foot care today.  General Appearance  Alert, conversant and in no acute stress.  Vascular  Dorsalis pedis and posterior tibial  pulses are palpable  bilaterally.  Capillary return is within normal limits  bilaterally. Temperature is within normal limits  bilaterally.  Neurologic  Senn-Weinstein monofilament wire diminished  bilaterally. Muscle power within normal limits bilaterally.  Nails Thick disfigured discolored nails with subungual debris  from hallux to fifth toes bilaterally. No evidence of bacterial infection or drainage bilaterally.  Orthopedic  No limitations of motion  feet .  No crepitus or effusions noted.  No bony pathology or digital deformities noted.  Skin  normotropic skin with no porokeratosis noted bilaterally.  No signs of infections or ulcers noted.     Onychomycosis  Pain in right toes  Pain in left toes  Consent was obtained for treatment procedures.   Mechanical debridement of nails 1-5  bilaterally performed with a nail nipper.  Filed with dremel without incident.    Return office visit   3 months                   Told patient to return for periodic foot care and evaluation due to potential at risk complications.   Kiven Vangilder DPM  

## 2022-04-01 ENCOUNTER — Ambulatory Visit (INDEPENDENT_AMBULATORY_CARE_PROVIDER_SITE_OTHER): Payer: Medicare Other | Admitting: Neurology

## 2022-04-01 DIAGNOSIS — R55 Syncope and collapse: Secondary | ICD-10-CM

## 2022-04-01 NOTE — Progress Notes (Signed)
EEG complete - results pending 

## 2022-04-01 NOTE — Procedures (Signed)
TECHNICAL SUMMARY:  A multichannel referential and bipolar montage EEG using the standard international 10-20 system was performed on the patient described as awake and drowsy.  The dominant background activity consists of 8-9 hertz activity seen most prominantly over the posterior head region.  The backgound activity is reactive to eye opening and closing procedures.  Low voltage fast (beta) activity is distributed symmetrically and maximally over the anterior head regions.  ACTIVATION:  Stepwise photic stimulation at 4-20 flashes per second was performed and did not elicit any abnormal waveforms.  Hyperventilation was not performed.  EPILEPTIFORM ACTIVITY:  There were no spikes, sharp waves or paroxysmal activity.  SLEEP: Physiologic drowsiness is noted, but no stage II sleep.  CARDIAC:  The EKG lead revealed a regular rhythm  IMPRESSION:  This is a normal EEG for the patients stated age.  There were no focal, hemispheric or lateralizing features.  No epileptiform activity was recorded.  A normal EEG does not exclude the diagnosis of a seizure disorder and if seizure remains high on the list of differential diagnosis, an ambulatory EEG may be of value.  Clinical correlation is required.

## 2022-04-24 DIAGNOSIS — D485 Neoplasm of uncertain behavior of skin: Secondary | ICD-10-CM | POA: Diagnosis not present

## 2022-04-24 DIAGNOSIS — C44329 Squamous cell carcinoma of skin of other parts of face: Secondary | ICD-10-CM | POA: Diagnosis not present

## 2022-04-24 DIAGNOSIS — R229 Localized swelling, mass and lump, unspecified: Secondary | ICD-10-CM | POA: Diagnosis not present

## 2022-04-24 DIAGNOSIS — Z85828 Personal history of other malignant neoplasm of skin: Secondary | ICD-10-CM | POA: Diagnosis not present

## 2022-04-24 DIAGNOSIS — L821 Other seborrheic keratosis: Secondary | ICD-10-CM | POA: Diagnosis not present

## 2022-04-24 DIAGNOSIS — Z08 Encounter for follow-up examination after completed treatment for malignant neoplasm: Secondary | ICD-10-CM | POA: Diagnosis not present

## 2022-04-24 DIAGNOSIS — C44229 Squamous cell carcinoma of skin of left ear and external auricular canal: Secondary | ICD-10-CM | POA: Diagnosis not present

## 2022-04-24 DIAGNOSIS — L570 Actinic keratosis: Secondary | ICD-10-CM | POA: Diagnosis not present

## 2022-05-04 NOTE — Progress Notes (Unsigned)
Cardiology Office Note    Date:  05/05/2022   ID:  Johnny Fox, DOB 1928-06-22, MRN [id number]  PCP:  Emilio Aspen, MD  Cardiologist:  Lance Muss, MD  Electrophysiologist:  None   Chief Complaint: f/u fall  History of Present Illness:   Johnny Fox is a 89 y.o. male with history of CAD s/p CABG 2004 with post-op afib, dyslipidemia, RBBB/first degree AVB, moderate MR, mild AI, borderline dilation of ascending aorta, ED, prostate CA, HTN, Parkinson's disease, diet controlled DM, CKD stage 3a by last labs, mild carotid artery disease by duplex 2014, exercise limited by neuropathic pain/balance who is seen for follow-up. Per Dr. Hoyle Barr notes, had minimal symptoms prior to CABG, CAD picked up on stress testing with some DOE/minimal angina at that time. Last ischemic eval was by nuc in 01/2021 which small defect in mid anterior location c/w ischemia, low risk study, EF 68%, managed medically. At OV 01/2021, amlodipine was added. However, subsequent to this he had issues with low BP in the morning so was given an rx for hydralazine PRN.   I saw him 01/07/22 to follow up a mechanical fall earlier in December on a day when he'd had poor oral intake and stood up quickly the fell. Wife checked BP and it was in the 80s. Saw PCP and hydralazine was stopped. Wife also clarified he is no longer on Lasix, does not recall when stopped. [facility] labs showed stable Hgb and creatinine. At our office in 01/2022, was in NSR with known RBBB, first degree AVB. Systolic murmur was noted on exam. 2D echo was performed 02/10/22 showing EF 55-60%, mild LVH, G1DD, mildly elevated PASP, moderate MR, mild AI, aortic sclerosis without stenosis, borderline dilation of ascending aorta.   He is seen for follow-up today doing well without acute concerns. He has not had any recurrent falls. No CP, dyspnea, palpitations, or syncope. He uses a cane to steady himself. He participates in a Parkinson's class at  [employer]   Labwork independently reviewed: 12/15/21 [facility] BUN 30, Cr 1.550, Hgb 13.2, Plt 164, K 4.6, TSH wnl 03/2021 [facility] PCP LDL 45, trig 123 01/2021 troponin neg, Hgb 13.1, plt 138, K 4.7, Cr 1.41, glucose 116 2017 Cr 1.67, Hgb 13.8, plt 183/163? (Scan quality)   Past History   Past Medical History:  Diagnosis Date   Actinic keratoses    and seborrheic keratoses   Arrhythmia    afib postoperatively after CABG in 2004, No recurrence   Coronary artery disease    Diabetes mellitus without complication (HCC)    diet controlled   Dyslipidemia    ED (erectile dysfunction)    Elevated PSA    2010, workup with Dr. Isabel Caprice   H/O prostate biopsy    Hypertension    Mild carotid artery disease (HCC)    Onychomycosis    Prostate cancer (HCC)    PSA's run approx 7- 2011-2013   Right shoulder injury    july 2014, Dr. Caryn Bee Supple   Tick bite of abdomen    2013, treated with 3wks of doxycycline, tick engorged 3.74mm nodule left thyroid 4/14- seen on carotid u/s    Past Surgical History:  Procedure Laterality Date   CARDIAC SURGERY     CATARACT EXTRACTION, BILATERAL     CORONARY ARTERY BYPASS GRAFT  2004    Current Medications: Current Meds  Medication Sig   acetaminophen (TYLENOL) 325 MG tablet Take 325 mg by mouth every 4 (four) hours  as needed for mild pain or headache.   aspirin EC 81 MG tablet Take 1 tablet (81 mg total) by mouth daily.   Carbidopa-Levodopa ER (SINEMET CR) 25-100 MG tablet controlled release TAKE 1.5 TABLETS BY MOUTH 7 IN THE MORNING THEN TAKE 1.5 TABLETS BY MOUTH AT 11 IN THE MORNING THEN TAKE 1.5 TABLETS BY MOUTH AT 4 IN THE EVENING   Cholecalciferol (VITAMIN D3) 50 MCG (2000 UT) capsule    Coenzyme Q10 (COQ-10) 100 MG CAPS Take 1 capsule by mouth daily.   Cyanocobalamin (B-12) 5000 MCG CAPS Take 1 tablet by mouth 3 (three) times a week.   nitroGLYCERIN (NITROSTAT) 0.4 MG SL tablet Place 1 tablet (0.4 mg total) under the tongue every 5 (five) minutes  as needed for chest pain.   Omega-3 Fatty Acids (FISH OIL) 1000 MG CAPS Take 1,000 mg by mouth 2 (two) times daily after a meal.    rosuvastatin (CRESTOR) 5 MG tablet Take 5 mg by mouth daily. Take one tablet by mouth Mon, Tue, Thurs, Fri, and Sat.   TURMERIC PO Take 1 capsule by mouth 2 (two) times daily.       Allergies:   Chocolate flavor and Food   Social History   Socioeconomic History   Marital status: Married    Spouse name: Not on file   Number of children: 2   Years of education: 16   Highest education level: Not on file  Occupational History   Occupation: retired  Tobacco Use   Smoking status: Never   Smokeless tobacco: Never  Vaping Use   Vaping Use: Never used  Substance and Sexual Activity   Alcohol use: No   Drug use: No   Sexual activity: Not Currently  Other Topics Concern   Not on file  Social History Narrative   Right handed   One story home   One cup coffee qam   Social Determinants of Health   Financial Resource Strain: Not on file  Food Insecurity: Not on file  Transportation Needs: Not on file  Physical Activity: Not on file  Stress: Not on file  Social Connections: Not on file     Family History:  The patient's family history includes Breast cancer in his child; CAD in his father; CVA in his father; Diabetes in his brother; Heart attack in his brother; Hypertension in his father; Stroke in his mother and sister.  ROS:   Please see the history of present illness.  All other systems are reviewed and otherwise negative.    EKG(s)/Additional Testing   EKG:  EKG is ordered today, NSR/SA 66bpm, first degree AVB, RBBB, prior inferior infarct. No acute STT changes from prior.  CV Studies: Cardiac studies reviewed are outlined and summarized above. Otherwise please see EMR for full report.  Recent Labs: No results found for requested labs within last 365 days.  Recent Lipid Panel No results found for: "CHOL", "TRIG", "HDL", "CHOLHDL",  "VLDL", "LDLCALC", "LDLDIRECT"  PHYSICAL EXAM:    VS:  BP 128/82 (BP Location: Left Arm, Patient Position: Sitting, Cuff Size: Normal)   Pulse 67   Ht 5\' 10"  (1.778 m)   Wt 178 lb 3.2 oz (80.8 kg)   SpO2 98%   BMI 25.57 kg/m   BMI: Body mass index is 25.57 kg/m.  GEN: Well nourished, well developed male in no acute distress HEENT: normocephalic, atraumatic Neck: no JVD, carotid bruits, or masses Cardiac: RRR; +SEM, no rubs or gallops, mild sockline edema  Respiratory:  clear to auscultation bilaterally, normal work of breathing GI: soft, nontender, nondistended, + BS MS: no deformity or atrophy Skin: warm and dry, no rash Neuro:  Alert and Oriented x 3, Strength and sensation are intact, follows commands Psych: euthymic mood, full affect  Wt Readings from Last 3 Encounters:  05/05/22 178 lb 3.2 oz (80.8 kg)  01/07/22 173 lb 9.6 oz (78.7 kg)  09/11/21 177 lb (80.3 kg)     ASSESSMENT & PLAN:   1. Fall with hypotension, prior hx of HTN - no recurrent falling since 12/2021 event. BP has been stable. Uses cane for ambulation. At last OV we had requested copy of the EKG from PCP's office from 12/2021. I do not see we received this, will ask nurse to request again along with platelet count from the labs from that encounter (omitted from [facility] though Hgb was normal). Addendum: shortly after the visit was completed, I received copy of EKG from PCP OV 12/2021 which demonstrated atrial flutter with HR 68bpm. It is unclear whether this was an isolated event related to fall/hypotension event or something he has been experiencing in a paroxysmal nature. When in a-flutter EKG his rhythm was very regular as well, so may not be able to be easily discerned on examination. We had Mr. Shughart return for an EKG which showed continued NSR/SA with known first degree AVB, RBBB. We'll plan for a 2 week non-live Zio for evaluation of arrhythmia burden and update CBC/BMET today - will reach out to Dr. Eldridge Dace  for input on anticoagulation. Recent TSH in 12/2021 was normal.   2nd addendum: EKG reviewed with Dr. Eldridge Dace who recommends hold off starting anticoagulation due to bleeding risk, isolated episode. Await monitor. I also added PCP EKG to Media tab under photos.  2. H/o post-op atrial fib  - initially felt to be quiescent without recurrence but we received the EKG from 12/2021 which showed rate controlled atrial flutter. Plan Zio as above.  3. CAD, HLD - no recurrent angina. Continue ASA, rosuvastatin. He reports lipids have been followed by PCP - has physical coming up in June with labs at that time. He is not on BB with issues with BP swings.  4. Mild carotid artery disease - given advanced age and lack of carotid bruits, reasonable to follow clinically without repeat testing. Remains on ASA, statin. No TIA/stroke symptoms recently.  5. RBBB, first degree AVB - no recent symptoms of bradycardia. May be best to avoid AVN blocking agents in the future so as not to worsen conduction disease, unless needed for new onset of tachycardia.  6.  Moderate MR, mild AI, borderline dilation of ascending aorta, mildly elevated PASP, aortic sclerosis without stenosis -  suspect murmur caused by aortic sclerosis and mitral regurgitation. He has trace sockline edema on exam, no longer takes Lasix. In setting of prior falling and low BP, would not add back diuretic at this time. Repeat echo would be due in 02/2023 but may be reasonable to monitor for worsening symptoms clinically instead given comorbidities and advanced age. This can be reviewed in followup.    Disposition: F/u with Dr. Eldridge Dace in 01/2023.   Medication Adjustments/Labs and Tests Ordered: Current medicines are reviewed at length with the patient today.  Concerns regarding medicines are outlined above. Medication changes, Labs and Tests ordered today are summarized above and listed in the Patient Instructions accessible in Encounters.    Signed, Laurann [place], PA-C  05/05/2022 10:06 AM    [facility] Phone: [phone]; Fax: [phone]

## 2022-05-05 ENCOUNTER — Ambulatory Visit (INDEPENDENT_AMBULATORY_CARE_PROVIDER_SITE_OTHER): Payer: Medicare Other

## 2022-05-05 ENCOUNTER — Telehealth: Payer: Self-pay | Admitting: Physician Assistant

## 2022-05-05 ENCOUNTER — Ambulatory Visit: Payer: Medicare Other | Attending: Physician Assistant | Admitting: Physician Assistant

## 2022-05-05 ENCOUNTER — Encounter: Payer: Self-pay | Admitting: Physician Assistant

## 2022-05-05 VITALS — BP 128/82 | HR 67 | Ht 70.0 in | Wt 178.2 lb

## 2022-05-05 DIAGNOSIS — I4892 Unspecified atrial flutter: Secondary | ICD-10-CM | POA: Diagnosis not present

## 2022-05-05 DIAGNOSIS — E785 Hyperlipidemia, unspecified: Secondary | ICD-10-CM | POA: Insufficient documentation

## 2022-05-05 DIAGNOSIS — I10 Essential (primary) hypertension: Secondary | ICD-10-CM | POA: Diagnosis not present

## 2022-05-05 DIAGNOSIS — W19XXXD Unspecified fall, subsequent encounter: Secondary | ICD-10-CM

## 2022-05-05 DIAGNOSIS — I351 Nonrheumatic aortic (valve) insufficiency: Secondary | ICD-10-CM | POA: Insufficient documentation

## 2022-05-05 DIAGNOSIS — I9789 Other postprocedural complications and disorders of the circulatory system, not elsewhere classified: Secondary | ICD-10-CM

## 2022-05-05 DIAGNOSIS — I440 Atrioventricular block, first degree: Secondary | ICD-10-CM

## 2022-05-05 DIAGNOSIS — I340 Nonrheumatic mitral (valve) insufficiency: Secondary | ICD-10-CM

## 2022-05-05 DIAGNOSIS — I77810 Thoracic aortic ectasia: Secondary | ICD-10-CM

## 2022-05-05 DIAGNOSIS — I779 Disorder of arteries and arterioles, unspecified: Secondary | ICD-10-CM

## 2022-05-05 DIAGNOSIS — I4891 Unspecified atrial fibrillation: Secondary | ICD-10-CM | POA: Diagnosis not present

## 2022-05-05 DIAGNOSIS — I2510 Atherosclerotic heart disease of native coronary artery without angina pectoris: Secondary | ICD-10-CM

## 2022-05-05 DIAGNOSIS — I4510 Unspecified right bundle-branch block: Secondary | ICD-10-CM | POA: Diagnosis not present

## 2022-05-05 LAB — CBC

## 2022-05-05 NOTE — Telephone Encounter (Signed)
Addressed in clinic, visit finalized.

## 2022-05-05 NOTE — Progress Notes (Unsigned)
Enrolled for [employer] to mail a ZIO XT long term holter monitor to the patients address on file.   Dr. Varanasi to read. 

## 2022-05-05 NOTE — Telephone Encounter (Signed)
Patient is coming back in for EKG based on findings from Dec. EKG at [facility].

## 2022-05-05 NOTE — Patient Instructions (Addendum)
Medication Instructions:   Your physician recommends that you continue on your current medications as directed. Please refer to the Current Medication list given to you today.   *If you need a refill on your cardiac medications before your next appointment, please call your pharmacy*   Lab Work:  TODAY!!!! CBC/BMET  If you have labs (blood work) drawn today and your tests are completely normal, you will receive your results only by: MyChart Message (if you have MyChart) OR A paper copy in the mail If you have any lab test that is abnormal or we need to change your treatment, we will call you to review the results.   Testing/Procedures:  Johnny Fox- Long Term Monitor Instructions  Your physician has requested you wear a ZIO patch monitor for 14 days.  This is a single patch monitor. [employer] supplies one patch monitor per enrollment. Additional stickers are not available. Please do not apply patch if you will be having a Nuclear Stress Test,  Echocardiogram, Cardiac CT, MRI, or Chest Xray during the period you would be wearing the  monitor. The patch cannot be worn during these tests. You cannot remove and re-apply the  ZIO XT patch monitor.  Your ZIO patch monitor will be mailed 3 day USPS to your address on file. It may take 3-5 days  to receive your monitor after you have been enrolled.  Once you have received your monitor, please review the enclosed instructions. Your monitor  has already been registered assigning a specific monitor serial # to you.  Billing and Patient Assistance Program Information  We have supplied [employer] with any of your insurance information on file for billing purposes. [employer] offers a sliding scale Patient Assistance Program for patients that do not have  insurance, or whose insurance does not completely cover the cost of the ZIO monitor.  You must apply for the Patient Assistance Program to qualify for this discounted rate.  To apply, please call  [employer] at [phone], select option 4, select option 2, ask to apply for  Patient Assistance Program. [name] will ask your household income, and how many people  are in your household. They will quote your out-of-pocket cost based on that information.  [employer] will also be able to set up a 88-month, interest-free payment plan if needed.  Applying the monitor   Shave hair from upper left chest.  Hold abrader disc by orange tab. Rub abrader in 40 strokes over the upper left chest as  indicated in your monitor instructions.  Clean area with 4 enclosed alcohol pads. Let dry.  Apply patch as indicated in monitor instructions. Patch will be placed under collarbone on left  side of chest with arrow pointing upward.  Rub patch adhesive wings for 2 minutes. Remove white label marked "1". Remove the white  label marked "2". Rub patch adhesive wings for 2 additional minutes.  While looking in a mirror, press and release button in center of patch. A small green light will  flash 3-4 times. This will be your only indicator that the monitor has been turned on.  Do not shower for the first 24 hours. You may shower after the first 24 hours.  Press the button if you feel a symptom. You will hear a small click. Record Date, Time and  Symptom in the Patient Logbook.  When you are ready to remove the patch, follow instructions on the last 2 pages of Patient  Logbook. Stick patch monitor onto the last page of Patient  Logbook.  Place Patient Logbook in the blue and white box. Use locking tab on box and tape box closed  securely. The blue and white box has prepaid postage on it. Please place it in the mailbox as  soon as possible. Your physician should have your test results approximately 7 days after the  monitor has been mailed back to [facility].  Call [facility] Customer Care at [phone] if you have questions regarding  your ZIO XT patch monitor. Call them immediately if you see an orange  light blinking on your  monitor.  If your monitor falls off in less than 4 days, contact our Monitor department at [phone].  If your monitor becomes loose or falls off after 4 days call [employer] at [phone] for  suggestions on securing your monitor   Follow-Up: At [facility], you and your health needs are our priority.  As part of our continuing mission to provide you with exceptional heart care, we have created designated Provider Care Teams.  These Care Teams include your primary Cardiologist (physician) and Advanced Practice Providers (APPs -  Physician Assistants and Nurse Practitioners) who all work together to provide you with the care you need, when you need it.  We recommend signing up for the patient portal called "MyChart".  Sign up information is provided on this After Visit Summary.  MyChart is used to connect with patients for Virtual Visits (Telemedicine).  Patients are able to view lab/test results, encounter notes, upcoming appointments, etc.  Non-urgent messages can be sent to your provider as well.   To learn more about what you can do with MyChart, go to [email or web address].    Your next appointment:   10 WEEKS  Provider:   Ronie Spies, PA-C  Other Instructions  Your physician wants you to follow-up in: 9 month follow up with Dr. Eldridge Dace.  You will receive a reminder letter in the mail two months in advance. If you don't receive a letter, please call our office to schedule the follow-up appointment.

## 2022-05-06 ENCOUNTER — Telehealth: Payer: Self-pay | Admitting: Interventional Cardiology

## 2022-05-06 LAB — BASIC METABOLIC PANEL
BUN/Creatinine Ratio: 19 (ref 10–24)
BUN: 24 mg/dL (ref 10–36)
CO2: 21 mmol/L (ref 20–29)
Calcium: 9.2 mg/dL (ref 8.6–10.2)
Chloride: 100 mmol/L (ref 96–106)
Creatinine, Ser: 1.28 mg/dL — ABNORMAL HIGH (ref 0.76–1.27)
Glucose: 84 mg/dL (ref 70–99)
Potassium: 4.5 mmol/L (ref 3.5–5.2)
Sodium: 137 mmol/L (ref 134–144)
eGFR: 53 mL/min/{1.73_m2} — ABNORMAL LOW (ref >59)

## 2022-05-06 LAB — CBC
Hematocrit: 39.6 % (ref 37.5–51.0)
Hemoglobin: 13.3 g/dL (ref 13.0–17.7)
MCH: 32 pg (ref 26.6–33.0)
MCHC: 33.6 g/dL (ref 31.5–35.7)
MCV: 95 fL (ref 79–97)
Platelets: 142 10*3/uL — ABNORMAL LOW (ref 150–450)
RBC: 4.15 x10E6/uL (ref 4.14–5.80)
RDW: 12.6 % (ref 11.6–15.4)
WBC: 7.5 10*3/uL (ref 3.4–10.8)

## 2022-05-06 NOTE — Telephone Encounter (Signed)
Wife was returning call. Please advise ?

## 2022-05-06 NOTE — Telephone Encounter (Signed)
Spoke with patient's wife and she is aware of lab results. Verbalized understanding. She will give office a call if she needs help with setting up monitor.

## 2022-05-09 DIAGNOSIS — I4892 Unspecified atrial flutter: Secondary | ICD-10-CM

## 2022-05-09 DIAGNOSIS — I4891 Unspecified atrial fibrillation: Secondary | ICD-10-CM

## 2022-05-29 DIAGNOSIS — I4891 Unspecified atrial fibrillation: Secondary | ICD-10-CM | POA: Diagnosis not present

## 2022-05-29 DIAGNOSIS — I2510 Atherosclerotic heart disease of native coronary artery without angina pectoris: Secondary | ICD-10-CM | POA: Diagnosis not present

## 2022-06-11 ENCOUNTER — Telehealth: Payer: Self-pay | Admitting: Physician Assistant

## 2022-06-11 NOTE — Telephone Encounter (Signed)
Reviewed monitor results with the patient's wife (DPR).  Voices understanding.  Confirmed plan for appointment 07/23/22.

## 2022-06-11 NOTE — Telephone Encounter (Signed)
Pt spouse returning call for heart monitor results

## 2022-06-12 ENCOUNTER — Other Ambulatory Visit: Payer: Self-pay | Admitting: Neurology

## 2022-06-12 DIAGNOSIS — G20A1 Parkinson's disease without dyskinesia, without mention of fluctuations: Secondary | ICD-10-CM

## 2022-06-17 DIAGNOSIS — C44329 Squamous cell carcinoma of skin of other parts of face: Secondary | ICD-10-CM | POA: Diagnosis not present

## 2022-06-22 DIAGNOSIS — M25811 Other specified joint disorders, right shoulder: Secondary | ICD-10-CM | POA: Diagnosis not present

## 2022-06-25 ENCOUNTER — Ambulatory Visit: Payer: Medicare Other | Admitting: Podiatry

## 2022-06-26 ENCOUNTER — Ambulatory Visit (INDEPENDENT_AMBULATORY_CARE_PROVIDER_SITE_OTHER): Payer: Medicare Other | Admitting: Podiatry

## 2022-06-26 VITALS — BP 130/68

## 2022-06-26 DIAGNOSIS — M79676 Pain in unspecified toe(s): Secondary | ICD-10-CM

## 2022-06-26 DIAGNOSIS — G629 Polyneuropathy, unspecified: Secondary | ICD-10-CM | POA: Diagnosis not present

## 2022-06-26 DIAGNOSIS — B351 Tinea unguium: Secondary | ICD-10-CM

## 2022-06-26 DIAGNOSIS — E119 Type 2 diabetes mellitus without complications: Secondary | ICD-10-CM | POA: Diagnosis not present

## 2022-06-26 NOTE — Progress Notes (Signed)
This patient returns to my office for at risk foot care.  This patient requires this care by a professional since this patient will be at risk due to having  Diabetic neuropathy and Parkinsons.  This patient is unable to cut nails himself since the patient cannot reach his nails.These nails are painful walking and wearing shoes.  This patient presents for at risk foot care today.  General Appearance  Alert, conversant and in no acute stress.  Vascular  Dorsalis pedis and posterior tibial  pulses are palpable  bilaterally.  Capillary return is within normal limits  bilaterally. Temperature is within normal limits  bilaterally.  Neurologic  Senn-Weinstein monofilament wire diminished  bilaterally. Muscle power within normal limits bilaterally.  Nails Thick disfigured discolored nails with subungual debris  from hallux to fifth toes bilaterally. No evidence of bacterial infection or drainage bilaterally.  Orthopedic  No limitations of motion  feet .  No crepitus or effusions noted.  No bony pathology or digital deformities noted.  Skin  normotropic skin with no porokeratosis noted bilaterally.  No signs of infections or ulcers noted.     Onychomycosis  Pain in right toes  Pain in left toes  Consent was obtained for treatment procedures.   Mechanical debridement of nails 1-5  bilaterally performed with a nail nipper.  Filed with dremel without incident.    Return office visit   3 months                   Told patient to return for periodic foot care and evaluation due to potential at risk complications.   Naomia Lenderman DPM  

## 2022-06-29 DIAGNOSIS — I10 Essential (primary) hypertension: Secondary | ICD-10-CM | POA: Diagnosis not present

## 2022-06-29 DIAGNOSIS — N401 Enlarged prostate with lower urinary tract symptoms: Secondary | ICD-10-CM | POA: Diagnosis not present

## 2022-06-29 DIAGNOSIS — Z0000 Encounter for general adult medical examination without abnormal findings: Secondary | ICD-10-CM | POA: Diagnosis not present

## 2022-06-29 DIAGNOSIS — E7800 Pure hypercholesterolemia, unspecified: Secondary | ICD-10-CM | POA: Diagnosis not present

## 2022-06-29 DIAGNOSIS — E559 Vitamin D deficiency, unspecified: Secondary | ICD-10-CM | POA: Diagnosis not present

## 2022-06-29 DIAGNOSIS — N138 Other obstructive and reflux uropathy: Secondary | ICD-10-CM | POA: Diagnosis not present

## 2022-06-29 DIAGNOSIS — Z1331 Encounter for screening for depression: Secondary | ICD-10-CM | POA: Diagnosis not present

## 2022-06-29 DIAGNOSIS — E1142 Type 2 diabetes mellitus with diabetic polyneuropathy: Secondary | ICD-10-CM | POA: Diagnosis not present

## 2022-06-29 DIAGNOSIS — G20A1 Parkinson's disease without dyskinesia, without mention of fluctuations: Secondary | ICD-10-CM | POA: Diagnosis not present

## 2022-06-29 DIAGNOSIS — N400 Enlarged prostate without lower urinary tract symptoms: Secondary | ICD-10-CM | POA: Diagnosis not present

## 2022-06-29 DIAGNOSIS — N1832 Chronic kidney disease, stage 3b: Secondary | ICD-10-CM | POA: Diagnosis not present

## 2022-06-29 DIAGNOSIS — Z23 Encounter for immunization: Secondary | ICD-10-CM | POA: Diagnosis not present

## 2022-06-29 DIAGNOSIS — Z79899 Other long term (current) drug therapy: Secondary | ICD-10-CM | POA: Diagnosis not present

## 2022-06-29 DIAGNOSIS — I480 Paroxysmal atrial fibrillation: Secondary | ICD-10-CM | POA: Diagnosis not present

## 2022-06-29 DIAGNOSIS — E119 Type 2 diabetes mellitus without complications: Secondary | ICD-10-CM | POA: Diagnosis not present

## 2022-06-29 DIAGNOSIS — I25810 Atherosclerosis of coronary artery bypass graft(s) without angina pectoris: Secondary | ICD-10-CM | POA: Diagnosis not present

## 2022-07-01 DIAGNOSIS — C44329 Squamous cell carcinoma of skin of other parts of face: Secondary | ICD-10-CM | POA: Diagnosis not present

## 2022-07-01 DIAGNOSIS — C4492 Squamous cell carcinoma of skin, unspecified: Secondary | ICD-10-CM | POA: Diagnosis not present

## 2022-07-05 ENCOUNTER — Emergency Department (HOSPITAL_COMMUNITY): Payer: Medicare Other

## 2022-07-05 ENCOUNTER — Emergency Department (HOSPITAL_COMMUNITY)
Admission: EM | Admit: 2022-07-05 | Discharge: 2022-07-05 | Disposition: A | Discharge status: Home / Self Care | Payer: Medicare Other | Attending: Emergency Medicine | Admitting: Emergency Medicine

## 2022-07-05 ENCOUNTER — Other Ambulatory Visit: Payer: Self-pay

## 2022-07-05 ENCOUNTER — Encounter (HOSPITAL_COMMUNITY): Payer: Self-pay

## 2022-07-05 DIAGNOSIS — E119 Type 2 diabetes mellitus without complications: Secondary | ICD-10-CM | POA: Insufficient documentation

## 2022-07-05 DIAGNOSIS — I10 Essential (primary) hypertension: Secondary | ICD-10-CM | POA: Diagnosis not present

## 2022-07-05 DIAGNOSIS — Z951 Presence of aortocoronary bypass graft: Secondary | ICD-10-CM | POA: Diagnosis not present

## 2022-07-05 DIAGNOSIS — I517 Cardiomegaly: Secondary | ICD-10-CM | POA: Diagnosis not present

## 2022-07-05 DIAGNOSIS — I2510 Atherosclerotic heart disease of native coronary artery without angina pectoris: Secondary | ICD-10-CM | POA: Diagnosis not present

## 2022-07-05 DIAGNOSIS — Z79899 Other long term (current) drug therapy: Secondary | ICD-10-CM | POA: Insufficient documentation

## 2022-07-05 DIAGNOSIS — I700 Atherosclerosis of aorta: Secondary | ICD-10-CM | POA: Diagnosis not present

## 2022-07-05 DIAGNOSIS — R109 Unspecified abdominal pain: Secondary | ICD-10-CM | POA: Diagnosis not present

## 2022-07-05 DIAGNOSIS — Z7982 Long term (current) use of aspirin: Secondary | ICD-10-CM | POA: Diagnosis not present

## 2022-07-05 DIAGNOSIS — R079 Chest pain, unspecified: Secondary | ICD-10-CM | POA: Diagnosis not present

## 2022-07-05 DIAGNOSIS — R0789 Other chest pain: Secondary | ICD-10-CM | POA: Diagnosis not present

## 2022-07-05 DIAGNOSIS — I4510 Unspecified right bundle-branch block: Secondary | ICD-10-CM | POA: Diagnosis not present

## 2022-07-05 LAB — BASIC METABOLIC PANEL
Anion gap: 9 (ref 5–15)
BUN: 25 mg/dL — ABNORMAL HIGH (ref 8–23)
CO2: 22 mmol/L (ref 22–32)
Calcium: 8.9 mg/dL (ref 8.9–10.3)
Chloride: 101 mmol/L (ref 98–111)
Creatinine, Ser: 1.22 mg/dL (ref 0.61–1.24)
GFR, Estimated: 56 mL/min — ABNORMAL LOW (ref >60)
Glucose, Bld: 118 mg/dL — ABNORMAL HIGH (ref 70–99)
Potassium: 3.9 mmol/L (ref 3.5–5.1)
Sodium: 132 mmol/L — ABNORMAL LOW (ref 135–145)

## 2022-07-05 LAB — CBC
HCT: 38 % — ABNORMAL LOW (ref 39.0–52.0)
Hemoglobin: 12.9 g/dL — ABNORMAL LOW (ref 13.0–17.0)
MCH: 32.3 pg (ref 26.0–34.0)
MCHC: 33.9 g/dL (ref 30.0–36.0)
MCV: 95 fL (ref 80.0–100.0)
Platelets: 138 10*3/uL — ABNORMAL LOW (ref 150–400)
RBC: 4 MIL/uL — ABNORMAL LOW (ref 4.22–5.81)
RDW: 12.7 % (ref 11.5–15.5)
WBC: 8.2 10*3/uL (ref 4.0–10.5)
nRBC: 0 % (ref 0.0–0.2)

## 2022-07-05 LAB — TROPONIN I (HIGH SENSITIVITY)
Troponin I (High Sensitivity): 15 ng/L (ref <18)
Troponin I (High Sensitivity): 16 ng/L (ref <18)

## 2022-07-05 NOTE — ED Triage Notes (Signed)
Chest pain x 1hr total, pain has subsuisded since medic arrival. Pain in centrally locate din the chest, took 2 nitros PTA, reduced the pain in the chest to a 5/10 but now a 0/10. Hx of a CABG.   Medic Vitals   166/88 58 98% RA 20  18g Left AC   Medis given   324mg  asa

## 2022-07-05 NOTE — Discharge Instructions (Addendum)
Your blood work today was reassuring there was no evidence of heart attack however I cannot tell you that you do not have any blockage in your heart.  You will need to follow-up with cardiology and they will most likely need to do some more testing.  If you have any recurrence of pain before seeing them or start having any shortness of breath, passing out or any other concerns return to the emergency room immediately.  Continue all your current medications at home at this time.

## 2022-07-05 NOTE — ED Provider Notes (Signed)
[place] EMERGENCY DEPARTMENT AT [facility] Provider Note   CSN: [id number] Arrival date & time: 07/05/22  4098     History  Chief Complaint  Patient presents with   Chest Pain    Johnny Fox is a 89 y.o. male.  Patient is a 89 year old male with a history of hypertension, diabetes, CAD status post CABG 20 years ago that has not required any further intervention who is presenting today with his wife with complaint of chest pain.  Patient reports the pain started today about 6:00 PM when he was sitting putting together a puzzle when he developed a ache across his entire chest.  He denied any associated symptoms such as nausea, vomiting, near syncope, dizziness, shortness of breath.  After about 5 minutes he got up walked into the living room sat down and took a nitroglycerin.  He waited 5 minutes and took a second nitroglycerin and then called EMS.  By the time they arrived his pain had completely resolved.  He does take an 81 mg aspirin daily as well.  Patient is denying any pain at this time.  He overall has been well prior to this occurring today.  He reports the last time he had an episode of pain was about a year ago but everything turned out to be okay.  He did eat a late lunch today which was pizza and he does not usually eat in the middle of the day but other than that that is the only thing he can think of that was different.  He has not had new cough, fever or congestion.  The history is provided by the patient and the spouse.  Chest Pain      Home Medications Prior to Admission medications   Medication Sig Start Date End Date Taking? Authorizing Provider  acetaminophen (TYLENOL) 325 MG tablet Take 325 mg by mouth every 4 (four) hours as needed for mild pain or headache.    [provider]  aspirin EC 81 MG tablet Take 1 tablet (81 mg total) by mouth daily. 01/14/15   Corky Crafts, MD  Carbidopa-Levodopa ER (SINEMET CR) 25-100 MG tablet  controlled release TAKE 1.5 TABLETS BY MOUTH AT 7 IN THE MORNING. THEN TAKE 1.5 TABLETS BY MOUTH AT 11 IN THE MORNING. THEN TAKE 1.5 TABLETS AT 4 IN THE EVENING 06/12/22   Tat, Octaviano Batty, DO  Cholecalciferol (VITAMIN D3) 50 MCG (2000 UT) capsule     [provider]  Coenzyme Q10 (COQ-10) 100 MG CAPS Take 1 capsule by mouth daily.    [provider]  Cyanocobalamin (B-12) 5000 MCG CAPS Take 1 tablet by mouth 3 (three) times a week.    [provider]  nitroGLYCERIN (NITROSTAT) 0.4 MG SL tablet Place 1 tablet (0.4 mg total) under the tongue every 5 (five) minutes as needed for chest pain. 02/02/21   Linwood Dibbles, MD  Omega-3 Fatty Acids (FISH OIL) 1000 MG CAPS Take 1,000 mg by mouth 2 (two) times daily after a meal.     [provider]  rosuvastatin (CRESTOR) 5 MG tablet Take 5 mg by mouth daily. Take one tablet by mouth Mon, Tue, Thurs, Fri, and Sat.    [provider]  TURMERIC PO Take 1 capsule by mouth 2 (two) times daily.     [provider]      Allergies    Chocolate flavor and Food    Review of Systems   Review of Systems  Cardiovascular:  Positive for chest pain.    Physical Exam Updated Vital Signs BP (!) 186/88   Pulse 64   Temp 99 F (37.2 C) (Oral)   Resp (!) 24   SpO2 98%  Physical Exam Vitals and nursing note reviewed.  Constitutional:      General: He is not in acute distress.    Appearance: He is well-developed.  HENT:     Head: Normocephalic and atraumatic.  Eyes:     Conjunctiva/sclera: Conjunctivae normal.     Pupils: Pupils are equal, round, and reactive to light.  Cardiovascular:     Rate and Rhythm: Normal rate and regular rhythm.     Heart sounds: No murmur heard. Pulmonary:     Effort: Pulmonary effort is normal. No respiratory distress.     Breath sounds: Normal breath sounds. No wheezing or rales.  Abdominal:     General: There is no distension.     Palpations: Abdomen is soft.     Tenderness: There  is no abdominal tenderness. There is no guarding or rebound.  Musculoskeletal:        General: No tenderness. Normal range of motion.     Cervical back: Normal range of motion and neck supple.     Right lower leg: No edema.     Left lower leg: No edema.  Skin:    General: Skin is warm and dry.     Findings: No erythema or rash.  Neurological:     Mental Status: He is alert and oriented to person, place, and time. Mental status is at baseline.  Psychiatric:        Behavior: Behavior normal.     ED Results / Procedures / Treatments   Labs (all labs ordered are listed, but only abnormal results are displayed) Labs Reviewed  BASIC METABOLIC PANEL - Abnormal; Notable for the following components:      Result Value   Sodium 132 (*)    Glucose, Bld 118 (*)    BUN 25 (*)    GFR, Estimated 56 (*)    All other components within normal limits  CBC - Abnormal; Notable for the following components:   RBC 4.00 (*)    Hemoglobin 12.9 (*)    HCT 38.0 (*)    Platelets 138 (*)    All other components within normal limits  TROPONIN I (HIGH SENSITIVITY)  TROPONIN I (HIGH SENSITIVITY)    EKG EKG Interpretation Date/Time:  Sunday July 05 2022 19:31:44 EDT Ventricular Rate:  57 PR Interval:  233 QRS Duration:  156 QT Interval:  433 QTC Calculation: 422 R Axis:   -3  Text Interpretation: Sinus arrhythmia Prolonged PR interval Right bundle branch block Abnormal inferior Q waves No significant change since last tracing Confirmed by Gwyneth Sprout ([id number]) on 07/05/2022 7:56:34 PM  Radiology DG Chest 2 View  Result Date: 07/05/2022 CLINICAL DATA:  Chest pain EXAM: CHEST - 2 VIEW COMPARISON:  02/02/2021 FINDINGS: Cardiac shadow is enlarged but stable. Postsurgical changes are noted. Aortic calcifications are again seen. Lungs are clear. No bony abnormality is noted. IMPRESSION: No active cardiopulmonary disease. Electronically Signed   By: Alcide Clever M.D.   On: 07/05/2022 20:02     Procedures Procedures    Medications Ordered in ED Medications - No data to display  ED Course/ Medical Decision Making/ A&P  Medical Decision Making Amount and/or Complexity of Data Reviewed Independent Historian: spouse External Data Reviewed: notes. Labs: ordered. Decision-making details documented in ED Course. Radiology: ordered and independent interpretation performed. Decision-making details documented in ED Course. ECG/medicine tests: ordered and independent interpretation performed. Decision-making details documented in ED Course.   Pt with multiple medical problems and comorbidities and presenting today with a complaint that caries a high risk for morbidity and mortality.  Here today with complaint of chest pain.  The pain lasted approximately 10 to 15 minutes and resolved after taking 2 nitroglycerin.  He had no associated symptoms but patient does have risk factors and has known heart disease status post CABG 20 years ago.  He has not had any intervention since that time such as catheterization or stenting.  He did have stress test done in 2021 but otherwise has had no recent testing.  Low suspicion for infectious etiology, low suspicion for PE.  Could be GI in nature versus cardiac.  Symptoms started approximately 2 hours ago.  He is symptom-free at this time.  I independently interpreted patient's labs and EKG.  EKG without acute findings today. I have independently visualized and interpreted pt's images today.  Cxr wnl.  I independently interpreted patient's labs and troponin x 2 are normal at 15 and 16, BMP without acute findings, CBC without acute findings.  On repeat evaluation patient's blood pressure is now 160/88.  This is higher than his normal and he will need to keep an eye on this.  Discussed the findings with the patient and his daughter and his wife who are present at bedside.  Gave him the option of admission for chest pain rule out  given his known risk factors versus close follow-up with cardiology.  Patient would like to go home and follow-up with cardiology.  He accepts the risk that we cannot definitively say this was not his heart.  However he has been well-appearing here and stable.  He does understand that if his symptoms return he should return to the emergency room immediately.  His daughter also reports she is can to be staying with them and if there is any change they will bring him back.  Ambulatory referral put in for cardiology he will also call them tomorrow.         Final Clinical Impression(s) / ED Diagnoses Final diagnoses:  Nonspecific chest pain    Rx / DC Orders ED Discharge Orders          Ordered    Ambulatory referral to Cardiology        07/05/22 2222              Gwyneth Sprout, MD 07/05/22 2246

## 2022-07-05 NOTE — ED Notes (Signed)
Pt transported to XRay 

## 2022-07-05 NOTE — ED Notes (Signed)
Pt back from X-Ray.  

## 2022-07-22 NOTE — Progress Notes (Signed)
Cardiology Office Note    Date:  07/23/2022  ID:  AYUUB SCOMA, DOB 1928-10-19, MRN [id number] PCP:  Emilio Aspen, MD  Cardiologist:  Lance Muss, MD  Electrophysiologist:  None   Chief Complaint: f/u monitor  History of Present Illness: .    ARMONIE CRISSINGER is a 89 y.o. male with visit-pertinent history of CAD s/p CABG 2004 with post-op afib with recurrent PAF/PAFL in 2024, PACs, PVCs, brief PSVT, dyslipidemia, RBBB/first degree AVB, moderate MR, mild AI, borderline dilation of ascending aorta, ED, prostate CA, HTN, Parkinson's disease, diet controlled DM, CKD stage 3a and mild thrombocytopenia by last labs, mild carotid artery disease by duplex 2014, exercise limited by neuropathic pain/balance who is seen for follow-up. Per Dr. Hoyle Barr notes, had minimal symptoms prior to CABG, CAD picked up on stress testing with some DOE/minimal angina at that time. Last ischemic eval was by nuc in 01/2021 which small defect in mid anterior location c/w ischemia, low risk study, EF 68%, managed medically. In 2023, amlodipine was added. However, subsequent to this he had issues with low BP in the morning so was given an rx for hydralazine PRN. I saw him 01/2022 to follow up a mechanical fall earlier in December on a day when he'd had poor oral intake and stood up quickly the fell. Wife checked BP and it was in the 80s. Saw PCP and hydralazine was stopped. Wife also clarified he is no longer on Lasix, does not recall when stopped. [facility] labs showed stable Hgb and creatinine. At our office in 01/2022, was in NSR with known RBBB, first degree AVB. Systolic murmur was noted on exam. 2D echo was performed 02/10/22 showing EF 55-60%, mild LVH, G1DD, mildly elevated PASP, moderate MR, mild AI, aortic sclerosis without stenosis, borderline dilation of ascending aorta. At time of f/u 04/2022, he was doing well with EKG showing continued NSR/SA with known first degree AVB, RBBB. However, at that time we  re-requested a copy of the EKG from his PCP office from 12/2021 which had actually demonstrated atrial flutter with CVR. I had discussed with Dr. Eldridge Dace who recommended pursuing event monitor before committing to anticoagulation given fall and bleeding risk. This showed predominantly NSR, 5% PAF/PAFL burden without associated symptoms (51-138bpm without associated symptoms), brief episodes of PSVT, occasional PACs, 3.1% PVCs. Covering APP recommended to continue to defer anticoagulation per prior MD recs and recommended f/u which he is here today to discuss. As of note he had ED OV 06/2022 for CP with negative troponins, in NSR by EKG. In retrospect they wonder if this was related to indigestion from a late lunch.  He is seen back for follow-up and doing well. No further chest pain episodes. He is asymptomatic from a cardiac standpoint without CP, SOB, palpitations. No recurrent falls or syncope. He is participating in [employer] classes regularly. He ambulates with use of a cane or a walker depending on the environment. They feel fortunate to live in a one story dwelling.  Labwork independently reviewed: 06/2022 Hgb 12.9, Plt 138, Na 132, K 3.9, Cr 1.22 12/15/21 [facility] BUN 30, Cr 1.550, Hgb 13.2, Plt 164, K 4.6, TSH wnl 03/2021 [facility] PCP LDL 45, trig 123 01/2021 troponin neg, Hgb 13.1, plt 138, K 4.7, Cr 1.41, glucose 116 2017 Cr 1.67, Hgb 13.8, plt 183/163? (Scan quality)    ROS: Please see the history of present illness.  All other systems are reviewed and otherwise negative.   Studies Reviewed: .  EKG:  EKG is not ordered today but reviewed from recent ED visit in NSR  CV Studies: Cardiac studies reviewed are outlined and summarized above. Otherwise please see EMR for full report.  Physical Exam:    VS:  BP (!) 148/86   Pulse 81   Ht 5\' 10"  (1.778 m)   Wt 167 lb 6.4 oz (75.9 kg)   SpO2 99%   BMI 24.02 kg/m    Wt Readings from Last 3 Encounters:  07/23/22 167 lb 6.4 oz (75.9 kg)   05/05/22 178 lb 3.2 oz (80.8 kg)  01/07/22 173 lb 9.6 oz (78.7 kg)    GEN: Well nourished, well developed in no acute distress NECK: No JVD; No carotid bruits CARDIAC: RRR, soft SEM, no rubs, gallops RESPIRATORY:  Clear to auscultation without rales, wheezing or rhonchi  ABDOMEN: Soft, non-tender, non-distended EXTREMITIES:  No edema; No acute deformity   Asessement and Plan:.    1. Paroxysmal atrial flutter, h/o post-op atrial fib - monitor did confirm recurrent PAF/flutter interspersed with NSR. He is in normal rhythm by exam today. He is fortunately asymptomatic. Given the range of HR response on monitor, relatively low burden, and asymptomatic nature, would hold off on adding AVN blocking agent at this time. I did discuss the findings and anticoagulation with Dr. Ladona Ridgel (DOD) since Dr. Eldridge Dace is out for several days. I also discussed the risks and benefits in detail with the patient and his wife. While he has risk of stroke there is risk of fall. However, he has not fallen in 7 months and has been using assistive devices and participating in boxing classes to maintain mobility. Per d/w Dr. Ladona Ridgel, a trial of Eliquis is reasonable, with the caveat that if he falls one more time, we would have to stop. The patient and his wife are on board with this plan and will notify for any concerns of worsening unsteadiness or falling. Will get f/u CBC and BMET to help guide Eliquis dosing. Plan to stop ASA once we start Eliquis.   2. Baseline RBBB, first degree AVB, also with PACs, PVCs, PSVT - follow clinically as above.  3. HTN - wife has been following diligently at home at the advice of PCP the last two weeks, majority of readings around 130s. I think goal SBP <150 is reasonable given his age and history. Given that she had a log of BPs, did not acutely recheck for metric.  4. CAD, HLD - see above re: aspirin. Otherwise lipids have been followed by PCP. He had recent CP workup that was reassuring.  Symptoms were possibly GI in nature. No recurrence therefore hold off on any further workup at this time.  5. Mild carotid artery disease - given advanced age and lack of carotid bruits, reasonable to follow clinically without repeat testing. Remains on ASA, statin. No TIA/stroke symptoms recently.   6. Moderate MR, mild AI, borderline dilation of ascending aorta, mildly elevated PASP, aortic sclerosis without stenosis - follow clinically for now. No accelerating symptoms. Repeat echo would be due in 02/2023 but may be reasonable to monitor for worsening symptoms clinically instead given comorbidities and advanced age. This can be reviewed in followup.     Disposition: F/u with me in 2 months.  Signed, Laurann Montana, PA-C

## 2022-07-23 ENCOUNTER — Encounter: Payer: Self-pay | Admitting: Physician Assistant

## 2022-07-23 ENCOUNTER — Ambulatory Visit: Payer: Medicare Other | Attending: Physician Assistant | Admitting: Physician Assistant

## 2022-07-23 VITALS — BP 148/86 | HR 81 | Ht 70.0 in | Wt 167.4 lb

## 2022-07-23 DIAGNOSIS — I779 Disorder of arteries and arterioles, unspecified: Secondary | ICD-10-CM | POA: Diagnosis not present

## 2022-07-23 DIAGNOSIS — I351 Nonrheumatic aortic (valve) insufficiency: Secondary | ICD-10-CM | POA: Insufficient documentation

## 2022-07-23 DIAGNOSIS — I4892 Unspecified atrial flutter: Secondary | ICD-10-CM | POA: Insufficient documentation

## 2022-07-23 DIAGNOSIS — I2510 Atherosclerotic heart disease of native coronary artery without angina pectoris: Secondary | ICD-10-CM

## 2022-07-23 DIAGNOSIS — I10 Essential (primary) hypertension: Secondary | ICD-10-CM | POA: Insufficient documentation

## 2022-07-23 DIAGNOSIS — I4510 Unspecified right bundle-branch block: Secondary | ICD-10-CM | POA: Diagnosis not present

## 2022-07-23 DIAGNOSIS — E785 Hyperlipidemia, unspecified: Secondary | ICD-10-CM | POA: Insufficient documentation

## 2022-07-23 DIAGNOSIS — I340 Nonrheumatic mitral (valve) insufficiency: Secondary | ICD-10-CM | POA: Diagnosis not present

## 2022-07-23 DIAGNOSIS — I480 Paroxysmal atrial fibrillation: Secondary | ICD-10-CM | POA: Insufficient documentation

## 2022-07-23 NOTE — Patient Instructions (Signed)
Medication Instructions:  Your physician recommends that you continue on your current medications as directed. Please refer to the Current Medication list given to you today.  *If you need a refill on your cardiac medications before your next appointment, please call your pharmacy*   Lab Work: TODAY:  BMET & CBC  If you have labs (blood work) drawn today and your tests are completely normal, you will receive your results only by: MyChart Message (if you have MyChart) OR A paper copy in the mail If you have any lab test that is abnormal or we need to change your treatment, we will call you to review the results.   Testing/Procedures: None ordered   Follow-Up: At [facility], you and your health needs are our priority.  As part of our continuing mission to provide you with exceptional heart care, we have created designated Provider Care Teams.  These Care Teams include your primary Cardiologist (physician) and Advanced Practice Providers (APPs -  Physician Assistants and Nurse Practitioners) who all work together to provide you with the care you need, when you need it.  We recommend signing up for the patient portal called "MyChart".  Sign up information is provided on this After Visit Summary.  MyChart is used to connect with patients for Virtual Visits (Telemedicine).  Patients are able to view lab/test results, encounter notes, upcoming appointments, etc.  Non-urgent messages can be sent to your provider as well.   To learn more about what you can do with MyChart, go to [email or web address].    Your next appointment:   09/18/22 ARRIVE AT 9:45   Provider:   Ronie Spies, PA-C         Other Instructions

## 2022-07-24 ENCOUNTER — Telehealth: Payer: Self-pay | Admitting: Interventional Cardiology

## 2022-07-24 ENCOUNTER — Other Ambulatory Visit: Payer: Self-pay | Admitting: *Deleted

## 2022-07-24 LAB — BASIC METABOLIC PANEL
BUN/Creatinine Ratio: 15 (ref 10–24)
BUN: 21 mg/dL (ref 10–36)
CO2: 23 mmol/L (ref 20–29)
Calcium: 9.6 mg/dL (ref 8.6–10.2)
Chloride: 99 mmol/L (ref 96–106)
Creatinine, Ser: 1.37 mg/dL — ABNORMAL HIGH (ref 0.76–1.27)
Glucose: 129 mg/dL — ABNORMAL HIGH (ref 70–99)
Potassium: 5 mmol/L (ref 3.5–5.2)
Sodium: 134 mmol/L (ref 134–144)
eGFR: 48 mL/min/{1.73_m2} — ABNORMAL LOW (ref >59)

## 2022-07-24 LAB — CBC
Hematocrit: 40.4 % (ref 37.5–51.0)
Hemoglobin: 13.4 g/dL (ref 13.0–17.7)
MCH: 31.1 pg (ref 26.6–33.0)
MCHC: 33.2 g/dL (ref 31.5–35.7)
MCV: 94 fL (ref 79–97)
Platelets: 162 10*3/uL (ref 150–450)
RBC: 4.31 x10E6/uL (ref 4.14–5.80)
RDW: 12.5 % (ref 11.6–15.4)
WBC: 6 10*3/uL (ref 3.4–10.8)

## 2022-07-24 MED ORDER — APIXABAN 5 MG PO TABS: 5.0000 mg | ORAL_TABLET | Freq: Two times a day (BID) | ORAL | 3 refills | Status: DC

## 2022-07-24 NOTE — Telephone Encounter (Signed)
Per pt's wife Eliquis is pricey at $723.00 for 90 day supply and would like to discuss other possible options at next office visit Pt also has concerns with the risks of falls because of the Parkinson's  Pt's wife is also going to check with pharmacy if can get cheaper as well ./cy Will forward to Ronie Spies PA for review ./cy

## 2022-07-24 NOTE — Telephone Encounter (Signed)
Pt's wife aware for pt to resume Asa 81 mg every day ./cy

## 2022-07-24 NOTE — Telephone Encounter (Signed)
Patient's wife is returning call. 

## 2022-07-24 NOTE — Telephone Encounter (Signed)
If they have decided they do not want to pursue the Eliquis due to fall risk, that is OK by me. As we discussed extensively at the visit yesterday, this is a difficult situation either way and I will lead with what they are most comfortable with. We are delicately balancing the risk of stroke and risk of falls.   If they choose for him not to go on Eliquis, would go back on baby aspirin 81mg  daily. Thank you!

## 2022-07-24 NOTE — Telephone Encounter (Signed)
Lm to call back ./cy 

## 2022-07-24 NOTE — Telephone Encounter (Signed)
Pt c/o medication issue:  1. Name of Medication:   apixaban (ELIQUIS) 5 MG TABS tablet   2. How are you currently taking this medication (dosage and times per day)?   3. Are you having a reaction (difficulty breathing--STAT)?   4. What is your medication issue?   Wife states medication is too expensive for patient and wants to know alternate options.

## 2022-08-03 DIAGNOSIS — L270 Generalized skin eruption due to drugs and medicaments taken internally: Secondary | ICD-10-CM | POA: Diagnosis not present

## 2022-08-20 DIAGNOSIS — C61 Malignant neoplasm of prostate: Secondary | ICD-10-CM | POA: Diagnosis not present

## 2022-08-27 DIAGNOSIS — R3915 Urgency of urination: Secondary | ICD-10-CM | POA: Diagnosis not present

## 2022-08-27 DIAGNOSIS — R351 Nocturia: Secondary | ICD-10-CM | POA: Diagnosis not present

## 2022-08-27 DIAGNOSIS — C61 Malignant neoplasm of prostate: Secondary | ICD-10-CM | POA: Diagnosis not present

## 2022-09-13 ENCOUNTER — Other Ambulatory Visit: Payer: Self-pay | Admitting: Neurology

## 2022-09-13 DIAGNOSIS — G20A1 Parkinson's disease without dyskinesia, without mention of fluctuations: Secondary | ICD-10-CM

## 2022-09-17 ENCOUNTER — Ambulatory Visit: Payer: Medicare Other | Admitting: Physical Therapy

## 2022-09-17 ENCOUNTER — Ambulatory Visit: Payer: Medicare Other | Attending: Neurology | Admitting: Occupational Therapy

## 2022-09-17 ENCOUNTER — Telehealth: Payer: Self-pay | Admitting: Occupational Therapy

## 2022-09-17 DIAGNOSIS — R2689 Other abnormalities of gait and mobility: Secondary | ICD-10-CM

## 2022-09-17 DIAGNOSIS — R2681 Unsteadiness on feet: Secondary | ICD-10-CM | POA: Insufficient documentation

## 2022-09-17 DIAGNOSIS — R278 Other lack of coordination: Secondary | ICD-10-CM | POA: Insufficient documentation

## 2022-09-17 DIAGNOSIS — M6281 Muscle weakness (generalized): Secondary | ICD-10-CM | POA: Insufficient documentation

## 2022-09-17 DIAGNOSIS — Z9181 History of falling: Secondary | ICD-10-CM | POA: Insufficient documentation

## 2022-09-17 DIAGNOSIS — G20A1 Parkinson's disease without dyskinesia, without mention of fluctuations: Secondary | ICD-10-CM

## 2022-09-17 DIAGNOSIS — R29818 Other symptoms and signs involving the nervous system: Secondary | ICD-10-CM | POA: Insufficient documentation

## 2022-09-17 NOTE — Therapy (Signed)
[place] [facility] [address] [place], [place], [address] Phone: [phone]   Fax:  [phone]  Patient Details  Name: Johnny Fox MRN: [id number] Date of Birth: 11/28/1928 Referring Provider:  Marden Noble, MD  Encounter Date: 09/17/2022  Physical Therapy Parkinson's Disease Screen   Timed Up and Go test:18.4 no cane (compared to 13.31 sec)  10 meter walk test:14.28 sec = 2.3 ft/sec (compared to 2.92 ft/sec)  5 time sit to stand test:19.87 sec (compared to 15.09 sec)  Patient would benefit from Physical Therapy evaluation due to slowed mobility measures since previous screen.     Pt reports he feels he is slowing down some, with getting dressed and movements overall.  Not currently doing exercises.  Balance is the biggest problem and had one fall.  Have trouble getting in and out of bed, turning over.   Khamila Bassinger W., PT 09/17/2022, 11:32 AM  [place] [facility] [address] [place], [place], [address] Phone: [phone]   Fax:  [phone]

## 2022-09-17 NOTE — Telephone Encounter (Signed)
Dr. Arbutus Leas, Mr. Baetz was seen for PT, OT screens in our multi-disciplinary clinic.  PT and OT therapy is recommended for follow up due to overall slowing with ADLs and coordination with clothing fasteners and slowed mobility measures compared to previous screen.  If you agree, please send referral via Epic for OT eval and PT eval.  Thank you.  Rosalio Loud, OT

## 2022-09-17 NOTE — Progress Notes (Signed)
Cardiology Office Note:  .   Date:  09/18/2022  ID:  Johnny Fox, DOB Oct 04, 1928, MRN [id number] PCP: Emilio Aspen, MD  [facility] Providers Cardiologist:  Lance Muss, MD    History of Present Illness: .   Johnny Fox is a 89 y.o. male with past medical history of CAD s/p CABG in 2004 with post-op afib with recurrent PAF/PAFL in 2024, PACs, PVCs, brief PSVT, dyslipidemia, RBBB/first degree AVB, moderate MR, mild AI, borderline dilation of ascending aorta, ED, prostate cancer, HTN, parkinsons disease, DM (diet controlled), CKD stage 3a, mild carotid artery disease on duplex in 2014. He is followed by Dr. Eldridge Dace and presents today for follow up.   In 2004 he underwent CABG.  Prior to this he had minimal anginal symptoms, his CAD was picked up on stress testing with some DOE/minimal angina.  His last ischemic evaluation was in 01/2019 with Myoview, this showed a small defect in the mid anterior location consistent with ischemia, but was otherwise normal and felt to be low risk, LVEF was hyperdynamic at >65%, stress EF was 68%. This was managed medically, in 2023 amlodipine was added to his regimen. Following he had problems with low blood pressures in the morning and was started on hydralazine as needed. In 12/2021 he had a mechanical fall on a day with poor intake, he stood up quickly resulting in a fall. His wife checked his BP and it was in the 80's. His PCP stopped hydralazine and at some point his lasix had been stopped per patients wife, but she was unable to recall when.   At office visit in 01/2022 he was in NSR with known RBBB, first degree AVB. He underwent echo in 02/2022 for a systolic murmur, this indicated an LVEF of 55 to 60%, mild LVH, G1 DD, mildly elevated PASP, moderate MR, mild AI, aortic sclerosis without stenosis, borderline dilation of the ascending aorta. On follow up in 04/2022 he had remained stable and was in NSR/SA on EKG with first degree  AVB, RBBB. A copy of his EKG from 12/2021 was requested from PCP, this showed atrial flutter. Dr. Eldridge Dace recommended a event monitor, this showed predominantly NSR, 5% PAF/PAFL burden without associated symptoms (51-128 bpm without associated symptoms), brief episodes of PSVT, occasional PACs, 3.1% PVCs.  Anticoagulation was deferred at that time.  In 06/2022 he had an ED visit for chest pain, troponins were negative and he was in normal sinus rhythm by EKG, it was later questioned if this was related to indigestion from a late lunch.   He was last seen in office on 07/23/2022.  He had remained stable from a cardiac standpoint.  He reported no further falls or syncope.  Given no further falls in 7 months and stroke risk it was felt that a trial of Eliquis was reasonable by Dr. Ladona Ridgel who was DOD that day, noted caveat was that if he fell 1 more time anticoagulation with Eliquis would be stopped.  Patient's wife notified the office the following day that Eliquis would be expensive and they decided to continue just with aspirin 81 mg daily.  Today Johnny Fox presents for follow up with his wife.  He reports that he is doing well, they have no concerns or complaints today.  His wife notes that he did not start on Eliquis as the medication was too expensive, he has been taking aspirin 81 mg daily.  He denies feeling any palpitations.  He denies chest pain or shortness  of breath.  He does report that he had a fall 2 to 3 weeks ago, he had turned around to sit in a chair lost his balance and fell backwards onto carpet.  He notes he was not using his cane or his walker at the time.  He denies having any presyncope or syncope to cause the fall.  He denies any injury from the fall, denies headaches, dizziness or vision changes.  He continues to work with PT for his Parkinson's, notes that his walking has started to slow when he is using his walker more.  He continues with his boxing classes which he enjoys.  ROS:  Today he denies chest pain, shortness of breath, lower extremity edema, fatigue, palpitations, melena, hematuria, hemoptysis, diaphoresis, weakness, presyncope, syncope, orthopnea, and PND.   Studies Reviewed: .       Cardiac Studies & Procedures     STRESS TESTS  MYOCARDIAL PERFUSION IMAGING 02/24/2019  Narrative  The left ventricular ejection fraction is hyperdynamic (>65%).  Nuclear stress EF: 68%.  There was no ST segment deviation noted during stress.  No T wave inversion was noted during stress.  Defect 1: There is a small defect of mild severity present in the mid anterior location.  Findings consistent with ischemia.  This is a low risk study.  Low risk stress nuclear study with a small area of ischemia, possibly due to a diagonal artery stenosis, otherwise normal perfusion. Normal left ventricular regional and global systolic function.   ECHOCARDIOGRAM  ECHOCARDIOGRAM COMPLETE 02/10/2022  Narrative ECHOCARDIOGRAM REPORT    Patient Name:   Johnny Fox Date of Exam: 02/10/2022 Medical Rec #:  [id number]            Height:       70.0 in Accession #:    [id number]           Weight:       173.6 lb Date of Birth:  [birth year]             BSA:          1.965 m Patient Age:    89 years             BP:           140/80 mmHg Patient Gender: M                    HR:           70 bpm. Exam Location:  [address]  Procedure: 2D Echo, Cardiac Doppler and Color Doppler  Indications:    R01.1 Murmur  History:        Patient has no prior history of Echocardiogram examinations. CAD, Arrythmias:Atrial Fibrillation and RBBB, Signs/Symptoms:Murmur; Risk Factors:Hypertension and Dyslipidemia.  Sonographer:    Samule Ohm RDCS Referring Phys: 30 DAYNA N DUNN  IMPRESSIONS   1. Left ventricular ejection fraction, by estimation, is 55 to 60%. The left ventricle has normal function. The left ventricle has no regional wall motion abnormalities. There is mild left  ventricular hypertrophy. Left ventricular diastolic parameters are consistent with Grade I diastolic dysfunction (impaired relaxation). 2. Right ventricular systolic function is normal. The right ventricular size is mildly enlarged. There is mildly elevated pulmonary artery systolic pressure. The estimated right ventricular systolic pressure is 36.6 mmHg. 3. The mitral valve is abnormal. Moderate mitral valve regurgitation. Moderate mitral annular calcification. 4. The aortic valve is tricuspid. Aortic valve regurgitation is mild. Aortic valve sclerosis/calcification is present, without  any evidence of aortic stenosis. 5. Aortic dilatation noted. There is borderline dilatation of the ascending aorta, measuring 38 mm. 6. The inferior vena cava is normal in size with greater than 50% respiratory variability, suggesting right atrial pressure of 3 mmHg.  Comparison(s): No prior Echocardiogram.  FINDINGS Left Ventricle: Left ventricular ejection fraction, by estimation, is 55 to 60%. The left ventricle has normal function. The left ventricle has no regional wall motion abnormalities. The left ventricular internal cavity size was normal in size. There is mild left ventricular hypertrophy. Left ventricular diastolic parameters are consistent with Grade I diastolic dysfunction (impaired relaxation). Indeterminate filling pressures.  Right Ventricle: The right ventricular size is mildly enlarged. No increase in right ventricular wall thickness. Right ventricular systolic function is normal. There is mildly elevated pulmonary artery systolic pressure. The tricuspid regurgitant velocity is 2.90 m/s, and with an assumed right atrial pressure of 3 mmHg, the estimated right ventricular systolic pressure is 36.6 mmHg.  Left Atrium: Left atrial size was normal in size.  Right Atrium: Right atrial size was normal in size.  Pericardium: There is no evidence of pericardial effusion.  Mitral Valve: The mitral  valve is abnormal. There is moderate calcification of the anterior and posterior mitral valve leaflet(s). Moderate mitral annular calcification. Moderate mitral valve regurgitation.  Tricuspid Valve: The tricuspid valve is grossly normal. Tricuspid valve regurgitation is mild.  Aortic Valve: The aortic valve is tricuspid. Aortic valve regurgitation is mild. Aortic valve sclerosis/calcification is present, without any evidence of aortic stenosis.  Pulmonic Valve: The pulmonic valve was not well visualized. Pulmonic valve regurgitation is mild.  Aorta: Aortic dilatation noted. There is borderline dilatation of the ascending aorta, measuring 38 mm.  Venous: The inferior vena cava is normal in size with greater than 50% respiratory variability, suggesting right atrial pressure of 3 mmHg.  IAS/Shunts: The interatrial septum is aneurysmal. No atrial level shunt detected by color flow Doppler.   LEFT VENTRICLE PLAX 2D LVIDd:         4.30 cm   Diastology LVIDs:         2.80 cm   LV e' medial:    6.85 cm/s LV PW:         1.20 cm   LV E/e' medial:  15.5 LV IVS:        1.30 cm   LV e' lateral:   7.83 cm/s LVOT diam:     1.90 cm   LV E/e' lateral: 13.5 LV SV:         71 LV SV Index:   36 LVOT Area:     2.84 cm   RIGHT VENTRICLE             IVC RV S prime:     13.10 cm/s  IVC diam: 1.20 cm TAPSE (M-mode): 1.9 cm RVSP:           36.6 mmHg  LEFT ATRIUM             Index        RIGHT ATRIUM           Index LA diam:        3.90 cm 1.98 cm/m   RA Pressure: 3.00 mmHg LA Vol (A2C):   42.3 ml 21.52 ml/m  RA Area:     17.30 cm LA Vol (A4C):   53.3 ml 27.12 ml/m  RA Volume:   47.80 ml  24.32 ml/m LA Biplane Vol: 50.8 ml 25.85 ml/m AORTIC VALVE LVOT  Vmax:   110.00 cm/s LVOT Vmean:  71.100 cm/s LVOT VTI:    0.250 m  AORTA Ao Root diam: 3.60 cm Ao Asc diam:  3.80 cm  MITRAL VALVE                TRICUSPID VALVE MV Area (PHT): 2.70 cm     TR Peak grad:   33.6 mmHg MV Decel Time: 281 msec      TR Vmax:        290.00 cm/s MV E velocity: 106.00 cm/s  Estimated RAP:  3.00 mmHg MV A velocity: 136.00 cm/s  RVSP:           36.6 mmHg MV E/A ratio:  0.78 SHUNTS Systemic VTI:  0.25 m Systemic Diam: 1.90 cm  Zoila Shutter MD Electronically signed by Zoila Shutter MD Signature Date/Time: 02/10/2022/7:08:57 PM    Final    MONITORS  LONG TERM MONITOR (3-14 DAYS) 05/29/2022  Narrative   Normal sinus rhythm predominantly.  Occasional PACs and PVCs.   Paroxysmal atrial flutter, atrial fibrillation, 5% burden.  No associated symptoms.   Brief episodes of SVT.   Patch Wear Time:  13 days and 22 hours (2024-05-04T11:28:40-0400 to 2024-05-18T10:12:53-0400)  Patient had a min HR of 46 bpm, max HR of 171 bpm, and avg HR of 68 bpm. Predominant underlying rhythm was Sinus Rhythm. Bundle Branch Block/IVCD was present. 2 Ventricular Tachycardia runs occurred, the run with the fastest interval lasting 8 beats with a max rate of 171 bpm (avg 158 bpm); the run with the fastest interval was also the longest. 17 Supraventricular Tachycardia runs occurred, the run with the fastest interval lasting 8 beats with a max rate of 133 bpm, the longest lasting 20.5 secs with an avg rate of 94 bpm. Some episodes of Supraventricular Tachycardia may be possible Atrial Tachycardia with variable block. Atrial Fibrillation/Flutter occurred (5% burden), ranging from 51-138 bpm (avg of 84 bpm), the longest lasting 2 hours 1 min with an avg rate of 75 bpm. Atrial Flutter may be possible Atrial Tachycardia with variable block. Atrial Fibrillation/Flutter was present at de-activation of device. Isolated SVEs were occasional (1.8%, 24306), SVE Couplets were rare (<1.0%, 310), and SVE Triplets were rare (<1.0%, 29). Isolated VEs were occasional (3.1%, 42875), VE Couplets were rare (<1.0%, 250), and VE Triplets were rare (<1.0%, 8). Ventricular Bigeminy and Trigeminy were present.            Risk Assessment/Calculations:          HYPERTENSION CONTROL Vitals:   09/18/22 0959 09/18/22 1025  BP: (!) 152/80 (!) 142/84    The patient's blood pressure is elevated above target today.  In order to address the patient's elevated BP: Blood pressure will be monitored at home to determine if medication changes need to be made. (Given advanced age and co morbidities systolic BP goal is less than 696.)          Physical Exam:   VS:  BP (!) 142/84   Pulse 81   Ht 5\' 10"  (1.778 m)   Wt 164 lb 12.8 oz (74.8 kg)   SpO2 99%   BMI 23.65 kg/m    Wt Readings from Last 3 Encounters:  09/18/22 164 lb 12.8 oz (74.8 kg)  07/23/22 167 lb 6.4 oz (75.9 kg)  05/05/22 178 lb 3.2 oz (80.8 kg)    GEN: Well nourished, well developed in no acute distress NECK: No JVD; No carotid bruits CARDIAC: RRR, +SEM, no rubs, gallops RESPIRATORY:  Clear to auscultation without rales, wheezing or rhonchi  ABDOMEN: Soft, non-tender, non-distended EXTREMITIES:  No edema; No deformity   ASSESSMENT AND PLAN: .    Paroxysmal atrial flutter: History of post op atrial fib, cardiac monitor in 04/2022 indicated 5% PAF/flutter burden without associated symptoms. Given cost of Eliquis and fall risk, patient and wife deferred plan for trial of in favor of aspirin 81 mg daily, following this he also had a repeat fall with no injury. Patient lost his balance while turning to sit in a chair. Given second fall, he is not felt to be a candidate for Eliquis. Today he denies palpitations, although he has not been cardiac aware in the past. Heart rate 81 bpm, regular rate and rhythm on auscultation. Reviewed ED precautions and stroke symptoms. Given low burden and range of HR on monitor will defer starting AVN blocking agents. Continue aspirin 81 mg daily.   Baseline RBBB, first degree AVB, PACs, PVCs, PSVT- follow clinically. Denies feelings of palpitations.   HTN: Blood pressure today 152/80, on recheck was 142/84.  Given patient's age and comorbidities it is  reasonable that his systolic blood pressure goal is less than 150.   CAD: S/p CABG in 2004. Myoview in 01/2019 showed a small defect in the mid anterior location consistent with ischemia, but was otherwise normal and felt to be low risk. Cardiac workup in 06/2022 for CP was reassuring, felt to likely be GI in nature.  He denies any further chest pain, denies shortness of breath.  Reviewed ED precautions. Continue aspirin 81 mg daily.   HLD: Last lipid profile indicated total cholesterol 113, HDL 46, triglycerides 82 and LDL 51.  Monitored and managed by PCP.  On rosuvastatin 5 mg 5 times a week.  Mild carotid disease: Given advanced age and lack of symptoms will follow clinically without repeat testing. No TIA/Stroke symptoms recently. Reviewed stroke symptoms and ED precautions. On aspirin and statin.  Moderate MR, mild AI, borderline dilation of ascending aorta, mildly elevated PASP, aortic sclerosis without stenosis: Follow clinically. Can consider repeat echo in 02/2023, may be reasonable to monitor for clinical symptoms given commodities.        Dispo: Follow up with Dr. Izora Ribas in 6 months or sooner if needed, transitioning given Dr. Hoyle Barr departure.   Signed, Rip Harbour, NP

## 2022-09-17 NOTE — Therapy (Signed)
[place] [facility] [address] [place], [place], [address] Phone: [phone]   Fax:  [phone]  Patient Details  Name: Johnny Fox MRN: [id number] Date of Birth: [birth year] Referring Provider:  Marden Noble, MD  Encounter Date: 09/17/2022  Occupational Therapy Parkinson's Disease Screen  Hand dominance:  right   Physical Performance Test item #2 (simulated eating):  16.25 sec  Physical Performance Test item #4 (donning/doffing jacket):  16.44 sec  Fastening/unfastening 3 buttons in:  53.04 sec (completed on own golf style shirt)  9-hole peg test:    RUE  36.10 sec        LUE  38.55 sec  Change in ability to perform ADLs/IADLs:  Reports slower getting dressed in the mornings.    Other Comments:  Pt reports "I have noticed that I have slowed down." Pt reports improved control of his balance if using the cane.   Reports getting in/out of bed and turning over in bed are all challenging.  Pt would benefit from occupational therapy evaluation due to overall slowing with ADLs and difficulty with clothing fasteners.   Rosalio Loud, OT 09/17/2022, 12:04 PM  [place]  [facility] [address] [place], [place], [address] Phone: [phone]   Fax:  [phone]

## 2022-09-18 ENCOUNTER — Encounter: Payer: Self-pay | Admitting: Cardiology

## 2022-09-18 ENCOUNTER — Ambulatory Visit: Payer: Medicare Other | Attending: Physician Assistant | Admitting: Cardiology

## 2022-09-18 VITALS — BP 142/84 | HR 81 | Ht 70.0 in | Wt 164.8 lb

## 2022-09-18 DIAGNOSIS — I4510 Unspecified right bundle-branch block: Secondary | ICD-10-CM | POA: Insufficient documentation

## 2022-09-18 DIAGNOSIS — I340 Nonrheumatic mitral (valve) insufficiency: Secondary | ICD-10-CM | POA: Diagnosis not present

## 2022-09-18 DIAGNOSIS — I351 Nonrheumatic aortic (valve) insufficiency: Secondary | ICD-10-CM | POA: Diagnosis not present

## 2022-09-18 DIAGNOSIS — I440 Atrioventricular block, first degree: Secondary | ICD-10-CM | POA: Insufficient documentation

## 2022-09-18 DIAGNOSIS — I10 Essential (primary) hypertension: Secondary | ICD-10-CM | POA: Diagnosis present

## 2022-09-18 DIAGNOSIS — I480 Paroxysmal atrial fibrillation: Secondary | ICD-10-CM | POA: Diagnosis present

## 2022-09-18 DIAGNOSIS — I779 Disorder of arteries and arterioles, unspecified: Secondary | ICD-10-CM | POA: Insufficient documentation

## 2022-09-18 DIAGNOSIS — I2510 Atherosclerotic heart disease of native coronary artery without angina pectoris: Secondary | ICD-10-CM | POA: Diagnosis not present

## 2022-09-18 DIAGNOSIS — E785 Hyperlipidemia, unspecified: Secondary | ICD-10-CM | POA: Insufficient documentation

## 2022-09-18 DIAGNOSIS — I4892 Unspecified atrial flutter: Secondary | ICD-10-CM | POA: Insufficient documentation

## 2022-09-18 DIAGNOSIS — I77810 Thoracic aortic ectasia: Secondary | ICD-10-CM | POA: Insufficient documentation

## 2022-09-18 NOTE — Patient Instructions (Signed)
Medication Instructions:  Your physician recommends that you continue on your current medications as directed. Please refer to the Current Medication list given to you today.  *If you need a refill on your cardiac medications before your next appointment, please call your pharmacy*  Lab Work: None ordered today.  Testing/Procedures: None ordered today.  Follow-Up: At [facility], you and your health needs are our priority.  As part of our continuing mission to provide you with exceptional heart care, we have created designated Provider Care Teams.  These Care Teams include your primary Cardiologist (physician) and Advanced Practice Providers (APPs -  Physician Assistants and Nurse Practitioners) who all work together to provide you with the care you need, when you need it.  Your next appointment:   6 month(s)  The format for your next appointment:   In Person  Provider:   Riley Lam, MD

## 2022-09-23 DIAGNOSIS — Z23 Encounter for immunization: Secondary | ICD-10-CM | POA: Diagnosis not present

## 2022-09-23 NOTE — Progress Notes (Signed)
Assessment/Plan:   1.  ET/PD   -DaTscan abnormal with evidence of dopamine loss  -continue carbidopa/levodopa 25/100 CR, 1.5 tablet 3 times per day and add 2 po q hs for turning over in the bed  -We discussed that it used to be thought that levodopa would increase risk of melanoma but now it is believed that Parkinsons itself likely increases risk of melanoma. he is to get regular skin checks.    -starting PT at [facility] next week  -discussed exercise classes and increasing exercise    2.  Gait disorder, multifactorial  -Likely from peripheral neuropathy, cerebellar outflow tracts from essential tremor and potentially even Parkinson's.  -discussed that foot numbness from PN and not from Parkinsons Disease   3.  Urinary incontinence  -discussed myrbetriq and gemtesa.  They can discuss with pcp  4.  Some GAD  -discussed meds but they decided its mostly related to having wife drive since he isn't driving  5.  A-fib/flutter  -May have led to his syncopal episode in the past  -Following with cardiology  -Not on Eliquis, because of cost and because of fall risk  -EEG unremarkable but no events were captured.  6.  Fatigue  -we discussed that fatigue is the number one treatment resistant complaint of Parkinsons Disease patients.  No matter what we do with the Parkinsons Disease medications (including d/c them), patients tend to c/o fatigue, unfortunately.    May have some to do with #5  -discussed his naps and scheduled naps  7.  Memory change  -suspect MCI  -wonder if hearing change contributes as well  -discussed neurocog testing but he decided to hold on that for now  Subjective:   Johnny Fox was seen today in follow up for ET/Parkinsons disease.  This patient is accompanied in the office by his spouse who supplements the history.  Wife with patient and supplements hx. patient was describing a possible syncopal episode last visit.  EEG was done following this and it  was normal.  He did follow-up with cardiology regarding the episode and they had gotten an EKG from primary care that had demonstrated a flutter.  They ultimately decided to do a Zio patch.  His monitor confirmed recurrent PAF/a flutter.  A trial of Eliquis was recommended, but he was told if he fell 1 more time they would stop it.  Ultimately, however, he did not start the Eliquis because of cost.  In addition, he had another fall in the meantime, in which case he tripped over the carpet and fell (didn't get hurt). He was not using any ambulatory assistive devices.  He has been using walker/cane since.  He is not exercising.  He is fatigued.  He is concerned with some weight loss.  He is also concerned about napping in the day - 1 hour in the day and afternoon but not messing with nightime sleep.  Noting also some trouble with turning over in bed at night.  Some trouble with memory but wife notes his is not worse than hers.  He has mostly trouble with names.    Current prescribed movement disorder medications: Carbidopa/levodopa 25/100 CR, 1.5 tablet 3 times per day    PREVIOUS MEDICATIONS: Sinemet (just changed to see if it help fatigue - it didn't)  ALLERGIES:   Allergies  Allergen Reactions   Amoxicillin Rash   Chocolate Flavor Other (See Comments)    sneezing   Food Other (See Comments)    Chocolate-Sneezing  CURRENT MEDICATIONS:  Outpatient Encounter Medications as of 09/25/2022  Medication Sig   acetaminophen (TYLENOL) 325 MG tablet Take 325 mg by mouth every 4 (four) hours as needed for mild pain or headache.   aspirin EC 81 MG tablet Take 81 mg by mouth daily. Swallow whole.   Carbidopa-Levodopa ER (SINEMET CR) 25-100 MG tablet controlled release TAKE 1.5 TABLETS BY MOUTH AT 7 IN THE MORNING. THEN TAKE 1.5 TABLETS BY MOUTH AT 11 IN THE MORNING. THEN TAKE 1.5 TABLETS AT 4 IN THE EVENING   Cholecalciferol (VITAMIN D3) 50 MCG (2000 UT) capsule Take 2,000 Units by mouth every other day.    Coenzyme Q10 (COQ-10) 100 MG CAPS Take 1 capsule by mouth daily.   Cyanocobalamin (B-12) 5000 MCG CAPS Take 1 tablet by mouth 3 (three) times a week.   nitroGLYCERIN (NITROSTAT) 0.4 MG SL tablet Place 1 tablet (0.4 mg total) under the tongue every 5 (five) minutes as needed for chest pain.   Omega-3 Fatty Acids (FISH OIL) 1000 MG CAPS Take 1,000 mg by mouth 2 (two) times daily after a meal.    rosuvastatin (CRESTOR) 5 MG tablet Take 5 mg by mouth daily. Take one tablet by mouth Mon, Tue, Thurs, Fri, and Sat.   TURMERIC PO Take 1 capsule by mouth 2 (two) times daily.    apixaban (ELIQUIS) 5 MG TABS tablet Take 1 tablet (5 mg total) by mouth 2 (two) times daily. (Patient not taking: Reported on 09/18/2022)   No facility-administered encounter medications on file as of 09/25/2022.    Objective:   PHYSICAL EXAMINATION:    VITALS:   Vitals:   09/25/22 0840  BP: 124/86  Pulse: 81  SpO2: 96%  Weight: 164 lb 9.6 oz (74.7 kg)  Height: 5\' 10"  (1.778 m)   Wt Readings from Last 3 Encounters:  09/25/22 164 lb 9.6 oz (74.7 kg)  09/18/22 164 lb 12.8 oz (74.8 kg)  07/23/22 167 lb 6.4 oz (75.9 kg)     GEN:  The patient appears stated age and is in NAD. HEENT:  Normocephalic, atraumatic.  The mucous membranes are moist. The superficial temporal arteries are without ropiness or tenderness. CV:  RRR Lungs:  CTAB Neck/HEME:  There are no carotid bruits bilaterally.  Neurological examination:  Orientation: The patient is alert and oriented x3. Cranial nerves: There is good facial symmetry with facial hypomimia. The speech is fluent and clear. Soft palate rises symmetrically and there is no tongue deviation. Hearing is decreased to conversational tone. Sensation: Sensation is intact to light touch throughout Motor: Strength is at least antigravity x4.  Movement examination: Tone: there is normal tone in the UE/LE Abnormal movements: no rest tremor today Coordination:  There is decremation  only with toe taps on the L Gait and Station: The patient has min difficulty arising out of a deep-seated chair without the use of the hands.  He turns en bloc.  Ambulates with the cane.  Slightly drags L leg    Total time spent on today's visit was 40 minutes, including both face-to-face time and nonface-to-face time.  Time included that spent on review of records (prior notes available to me/labs/imaging if pertinent), discussing treatment and goals, answering patient's questions and coordinating care.  Cc:  Emilio Aspen, MD

## 2022-09-25 ENCOUNTER — Ambulatory Visit (INDEPENDENT_AMBULATORY_CARE_PROVIDER_SITE_OTHER): Payer: Medicare Other | Admitting: Neurology

## 2022-09-25 ENCOUNTER — Encounter: Payer: Self-pay | Admitting: Neurology

## 2022-09-25 VITALS — BP 124/86 | HR 81 | Ht 70.0 in | Wt 164.6 lb

## 2022-09-25 DIAGNOSIS — G20A1 Parkinson's disease without dyskinesia, without mention of fluctuations: Secondary | ICD-10-CM

## 2022-09-25 DIAGNOSIS — R413 Other amnesia: Secondary | ICD-10-CM

## 2022-09-25 MED ORDER — CARBIDOPA-LEVODOPA ER 25-100 MG PO TBCR: EXTENDED_RELEASE_TABLET | ORAL | 2 refills | Status: DC

## 2022-09-28 ENCOUNTER — Encounter: Payer: Self-pay | Admitting: Podiatry

## 2022-09-28 ENCOUNTER — Telehealth: Payer: Self-pay | Admitting: Neurology

## 2022-09-28 ENCOUNTER — Ambulatory Visit (INDEPENDENT_AMBULATORY_CARE_PROVIDER_SITE_OTHER): Payer: Medicare Other | Admitting: Podiatry

## 2022-09-28 DIAGNOSIS — G629 Polyneuropathy, unspecified: Secondary | ICD-10-CM | POA: Diagnosis not present

## 2022-09-28 DIAGNOSIS — M79676 Pain in unspecified toe(s): Secondary | ICD-10-CM | POA: Diagnosis not present

## 2022-09-28 DIAGNOSIS — E119 Type 2 diabetes mellitus without complications: Secondary | ICD-10-CM | POA: Diagnosis not present

## 2022-09-28 DIAGNOSIS — B351 Tinea unguium: Secondary | ICD-10-CM | POA: Diagnosis not present

## 2022-09-28 NOTE — Progress Notes (Signed)
This patient returns to my office for at risk foot care.  This patient requires this care by a professional since this patient will be at risk due to having  Diabetic neuropathy and Parkinsons.  This patient is unable to cut nails himself since the patient cannot reach his nails.These nails are painful walking and wearing shoes.  This patient presents for at risk foot care today.  General Appearance  Alert, conversant and in no acute stress.  Vascular  Dorsalis pedis and posterior tibial  pulses are palpable  bilaterally.  Capillary return is within normal limits  bilaterally. Temperature is within normal limits  bilaterally.  Neurologic  Senn-Weinstein monofilament wire diminished  bilaterally. Muscle power within normal limits bilaterally.  Nails Thick disfigured discolored nails with subungual debris  from hallux to fifth toes bilaterally. No evidence of bacterial infection or drainage bilaterally.  Orthopedic  No limitations of motion  feet .  No crepitus or effusions noted.  No bony pathology or digital deformities noted.  Skin  normotropic skin with no porokeratosis noted bilaterally.  No signs of infections or ulcers noted.     Onychomycosis  Pain in right toes  Pain in left toes  Consent was obtained for treatment procedures.   Mechanical debridement of nails 1-5  bilaterally performed with a nail nipper.  Filed with dremel without incident.    Return office visit   3 months                   Told patient to return for periodic foot care and evaluation due to potential at risk complications.   Helane Gunther DPM

## 2022-09-28 NOTE — Telephone Encounter (Signed)
Called patients wife and went over dosage for this patient and the additional 2 tabs at bedtime

## 2022-09-28 NOTE — Therapy (Signed)
OUTPATIENT PHYSICAL THERAPY NEURO EVALUATION   Patient Name: Johnny Fox MRN: [id number] DOB:06-17-1928, 89 y.o., male Today's Date: 09/30/2022   PCP: Emilio Aspen, MD  REFERRING PROVIDER: Vladimir Faster, DO  END OF SESSION:  PT End of Session - 09/30/22 1024     Visit Number 1    Number of Visits 13    Date for PT Re-Evaluation 11/11/22    Authorization Type Medicare/BCBS    PT Start Time 0847    PT Stop Time 0928    PT Time Calculation (min) 41 min    Equipment Utilized During Treatment Gait belt    Activity Tolerance Patient tolerated treatment well;Patient limited by fatigue    Behavior During Therapy [facility] for tasks assessed/performed             Past Medical History:  Diagnosis Date   Actinic keratoses    and seborrheic keratoses   Arrhythmia    afib postoperatively after CABG in 2004, No recurrence   Coronary artery disease    Diabetes mellitus without complication (HCC)    diet controlled   Dyslipidemia    ED (erectile dysfunction)    Elevated PSA    2010, workup with Dr. Isabel Caprice   H/O prostate biopsy    Hypertension    Mild carotid artery disease (HCC)    Onychomycosis    Prostate cancer (HCC)    PSA's run approx 7- 2011-2013   Right shoulder injury    july 2014, Dr. Caryn Bee Supple   Tick bite of abdomen    2013, treated with 3wks of doxycycline, tick engorged 3.84mm nodule left thyroid 4/14- seen on carotid u/s   Past Surgical History:  Procedure Laterality Date   CARDIAC SURGERY     CATARACT EXTRACTION, BILATERAL     CORONARY ARTERY BYPASS GRAFT  2004   Patient Active Problem List   Diagnosis Date Noted   Aortic aneurysm (HCC) 03/26/2021   Abnormal gait 03/13/2020   Bronchitis 03/13/2020   Chin laceration 03/13/2020   Cough 03/13/2020   Cramp and spasm 03/13/2020   Disorder of penis 03/13/2020   Dizzy spells 03/13/2020   Dyspnea 03/13/2020   Edema of lower extremity 03/13/2020   Elevated PSA 03/13/2020   Encounter for  general adult medical examination without abnormal findings 03/13/2020   Hearing loss in left ear 03/13/2020   History of malignant neoplasm of prostate 03/13/2020   Impacted cerumen 03/13/2020   Low back pain 03/13/2020   Malignant tumor of prostate (HCC) 03/13/2020   Neuropathy 03/13/2020   Orthostatic hypotension 03/13/2020   Other long term (current) drug therapy 03/13/2020   Other specified abnormal findings of blood chemistry 03/13/2020   Other sprain of right hip, initial encounter 03/13/2020   Paresthesia 03/13/2020   Parkinson's disease 03/13/2020   Paroxysmal atrial fibrillation (HCC) 03/13/2020   Polyneuropathy due to type 2 diabetes mellitus (HCC) 03/13/2020   Presence of aortocoronary bypass graft 03/13/2020   Sciatica, right side 03/13/2020   Shoulder joint pain 03/13/2020   Skin sensation disturbance 03/13/2020   Type 2 diabetes mellitus without complications (HCC) 03/13/2020   Upper respiratory infection 03/13/2020   Vitamin D deficiency 03/13/2020   Asymmetrical left sensorineural hearing loss 01/13/2017   Right leg numbness 01/14/2015   Thyroid nodule 01/14/2015   Coronary atherosclerosis of native coronary artery 02/15/2013   Essential hypertension, benign 02/15/2013   Pure hypercholesterolemia 02/15/2013   Fatigue 02/15/2013    ONSET DATE: months  REFERRING DIAG: G20.A1 (ICD-10-CM) -  Parkinson's disease without dyskinesia or fluctuating manifestations  THERAPY DIAG:  Parkinson's disease without dyskinesia or fluctuating manifestations  Unsteadiness on feet  History of falling  Rationale for Evaluation and Treatment: Rehabilitation  SUBJECTIVE:                                                                                                                                                                                             SUBJECTIVE STATEMENT: Patient reports that he is very safety conscious. Had 1 fall without injury while walking 3-4 weeks  ago- not sure if he passed out. Was able to get up on his own. Reports getting lightheaded "sometimes but not often." Continues to go to [employer] and has some concerns about falling there. No concerns about stairs/curbs. Reports mostly using cane and using 4WW "when my wife asks me to." Reports more difficulty swallowing- reports that it takes him longer to drink a glass of water. Pt agreeable to trying ST.    Pt accompanied by: self  PERTINENT HISTORY: CAD, DM, HLD, HTN, prostate CA, CABG 2004  PAIN:  Are you having pain? No  PRECAUTIONS: Fall  RED FLAGS: None   WEIGHT BEARING RESTRICTIONS: No  FALLS: Has patient fallen in last 6 months? Yes. Number of falls 1  LIVING ENVIRONMENT: Lives with: lives with their spouse Lives in: House/apartment Stairs:  no steps to enter; 2 steps to sunroom Has following equipment at home: Environmental consultant - 4 wheeled, shower chair, Grab bars, and cane  PLOF: Independent; retired   PATIENT GOALS: "improve on my ability to move around"  OBJECTIVE:   DIAGNOSTIC FINDINGS: none recent  COGNITION: Overall cognitive status: Within functional limits for tasks assessed   SENSATION: Reports baseline N/T in B feet d/t hx neuropathy   COORDINATION: Alternating pronation/supination: B WNL Alternating toe tap: B WNL Finger to nose: B WNL   MUSCLE TONE: significant rigidity in B LEs    POSTURE: rounded shoulders, forward head, and increased thoracic kyphosis  LOWER EXTREMITY ROM:     Active  Right Eval Left Eval  Hip flexion    Hip extension    Hip abduction    Hip adduction    Hip internal rotation    Hip external rotation    Knee flexion    Knee extension    Ankle dorsiflexion 8 5  Ankle plantarflexion    Ankle inversion    Ankle eversion     (Blank rows = not tested)  LOWER EXTREMITY MMT:    MMT (in sitting) Right Eval Left Eval  Hip flexion 4+ 4+  Hip extension    Hip abduction  4+ 4+  Hip adduction 4 4  Hip internal  rotation    Hip external rotation    Knee flexion 4+ 4+  Knee extension 4+ 4+  Ankle dorsiflexion 4+ 4+  Ankle plantarflexion 4+ 4+  Ankle inversion    Ankle eversion    (Blank rows = not tested)   GAIT: Gait pattern: B short step length; occasional freezing Assistive device utilized: Single point cane Level of assistance: Modified independence and SBA   FUNCTIONAL TESTS:   [facility] PT Assessment - 09/30/22 0001       Standardized Balance Assessment   Five times sit to stand comments  23.41 sec pushing off knees   unable to stand fully on several reps     Mini-BESTest   Sit To Stand Normal: Comes to stand without use of hands and stabilizes independently.    Rise to Toes < 3 s.    Stand on one leg (left) Severe: Unable    Stand on one leg (right) Severe: Unable    Stand on one leg - lowest score 0    Stance - Feet together, eyes open, firm surface  Normal: 30s    Stance - Feet together, eyes closed, foam surface  Severe: Unable    Incline - Eyes Closed Moderate: Stands independently < 30s OR aligns with surface    Change in Gait Speed Moderate: Unable to change walking speed or signs of imbalance    Walk with head turns - Horizontal Moderate: performs head turns with reduction in gait speed.    Walk with pivot turns Moderate:Turns with feet close SLOW (>4 steps) with good balance.    Step over obstacles Normal: Able to step over box with minimal change of gait speed and with good balance.            *patient reported fatigue with testing today and required sit break in between tasks    TODAY'S TREATMENT:                                                                                                                              DATE: 09/30/22    PATIENT EDUCATION: Education details: prognosis, POC, benefits of ST Person educated: Patient Education method: Explanation, Demonstration, Tactile cues, and Verbal cues Education comprehension: verbalized understanding  HOME  EXERCISE PROGRAM: Not initiated    GOALS: Goals reviewed with patient? Yes  SHORT TERM GOALS: Target date: 10/21/2022  Patient to be independent with initial HEP. Baseline: HEP initiated Goal status: INITIAL    LONG TERM GOALS: Target date: 11/11/2022  Patient to be independent with advanced HEP. Baseline: Not yet initiated  Goal status: INITIAL  Patient to improve MiniBestTest score to atleast 17-21 to decrease risk of falls.  Baseline: not yet completed  Goal status: INITIAL  Patient to report 50% improvement in balance confidence during [employer] classes.  Baseline: reports fear of falling  Goal status: INITIAL  Patient to demonstrate 5xSTS  test in <15 sec in order to decrease risk of falls.   Baseline: 23.41 sec pushing off knees   unable to stand fully on several reps Goal status: INITIAL  Patient to verbalize understanding of fall prevention in home environment information. Baseline: Not yet initiated Goal status: INITIAL    ASSESSMENT:  CLINICAL IMPRESSION:  Patient is a 89 y/o M with Parkinson's Disease presenting to [facility] with c/o imbalance. Notes 1 fall without injury in the past 6 months. Patient continues to participate in [employer] but reports fear of falling during this activity. Patient today presenting with significant rigidity in B LEs, limited B ankle dorsiflexion AROM, difficulty with STS transfers, imbalance, gait deviations, and fatigue. Would benefit from skilled PT services 2 x/week for 6 weeks to address aforementioned impairments in order to optimize level of function.    OBJECTIVE IMPAIRMENTS: Abnormal gait, decreased activity tolerance, decreased balance, decreased endurance, decreased ROM, impaired flexibility, and impaired tone.   ACTIVITY LIMITATIONS: carrying, lifting, bending, sitting, standing, squatting, sleeping, stairs, transfers, bed mobility, bathing, toileting, dressing, reach over head, and  hygiene/grooming  PARTICIPATION LIMITATIONS: meal prep, cleaning, laundry, shopping, community activity, and church  PERSONAL FACTORS: Age, Past/current experiences, Time since onset of injury/illness/exacerbation, and 3+ comorbidities: CAD, DM, HLD, HTN, prostate CA, CABG 2004  are also affecting patient's functional outcome.   REHAB POTENTIAL: Good  CLINICAL DECISION MAKING: Evolving/moderate complexity  EVALUATION COMPLEXITY: Moderate  PLAN:  PT FREQUENCY: 2x/week  PT DURATION: 6 weeks  PLANNED INTERVENTIONS: Therapeutic exercises, Therapeutic activity, Neuromuscular re-education, Balance training, Gait training, Patient/Family education, Self Care, Joint mobilization, Stair training, Vestibular training, Canalith repositioning, DME instructions, Aquatic Therapy, Dry Needling, Electrical stimulation, Cryotherapy, Moist heat, Taping, Manual therapy, and Re-evaluation  PLAN FOR NEXT SESSION: 46M walk, complete mini best test; initiate HEP for STS and static and dynamic balance      Anette Guarneri, PT, DPT 09/30/22 11:21 AM  [facility] [address] [place], [place] [address] Phone # [phone] Fax # [phone]

## 2022-09-28 NOTE — Telephone Encounter (Signed)
Patient's wife called stating that they had questions about the medication that was prescribed on Friday. The questions are how many is he suppose to take and if he is suppose to take 1 at night time

## 2022-09-30 ENCOUNTER — Ambulatory Visit: Payer: Medicare Other | Admitting: Physical Therapy

## 2022-09-30 ENCOUNTER — Ambulatory Visit: Payer: Medicare Other | Admitting: Occupational Therapy

## 2022-09-30 ENCOUNTER — Other Ambulatory Visit: Payer: Self-pay

## 2022-09-30 ENCOUNTER — Telehealth: Payer: Self-pay | Admitting: Physical Therapy

## 2022-09-30 ENCOUNTER — Encounter: Payer: Self-pay | Admitting: Physical Therapy

## 2022-09-30 DIAGNOSIS — R2689 Other abnormalities of gait and mobility: Secondary | ICD-10-CM | POA: Diagnosis not present

## 2022-09-30 DIAGNOSIS — G20A1 Parkinson's disease without dyskinesia, without mention of fluctuations: Secondary | ICD-10-CM

## 2022-09-30 DIAGNOSIS — R29818 Other symptoms and signs involving the nervous system: Secondary | ICD-10-CM

## 2022-09-30 DIAGNOSIS — R2681 Unsteadiness on feet: Secondary | ICD-10-CM | POA: Diagnosis not present

## 2022-09-30 DIAGNOSIS — R278 Other lack of coordination: Secondary | ICD-10-CM

## 2022-09-30 DIAGNOSIS — Z9181 History of falling: Secondary | ICD-10-CM

## 2022-09-30 DIAGNOSIS — M6281 Muscle weakness (generalized): Secondary | ICD-10-CM | POA: Diagnosis not present

## 2022-09-30 DIAGNOSIS — R1319 Other dysphagia: Secondary | ICD-10-CM

## 2022-09-30 NOTE — Addendum Note (Signed)
Addended by: Kerin Salen S on: 09/30/2022 11:30 AM   Modules accepted: Orders

## 2022-09-30 NOTE — Therapy (Signed)
OUTPATIENT OCCUPATIONAL THERAPY PARKINSON'S EVALUATION  Patient Name: Johnny Fox MRN: [id number] DOB:[birth year], 89 y.o., male Today's Date: 09/30/2022  PCP: Emilio Aspen, MD REFERRING PROVIDER: Vladimir Faster, DO  END OF SESSION:  OT End of Session - 09/30/22 1013     Visit Number 1    Number of Visits 13    Date for OT Re-Evaluation 11/13/22    Authorization Type Medicare A&B    OT Start Time 0932    OT Stop Time 1014    OT Time Calculation (min) 42 min             Past Medical History:  Diagnosis Date   Actinic keratoses    and seborrheic keratoses   Arrhythmia    afib postoperatively after CABG in 2004, No recurrence   Coronary artery disease    Diabetes mellitus without complication (HCC)    diet controlled   Dyslipidemia    ED (erectile dysfunction)    Elevated PSA    2010, workup with Dr. Isabel Caprice   H/O prostate biopsy    Hypertension    Mild carotid artery disease (HCC)    Onychomycosis    Prostate cancer (HCC)    PSA's run approx 7- 2011-2013   Right shoulder injury    july 2014, Dr. Caryn Bee Supple   Tick bite of abdomen    2013, treated with 3wks of doxycycline, tick engorged 3.40mm nodule left thyroid 4/14- seen on carotid u/s   Past Surgical History:  Procedure Laterality Date   CARDIAC SURGERY     CATARACT EXTRACTION, BILATERAL     CORONARY ARTERY BYPASS GRAFT  2004   Patient Active Problem List   Diagnosis Date Noted   Aortic aneurysm (HCC) 03/26/2021   Abnormal gait 03/13/2020   Bronchitis 03/13/2020   Chin laceration 03/13/2020   Cough 03/13/2020   Cramp and spasm 03/13/2020   Disorder of penis 03/13/2020   Dizzy spells 03/13/2020   Dyspnea 03/13/2020   Edema of lower extremity 03/13/2020   Elevated PSA 03/13/2020   Encounter for general adult medical examination without abnormal findings 03/13/2020   Hearing loss in left ear 03/13/2020   History of malignant neoplasm of prostate 03/13/2020   Impacted cerumen  03/13/2020   Low back pain 03/13/2020   Malignant tumor of prostate (HCC) 03/13/2020   Neuropathy 03/13/2020   Orthostatic hypotension 03/13/2020   Other long term (current) drug therapy 03/13/2020   Other specified abnormal findings of blood chemistry 03/13/2020   Other sprain of right hip, initial encounter 03/13/2020   Paresthesia 03/13/2020   Parkinson's disease 03/13/2020   Paroxysmal atrial fibrillation (HCC) 03/13/2020   Polyneuropathy due to type 2 diabetes mellitus (HCC) 03/13/2020   Presence of aortocoronary bypass graft 03/13/2020   Sciatica, right side 03/13/2020   Shoulder joint pain 03/13/2020   Skin sensation disturbance 03/13/2020   Type 2 diabetes mellitus without complications (HCC) 03/13/2020   Upper respiratory infection 03/13/2020   Vitamin D deficiency 03/13/2020   Asymmetrical left sensorineural hearing loss 01/13/2017   Right leg numbness 01/14/2015   Thyroid nodule 01/14/2015   Coronary atherosclerosis of native coronary artery 02/15/2013   Essential hypertension, benign 02/15/2013   Pure hypercholesterolemia 02/15/2013   Fatigue 02/15/2013    ONSET DATE: referral 09/17/22 (PD diagnosis May 2021)  REFERRING DIAG: G20.A1 (ICD-10-CM) - Parkinson's disease without dyskinesia or fluctuating manifestations  THERAPY DIAG:  Other symptoms and signs involving the nervous system  Muscle weakness (generalized)  Unsteadiness on feet  Other lack of coordination  Other abnormalities of gait and mobility  Rationale for Evaluation and Treatment: Rehabilitation  SUBJECTIVE:   SUBJECTIVE STATEMENT: Pt reports "I have noticed that I have slowed down." Pt reports constant challenge with lack of balance, reports somewhat improved control if using the cane. Reports getting in/out of bed and turning over in bed are all challenging.  Pt reports pain in lower back after prolonged standing, such as when shaving or washing dishes. Pt accompanied by: self and family  member (spouse remained in waiting room)  PERTINENT HISTORY: ET/PD diagnosed approx May 2021; PMH includes repeated falls, CAD, HTN, afib s/p CABG in 2004, hx of prostate cancer, DMT2, and chronic R shoulder pain, hearing loss  PRECAUTIONS: Fall  WEIGHT BEARING RESTRICTIONS: No  PAIN:  Are you having pain? No  FALLS: Has patient fallen in last 6 months? Yes. Number of falls 1, pt reports having one fall (backwards) walking through family room, but does not feel like he tripped   LIVING ENVIRONMENT: Lives with: lives with their spouse Lives in: House/apartment Stairs:  level entry in the back; a few steps to enter the front; 2 steps inside from family room to sun room Has following equipment at home: Single point cane, Environmental consultant - 4 wheeled, Shower bench (built in), bed side commode, and Grab bars  PLOF: Requires assistive device for independence  PATIENT GOALS: improve on my balane  OBJECTIVE:   HAND DOMINANCE: Right  ADLs: Overall ADLs: Increased time Transfers/ambulation related to ADLs: Using [facility] for mobility in home and in community, using RW at home as well Eating: decreased grip strength to open containers, reports some recent difficulty with swallowing Grooming: some difficulty with combing hair due to "bad shoulder" on R from fall many years ago UB Dressing: recently increased difficulty with buttons (does typically wear collared shirts with buttons) LB Dressing: sometimes needs assistance with tying L shoe Toileting: Mod I Bathing: Mod I Tub Shower transfers: Mod I Equipment: Grab bars, Walk in shower, and bed side commode  IADLs: Light housekeeping: washes dishes, help with making the bed every morning Meal Prep: spouse does all the cooking Community mobility: no longer driving Medication management: wife fills pill box and reorders as needed Handwriting: 90% legible, Mild micrographia, and 12.47 sec on PPT #1 (whales live in a [place].)  MOBILITY STATUS: Hx of  falls  POSTURE COMMENTS:   rounded shoulders, forward head, and increased thoracic kyphosis  ACTIVITY TOLERANCE: Activity tolerance: WFL for tasks assessed on eval  FUNCTIONAL OUTCOME MEASURES: Standing functional reach: Right: 5.25 inches; Left: 5 inches Fastening/unfastening 3 buttons: 1:13.28 Physical performance test: PPT#2 (simulated eating) 20.19 sec (noted mild tremor when scooping) & PPT#4 (donning/doffing jacket): 20.95 sec  COORDINATION: Finger Nose Finger test: Mild B tremors, good speed 9 Hole Peg test: Right: 33.78 sec; Left: 38.41 sec Box and Blocks:  Right 43 blocks, Left 40 blocks Tremors: action and Right  UE ROM:   R shoulder limited due to pain, shoulder flexion grossly 130, able to achieve internal and external rotation with reports of mild pain  UE MMT:  R shoulder limited due to pain (grossly 3+/5); LUE WFL (grossly 4+5), B elbows grossly 4+/5   Grip strength: R: 38# and L: 45#  SENSATION: WFL  COGNITION: Overall cognitive status: Within functional limits for tasks assessed; pt and spouse report some trouble with memory but wife notes his is not worse than hers. He has mostly trouble with names.  OBSERVATIONS: Bradykinesia  TODAY'S TREATMENT:                                                                                                                              DATE:  09/30/22 NA, eval only    PATIENT EDUCATION: Education details: Educated on role and purpose of OT as well as potential interventions and goals for therapy based on initial evaluation findings. Person educated: Patient Education method: Explanation Education comprehension: verbalized understanding and needs further education  HOME EXERCISE PROGRAM: TBD  GOALS: Goals reviewed with patient? Yes  SHORT TERM GOALS: Target date: 10/23/22  Pt will be independent with PD specific HEP Baseline: Goal status: INITIAL  2.  Pt will verbalize understanding of strategies to decrease  risk of further complication related to PD, including corresponding energy conservation strategies to improve participation in IADLs  Baseline:  Goal status: INITIAL  3.  Pt will demonstrate increased ease w/ manipulation of clothing fasteners by decreasing time to complete 3 buttons to less than 60 seconds. Baseline: 1:13.28 Goal status: INITIAL   LONG TERM GOALS: Target date: 11/13/22  Pt will improve participation in self-feeding as evidenced by decreasing time to move 5 beans from a bowl w/ a spoon, incorporating compensatory strategies/AE prn  Baseline: 20.19 sec  Goal status: INITIAL  2.  Pt will demonstrate increased ease w/ manipulation of clothing fasteners by decreasing time to complete 3 buttons to less than 50 seconds. Baseline: 1:13.28 Goal status: INITIAL  3.  Pt will demonstrate increased ease with dressing as evidenced by decreasing PPT#4(don/ doff jacket) to 15 secs or less Baseline: 20.95 sec Goal status: INITIAL  4.  Pt will demonstrate improved dynamic standing balance and endurance as needed to complete grooming and/or housekeeping tasks with improved ease. Baseline: reports pain in lower back with prolonged standing. Goal status: INITIAL  ASSESSMENT:  CLINICAL IMPRESSION: Patient is a 89 y.o. male who was seen today for occupational therapy evaluation due to overall slowing with ADLs and difficulty with clothing fasteners. Pt currently lives with spouse in a single level home with 2 steps to enter and 2 steps from family room to sun room inside. Pt will benefit from skilled occupational therapy services to address strength and coordination, ROM, pain management, balance, GM/FM control, safety awareness, introduction of compensatory strategies/AE prn, and implementation of an HEP to improve participation and safety during ADLs, IADLs, and quality of life.    PERFORMANCE DEFICITS: in functional skills including ADLs, IADLs, coordination, ROM, strength, pain,  flexibility, Fine motor control, Gross motor control, balance, body mechanics, endurance, decreased knowledge of precautions, decreased knowledge of use of DME, and UE functional use, cognitive skills including memory, and psychosocial skills including coping strategies and environmental adaptation.   IMPAIRMENTS: are limiting patient from ADLs, IADLs, and rest and sleep.   COMORBIDITIES:  may have co-morbidities  that affects occupational performance. Patient will benefit from skilled OT to address above impairments and improve overall function.  MODIFICATION  OR ASSISTANCE TO COMPLETE EVALUATION: Min-Moderate modification of tasks or assist with assess necessary to complete an evaluation.  OT OCCUPATIONAL PROFILE AND HISTORY: Detailed assessment: Review of records and additional review of physical, cognitive, psychosocial history related to current functional performance.  CLINICAL DECISION MAKING: Moderate - several treatment options, min-mod task modification necessary  REHAB POTENTIAL: Good  EVALUATION COMPLEXITY: Moderate    PLAN:  OT FREQUENCY: 2x/week  OT DURATION: 6 weeks  PLANNED INTERVENTIONS: self care/ADL training, therapeutic exercise, therapeutic activity, neuromuscular re-education, manual therapy, passive range of motion, balance training, functional mobility training, ultrasound, compression bandaging, moist heat, cryotherapy, patient/family education, cognitive remediation/compensation, psychosocial skills training, energy conservation, coping strategies training, and DME and/or AE instructions  RECOMMENDED OTHER SERVICES: NA  CONSULTED AND AGREED WITH PLAN OF CARE: Patient  PLAN FOR NEXT SESSION: initiate coordination HEP, shoulder ROM and stretches, large amplitude (be aware of R shoulder pain)   Everlene Cunning, OTR/L 09/30/2022, 10:15 AM

## 2022-09-30 NOTE — Telephone Encounter (Signed)
Dr. Arbutus Leas,  Mr. Kokal was evaluated in OPPT today for Parkinson's Disease. The patient would benefit from ST evaluation for c/o dysphagia.    If you agree, please place an order in [facility] or fax the order to [phone].  Thank you,  Anette Guarneri, PT, DPT 09/30/22 11:23 AM  [facility] at [facility] [address] [place], [address] [place], [place] [address] Phone # [phone] Fax # [phone]

## 2022-10-05 ENCOUNTER — Ambulatory Visit: Payer: Medicare Other

## 2022-10-05 DIAGNOSIS — M6281 Muscle weakness (generalized): Secondary | ICD-10-CM

## 2022-10-05 DIAGNOSIS — G20A1 Parkinson's disease without dyskinesia, without mention of fluctuations: Secondary | ICD-10-CM | POA: Diagnosis not present

## 2022-10-05 DIAGNOSIS — R2689 Other abnormalities of gait and mobility: Secondary | ICD-10-CM | POA: Diagnosis not present

## 2022-10-05 DIAGNOSIS — R29818 Other symptoms and signs involving the nervous system: Secondary | ICD-10-CM | POA: Diagnosis not present

## 2022-10-05 DIAGNOSIS — Z9181 History of falling: Secondary | ICD-10-CM

## 2022-10-05 DIAGNOSIS — R2681 Unsteadiness on feet: Secondary | ICD-10-CM | POA: Diagnosis not present

## 2022-10-05 DIAGNOSIS — M545 Low back pain, unspecified: Secondary | ICD-10-CM | POA: Diagnosis not present

## 2022-10-05 NOTE — Therapy (Signed)
OUTPATIENT PHYSICAL THERAPY NEURO TREATMENT   Patient Name: Johnny Fox MRN: [id number] DOB:[birth year], 89 y.o., male Today's Date: 10/05/2022   PCP: Emilio Aspen, MD  REFERRING PROVIDER: Vladimir Faster, DO  END OF SESSION:  PT End of Session - 10/05/22 0853     Visit Number 2    Number of Visits 13    Date for PT Re-Evaluation 11/11/22    Authorization Type Medicare/BCBS    PT Start Time 0845    PT Stop Time 0930    PT Time Calculation (min) 45 min    Equipment Utilized During Treatment Gait belt    Activity Tolerance Patient tolerated treatment well;Patient limited by fatigue    Behavior During Therapy WFL for tasks assessed/performed             Past Medical History:  Diagnosis Date   Actinic keratoses    and seborrheic keratoses   Arrhythmia    afib postoperatively after CABG in 2004, No recurrence   Coronary artery disease    Diabetes mellitus without complication (HCC)    diet controlled   Dyslipidemia    ED (erectile dysfunction)    Elevated PSA    2010, workup with Dr. Isabel Caprice   H/O prostate biopsy    Hypertension    Mild carotid artery disease (HCC)    Onychomycosis    Prostate cancer (HCC)    PSA's run approx 7- 2011-2013   Right shoulder injury    july 2014, Dr. Caryn Bee Supple   Tick bite of abdomen    2013, treated with 3wks of doxycycline, tick engorged 3.24mm nodule left thyroid 4/14- seen on carotid u/s   Past Surgical History:  Procedure Laterality Date   CARDIAC SURGERY     CATARACT EXTRACTION, BILATERAL     CORONARY ARTERY BYPASS GRAFT  2004   Patient Active Problem List   Diagnosis Date Noted   Aortic aneurysm (HCC) 03/26/2021   Abnormal gait 03/13/2020   Bronchitis 03/13/2020   Chin laceration 03/13/2020   Cough 03/13/2020   Cramp and spasm 03/13/2020   Disorder of penis 03/13/2020   Dizzy spells 03/13/2020   Dyspnea 03/13/2020   Edema of lower extremity 03/13/2020   Elevated PSA 03/13/2020   Encounter for  general adult medical examination without abnormal findings 03/13/2020   Hearing loss in left ear 03/13/2020   History of malignant neoplasm of prostate 03/13/2020   Impacted cerumen 03/13/2020   Low back pain 03/13/2020   Malignant tumor of prostate (HCC) 03/13/2020   Neuropathy 03/13/2020   Orthostatic hypotension 03/13/2020   Other long term (current) drug therapy 03/13/2020   Other specified abnormal findings of blood chemistry 03/13/2020   Other sprain of right hip, initial encounter 03/13/2020   Paresthesia 03/13/2020   Parkinson's disease 03/13/2020   Paroxysmal atrial fibrillation (HCC) 03/13/2020   Polyneuropathy due to type 2 diabetes mellitus (HCC) 03/13/2020   Presence of aortocoronary bypass graft 03/13/2020   Sciatica, right side 03/13/2020   Shoulder joint pain 03/13/2020   Skin sensation disturbance 03/13/2020   Type 2 diabetes mellitus without complications (HCC) 03/13/2020   Upper respiratory infection 03/13/2020   Vitamin D deficiency 03/13/2020   Asymmetrical left sensorineural hearing loss 01/13/2017   Right leg numbness 01/14/2015   Thyroid nodule 01/14/2015   Coronary atherosclerosis of native coronary artery 02/15/2013   Essential hypertension, benign 02/15/2013   Pure hypercholesterolemia 02/15/2013   Fatigue 02/15/2013    ONSET DATE: months  REFERRING DIAG: G20.A1 (ICD-10-CM) -  Parkinson's disease without dyskinesia or fluctuating manifestations  THERAPY DIAG:  Parkinson's disease without dyskinesia or fluctuating manifestations  Unsteadiness on feet  History of falling  Other symptoms and signs involving the nervous system  Muscle weakness (generalized)  Other abnormalities of gait and mobility  Rationale for Evaluation and Treatment: Rehabilitation  SUBJECTIVE:                                                                                                                                                                                              SUBJECTIVE STATEMENT: Strained low back when assisting wife up from couch. Low back is very painful/sore    Pt accompanied by: self  PERTINENT HISTORY: CAD, DM, HLD, HTN, prostate CA, CABG 2004  PAIN:  Are you having pain? Yes: NPRS scale: about a 9-10/10 Pain location: low back/lumbar Pain description: sharp, ache, sore Aggravating factors: movement Relieving factors: unknown  PRECAUTIONS: Fall  RED FLAGS: None   WEIGHT BEARING RESTRICTIONS: No  FALLS: Has patient fallen in last 6 months? Yes. Number of falls 1  LIVING ENVIRONMENT: Lives with: lives with their spouse Lives in: House/apartment Stairs:  no steps to enter; 2 steps to sunroom Has following equipment at home: Environmental consultant - 4 wheeled, shower chair, Grab bars, and cane  PLOF: Independent; retired   PATIENT GOALS: "improve on my ability to move around"  OBJECTIVE:   TODAY'S TREATMENT: 10/05/22 Activity Comments  Seated with MHP to low back x 5 min   Seated LE AROM 2x10 -AP, hip add iso, LAQ  Physio ball rollouts 2x10 For flexion stretch  Lumbar extensions 2x10 For extension stretch  Mini-BESTest -opted not to complete due to acute LBP  10 meter walk test 20 sec = 1.64 ft/sec  Lumbar trunk rolls 2x10 Cues for deep breathing  SKTC 2x30 sec assisted  DKTC 2x30 sec   Static hamstring stretch 3x60 sec      DIAGNOSTIC FINDINGS: none recent  COGNITION: Overall cognitive status: Within functional limits for tasks assessed   SENSATION: Reports baseline N/T in B feet d/t hx neuropathy   COORDINATION: Alternating pronation/supination: B WNL Alternating toe tap: B WNL Finger to nose: B WNL   MUSCLE TONE: significant rigidity in B LEs    POSTURE: rounded shoulders, forward head, and increased thoracic kyphosis  LOWER EXTREMITY ROM:     Active  Right Eval Left Eval  Hip flexion    Hip extension    Hip abduction    Hip adduction    Hip internal rotation    Hip external rotation    Knee  flexion    Knee extension  Ankle dorsiflexion 8 5  Ankle plantarflexion    Ankle inversion    Ankle eversion     (Blank rows = not tested)  LOWER EXTREMITY MMT:    MMT (in sitting) Right Eval Left Eval  Hip flexion 4+ 4+  Hip extension    Hip abduction 4+ 4+  Hip adduction 4 4  Hip internal rotation    Hip external rotation    Knee flexion 4+ 4+  Knee extension 4+ 4+  Ankle dorsiflexion 4+ 4+  Ankle plantarflexion 4+ 4+  Ankle inversion    Ankle eversion    (Blank rows = not tested)   GAIT: Gait pattern: B short step length; occasional freezing Assistive device utilized: Single point cane Level of assistance: Modified independence and SBA   FUNCTIONAL TESTS:    *patient reported fatigue with testing today and required sit break in between tasks    TODAY'S TREATMENT:                                                                                                                              DATE: 09/30/22    PATIENT EDUCATION: Education details: prognosis, POC, benefits of ST Person educated: Patient Education method: Explanation, Demonstration, Tactile cues, and Verbal cues Education comprehension: verbalized understanding  HOME EXERCISE PROGRAM: Not initiated    GOALS: Goals reviewed with patient? Yes  SHORT TERM GOALS: Target date: 10/21/2022  Patient to be independent with initial HEP. Baseline: HEP initiated Goal status: INITIAL    LONG TERM GOALS: Target date: 11/11/2022  Patient to be independent with advanced HEP. Baseline: Not yet initiated  Goal status: INITIAL  Patient to improve MiniBestTest score to atleast 17-21 to decrease risk of falls.  Baseline: not yet completed  Goal status: INITIAL  Patient to report 50% improvement in balance confidence during [employer] classes.  Baseline: reports fear of falling  Goal status: INITIAL  Patient to demonstrate 5xSTS test in <15 sec in order to decrease risk of falls.   Baseline:  23.41 sec pushing off knees   unable to stand fully on several reps Goal status: INITIAL  Patient to verbalize understanding of fall prevention in home environment information. Baseline: Not yet initiated Goal status: INITIAL    ASSESSMENT:  CLINICAL IMPRESSION: Returns to clinic with acute back pain from straining when lifting spouse from seat.  Reports pain near 10/10 at start of session and focus on gentle lumbar spine mobility followed by static stretching to reduce pain/guarding.  Applied Biofreeze for topical use. Encouraged gentle mobility to reduce pain/guarding and gentle stretching. Opted not to perform remaining items for Mini-BESTest due to nature of activities. Gait speed reveals reduced velocity. Will resume typical plan as pain allows. Notes continued pain after session but reports "moving a little more freely". Continued sessions to progress POC details   OBJECTIVE IMPAIRMENTS: Abnormal gait, decreased activity tolerance, decreased balance, decreased endurance, decreased ROM, impaired flexibility, and impaired tone.   ACTIVITY  LIMITATIONS: carrying, lifting, bending, sitting, standing, squatting, sleeping, stairs, transfers, bed mobility, bathing, toileting, dressing, reach over head, and hygiene/grooming  PARTICIPATION LIMITATIONS: meal prep, cleaning, laundry, shopping, community activity, and church  PERSONAL FACTORS: Age, Past/current experiences, Time since onset of injury/illness/exacerbation, and 3+ comorbidities: CAD, DM, HLD, HTN, prostate CA, CABG 2004  are also affecting patient's functional outcome.   REHAB POTENTIAL: Good  CLINICAL DECISION MAKING: Evolving/moderate complexity  EVALUATION COMPLEXITY: Moderate  PLAN:  PT FREQUENCY: 2x/week  PT DURATION: 6 weeks  PLANNED INTERVENTIONS: Therapeutic exercises, Therapeutic activity, Neuromuscular re-education, Balance training, Gait training, Patient/Family education, Self Care, Joint mobilization, Stair  training, Vestibular training, Canalith repositioning, DME instructions, Aquatic Therapy, Dry Needling, Electrical stimulation, Cryotherapy, Moist heat, Taping, Manual therapy, and Re-evaluation  PLAN FOR NEXT SESSION:  complete mini best test; initiate HEP for STS and static and dynamic balance     9:33 AM, 10/05/22 M. Shary Decamp, PT, DPT Physical Therapist-  Office Number: [phone]

## 2022-10-07 ENCOUNTER — Ambulatory Visit: Payer: Medicare Other | Attending: Neurology | Admitting: Occupational Therapy

## 2022-10-07 ENCOUNTER — Ambulatory Visit: Payer: Medicare Other | Admitting: Physical Therapy

## 2022-10-07 ENCOUNTER — Encounter: Payer: Self-pay | Admitting: Physical Therapy

## 2022-10-07 DIAGNOSIS — R2689 Other abnormalities of gait and mobility: Secondary | ICD-10-CM

## 2022-10-07 DIAGNOSIS — M6281 Muscle weakness (generalized): Secondary | ICD-10-CM | POA: Diagnosis not present

## 2022-10-07 DIAGNOSIS — R41840 Attention and concentration deficit: Secondary | ICD-10-CM | POA: Diagnosis not present

## 2022-10-07 DIAGNOSIS — R278 Other lack of coordination: Secondary | ICD-10-CM | POA: Diagnosis not present

## 2022-10-07 DIAGNOSIS — R1312 Dysphagia, oropharyngeal phase: Secondary | ICD-10-CM | POA: Diagnosis not present

## 2022-10-07 DIAGNOSIS — R2681 Unsteadiness on feet: Secondary | ICD-10-CM

## 2022-10-07 DIAGNOSIS — R29818 Other symptoms and signs involving the nervous system: Secondary | ICD-10-CM | POA: Insufficient documentation

## 2022-10-07 DIAGNOSIS — G20A1 Parkinson's disease without dyskinesia, without mention of fluctuations: Secondary | ICD-10-CM | POA: Diagnosis not present

## 2022-10-07 DIAGNOSIS — Z9181 History of falling: Secondary | ICD-10-CM | POA: Insufficient documentation

## 2022-10-07 NOTE — Patient Instructions (Signed)
Coordination Exercises  Perform the following exercises for 15 minutes 1 times per day. Perform with both hand(s). Perform using big movements.  Flipping Cards: Place deck of cards on the table. Flip cards over by opening your hand big to grasp and then turn your palm up big. Deal cards: Hold 1/2 or whole deck in your hand. Use thumb to push card off top of deck with one big push. Flip card between each finger.  Pick up coins and place in coin bank or container: Pick up with big, intentional movements. Do not drag coin to the edge. Pick up coins and stack one at a time: Pick up with big, intentional movements. Do not drag coin to the edge. (5-10 in a stack) Pick up 5-10 coins one at a time and hold in palm. Then, move coins from palm to fingertips one at time and place in coin bank/container. Pick up coin and rotate in finger tips clockwise and counter clockwise. Pick up various small objects that are different shapes/sizes and place in container. (paperclips, buttons, keys, dried beans/pasta, coins, screws, nuts/bolts, washers, board game pieces, etc.). Don't slide pieces across table top, pick up!  Fasten nuts/bolts or put on bottle caps: Turn as much/as big as you can with each turn. Rotate ball with fingertips: Hold ball on table top with fingers/thumb and move as much as you can with each turn/movement (clockwise and counter-clockwise).  Perform "Flicks"/hand stretches (PWR! Hands): Close hands then flick out your fingers with focus on opening hands, pulling wrists back, and extending elbows like you are pushing.

## 2022-10-07 NOTE — Therapy (Signed)
OUTPATIENT PHYSICAL THERAPY NEURO TREATMENT   Patient Name: Johnny Fox MRN: [id number] DOB:1928-12-28, 89 y.o., male Today's Date: 10/07/2022   PCP: Emilio Aspen, MD  REFERRING PROVIDER: Vladimir Faster, DO  END OF SESSION:  PT End of Session - 10/07/22 0856     Visit Number 3    Number of Visits 13    Date for PT Re-Evaluation 11/11/22    Authorization Type Medicare/BCBS    PT Start Time 0850    PT Stop Time 0930    PT Time Calculation (min) 40 min    Equipment Utilized During Treatment Gait belt    Activity Tolerance Patient tolerated treatment well;Patient limited by pain    Behavior During Therapy WFL for tasks assessed/performed              Past Medical History:  Diagnosis Date   Actinic keratoses    and seborrheic keratoses   Arrhythmia    afib postoperatively after CABG in 2004, No recurrence   Coronary artery disease    Diabetes mellitus without complication (HCC)    diet controlled   Dyslipidemia    ED (erectile dysfunction)    Elevated PSA    2010, workup with Dr. Isabel Caprice   H/O prostate biopsy    Hypertension    Mild carotid artery disease (HCC)    Onychomycosis    Prostate cancer (HCC)    PSA's run approx 7- 2011-2013   Right shoulder injury    july 2014, Dr. Caryn Bee Supple   Tick bite of abdomen    2013, treated with 3wks of doxycycline, tick engorged 3.6mm nodule left thyroid 4/14- seen on carotid u/s   Past Surgical History:  Procedure Laterality Date   CARDIAC SURGERY     CATARACT EXTRACTION, BILATERAL     CORONARY ARTERY BYPASS GRAFT  2004   Patient Active Problem List   Diagnosis Date Noted   Aortic aneurysm (HCC) 03/26/2021   Abnormal gait 03/13/2020   Bronchitis 03/13/2020   Chin laceration 03/13/2020   Cough 03/13/2020   Cramp and spasm 03/13/2020   Disorder of penis 03/13/2020   Dizzy spells 03/13/2020   Dyspnea 03/13/2020   Edema of lower extremity 03/13/2020   Elevated PSA 03/13/2020   Encounter for  general adult medical examination without abnormal findings 03/13/2020   Hearing loss in left ear 03/13/2020   History of malignant neoplasm of prostate 03/13/2020   Impacted cerumen 03/13/2020   Low back pain 03/13/2020   Malignant tumor of prostate (HCC) 03/13/2020   Neuropathy 03/13/2020   Orthostatic hypotension 03/13/2020   Other long term (current) drug therapy 03/13/2020   Other specified abnormal findings of blood chemistry 03/13/2020   Other sprain of right hip, initial encounter 03/13/2020   Paresthesia 03/13/2020   Parkinson's disease (HCC) 03/13/2020   Paroxysmal atrial fibrillation (HCC) 03/13/2020   Polyneuropathy due to type 2 diabetes mellitus (HCC) 03/13/2020   Presence of aortocoronary bypass graft 03/13/2020   Sciatica, right side 03/13/2020   Shoulder joint pain 03/13/2020   Skin sensation disturbance 03/13/2020   Type 2 diabetes mellitus without complications (HCC) 03/13/2020   Upper respiratory infection 03/13/2020   Vitamin D deficiency 03/13/2020   Asymmetrical left sensorineural hearing loss 01/13/2017   Right leg numbness 01/14/2015   Thyroid nodule 01/14/2015   Coronary atherosclerosis of native coronary artery 02/15/2013   Essential hypertension, benign 02/15/2013   Pure hypercholesterolemia 02/15/2013   Fatigue 02/15/2013    ONSET DATE: months  REFERRING DIAG: G20.A1 (  ICD-10-CM) - Parkinson's disease without dyskinesia or fluctuating manifestations  THERAPY DIAG:  Unsteadiness on feet  Muscle weakness (generalized)  Other abnormalities of gait and mobility  Rationale for Evaluation and Treatment: Rehabilitation  SUBJECTIVE:                                                                                                                                                                                             SUBJECTIVE STATEMENT: Saw the doctor and he thinks it's only a strained muscle and it will take time to heal.    Pt accompanied by:  self  PERTINENT HISTORY: CAD, DM, HLD, HTN, prostate CA, CABG 2004  PAIN:  Are you having pain? Yes: NPRS scale: about a 9-10/10 Pain location: low back/lumbar Pain description: sharp, ache, sore Aggravating factors: movement Relieving factors: tylenol and salon pas  PRECAUTIONS: Fall  RED FLAGS: None   WEIGHT BEARING RESTRICTIONS: No  FALLS: Has patient fallen in last 6 months? Yes. Number of falls 1  LIVING ENVIRONMENT: Lives with: lives with their spouse Lives in: House/apartment Stairs:  no steps to enter; 2 steps to sunroom Has following equipment at home: Environmental consultant - 4 wheeled, shower chair, Grab bars, and cane  PLOF: Independent; retired   PATIENT GOALS: "improve on my ability to move around"  OBJECTIVE:   *Did not put moist hot pack on in session today, due to pt wearing Salonpas lidocaine patch  TODAY'S TREATMENT: 10/07/2022 Activity Comments  Seated march LAQ Ankle pumps 2 x 10 No c/o pain  Seated forward lean>upright posture, 2 x 5 No c/o pain  Bed mobility: sit>supine with going to long sit>supine  Some pain  Hooklying position: -Trunk rotation rocking -Trunk rotation stretch, 3 x 30"  -SKTC, 3 x 30" -DTKC 2 x 30" with assist   Attempted bed mobility from supine>sit (pt brings legs off EOM and goes to long sit to up with pain) -attempted roll to R side, then up to sit with log roll, mod assist with great deal of pain   Attempted seated forward flexion stretch with therapy ball, gently x 5 reps Still increased pain from bed mobility      Access Code: [email or web address] URL: [email or web address] Date: 10/07/2022 Prepared by: [facility] - Outpatient  Rehab - [facility]  Exercises - Seated Long Arc Quad  - 1 x daily - 5 x weekly - 2 sets - 10 reps - Seated March  - 1 x daily - 5 x weekly - 2 sets - 10 reps - Seated Ankle Dorsiflexion AROM  - 1 x daily - 5 x weekly - 2 sets - 10  reps - Supine Lower Trunk Rotation  - 1 x daily - 7 x weekly -  1-2 sets - 10 reps - Hooklying Single Knee to Chest  - 1 x daily - 7 x weekly - 1 sets - 3 reps - 15-30 sec hold  PATIENT EDUCATION: Education details: HEP; avoid use of heating pad/hot pack directly over lidocaine patch Person educated: Patient Education method: Explanation, Demonstration, and Handouts Education comprehension: verbalized understanding, returned demonstration, and needs further education    --------------------------------------------------------------------  DIAGNOSTIC FINDINGS: none recent  COGNITION: Overall cognitive status: Within functional limits for tasks assessed   SENSATION: Reports baseline N/T in B feet d/t hx neuropathy   COORDINATION: Alternating pronation/supination: B WNL Alternating toe tap: B WNL Finger to nose: B WNL   MUSCLE TONE: significant rigidity in B LEs    POSTURE: rounded shoulders, forward head, and increased thoracic kyphosis  LOWER EXTREMITY ROM:     Active  Right Eval Left Eval  Hip flexion    Hip extension    Hip abduction    Hip adduction    Hip internal rotation    Hip external rotation    Knee flexion    Knee extension    Ankle dorsiflexion 8 5  Ankle plantarflexion    Ankle inversion    Ankle eversion     (Blank rows = not tested)  LOWER EXTREMITY MMT:    MMT (in sitting) Right Eval Left Eval  Hip flexion 4+ 4+  Hip extension    Hip abduction 4+ 4+  Hip adduction 4 4  Hip internal rotation    Hip external rotation    Knee flexion 4+ 4+  Knee extension 4+ 4+  Ankle dorsiflexion 4+ 4+  Ankle plantarflexion 4+ 4+  Ankle inversion    Ankle eversion    (Blank rows = not tested)   GAIT: Gait pattern: B short step length; occasional freezing Assistive device utilized: Single point cane Level of assistance: Modified independence and SBA   FUNCTIONAL TESTS:    *patient reported fatigue with testing today and required sit break in between tasks    TODAY'S TREATMENT:                                                                                                                               DATE: 09/30/22    PATIENT EDUCATION: Education details: prognosis, POC, benefits of ST Person educated: Patient Education method: Explanation, Demonstration, Tactile cues, and Verbal cues Education comprehension: verbalized understanding  HOME EXERCISE PROGRAM: Not initiated    GOALS: Goals reviewed with patient? Yes  SHORT TERM GOALS: Target date: 10/21/2022  Patient to be independent with initial HEP. Baseline: HEP initiated Goal status: IN PROGRESS    LONG TERM GOALS: Target date: 11/11/2022  Patient to be independent with advanced HEP. Baseline: Not yet initiated  Goal status: IN PROGRESS  Patient to improve MiniBestTest score to atleast 17-21 to decrease risk of falls.  Baseline: not yet completed  Goal status: IN PROGRESS  Patient to report 50% improvement in balance confidence during [employer] classes.  Baseline: reports fear of falling  Goal status: IN PROGRESS  Patient to demonstrate 5xSTS test in <15 sec in order to decrease risk of falls.   Baseline: 23.41 sec pushing off knees   unable to stand fully on several reps Goal status: IN PROGRESS  Patient to verbalize understanding of fall prevention in home environment information. Baseline: Not yet initiated Goal status: IN PROGRESS    ASSESSMENT:  CLINICAL IMPRESSION: Since last session, pt has seen MD, and physician feels that he does have a muscle strain, and advised painfree activities, Tylenol/NSAIDs and rest for pain control.  Worked today in gentle pain-free motion in sitting and in supine for gentle stretch.  Pt does not report pain with exercises, but does have significant pain with bed mobility (both his technique and at attempt at log roll).  He is limited with sitting activities after this, with minimal relief with seated stretch.  Will continue to try to lessen pain with exercises, posture,  positioning and proceed with typical plan towards balance goals as pain allows.  OBJECTIVE IMPAIRMENTS: Abnormal gait, decreased activity tolerance, decreased balance, decreased endurance, decreased ROM, impaired flexibility, and impaired tone.   ACTIVITY LIMITATIONS: carrying, lifting, bending, sitting, standing, squatting, sleeping, stairs, transfers, bed mobility, bathing, toileting, dressing, reach over head, and hygiene/grooming  PARTICIPATION LIMITATIONS: meal prep, cleaning, laundry, shopping, community activity, and church  PERSONAL FACTORS: Age, Past/current experiences, Time since onset of injury/illness/exacerbation, and 3+ comorbidities: CAD, DM, HLD, HTN, prostate CA, CABG 2004  are also affecting patient's functional outcome.   REHAB POTENTIAL: Good  CLINICAL DECISION MAKING: Evolving/moderate complexity  EVALUATION COMPLEXITY: Moderate  PLAN:  PT FREQUENCY: 2x/week  PT DURATION: 6 weeks  PLANNED INTERVENTIONS: Therapeutic exercises, Therapeutic activity, Neuromuscular re-education, Balance training, Gait training, Patient/Family education, Self Care, Joint mobilization, Stair training, Vestibular training, Canalith repositioning, DME instructions, Aquatic Therapy, Dry Needling, Electrical stimulation, Cryotherapy, Moist heat, Taping, Manual therapy, and Re-evaluation  PLAN FOR NEXT SESSION:  complete mini best test (as pain allows); initiate HEP for STS and static and dynamic balance.  Review initial HEP    Lonia Blood, PT 10/07/22 11:28 AM Phone: [phone] Fax: [phone]  [facility] at [facility] [address] [place], [address] [place], [place] [address] Phone # [phone] Fax # [phone]

## 2022-10-07 NOTE — Therapy (Signed)
OUTPATIENT OCCUPATIONAL THERAPY PARKINSON'S Treatment Note  Patient Name: Johnny Fox MRN: [id number] DOB:1928-07-27, 89 y.o., male Today's Date: 10/07/2022  PCP: Emilio Aspen, MD REFERRING PROVIDER: Vladimir Faster, DO  END OF SESSION:  OT End of Session - 10/07/22 0934     Visit Number 2    Number of Visits 13    Date for OT Re-Evaluation 11/13/22    Authorization Type Medicare A&B    OT Start Time 0933    OT Stop Time 1013    OT Time Calculation (min) 40 min             Past Medical History:  Diagnosis Date   Actinic keratoses    and seborrheic keratoses   Arrhythmia    afib postoperatively after CABG in 2004, No recurrence   Coronary artery disease    Diabetes mellitus without complication (HCC)    diet controlled   Dyslipidemia    ED (erectile dysfunction)    Elevated PSA    2010, workup with Dr. Isabel Caprice   H/O prostate biopsy    Hypertension    Mild carotid artery disease (HCC)    Onychomycosis    Prostate cancer (HCC)    PSA's run approx 7- 2011-2013   Right shoulder injury    july 2014, Dr. Caryn Bee Supple   Tick bite of abdomen    2013, treated with 3wks of doxycycline, tick engorged 3.23mm nodule left thyroid 4/14- seen on carotid u/s   Past Surgical History:  Procedure Laterality Date   CARDIAC SURGERY     CATARACT EXTRACTION, BILATERAL     CORONARY ARTERY BYPASS GRAFT  2004   Patient Active Problem List   Diagnosis Date Noted   Aortic aneurysm (HCC) 03/26/2021   Abnormal gait 03/13/2020   Bronchitis 03/13/2020   Chin laceration 03/13/2020   Cough 03/13/2020   Cramp and spasm 03/13/2020   Disorder of penis 03/13/2020   Dizzy spells 03/13/2020   Dyspnea 03/13/2020   Edema of lower extremity 03/13/2020   Elevated PSA 03/13/2020   Encounter for general adult medical examination without abnormal findings 03/13/2020   Hearing loss in left ear 03/13/2020   History of malignant neoplasm of prostate 03/13/2020   Impacted cerumen  03/13/2020   Low back pain 03/13/2020   Malignant tumor of prostate (HCC) 03/13/2020   Neuropathy 03/13/2020   Orthostatic hypotension 03/13/2020   Other long term (current) drug therapy 03/13/2020   Other specified abnormal findings of blood chemistry 03/13/2020   Other sprain of right hip, initial encounter 03/13/2020   Paresthesia 03/13/2020   Parkinson's disease (HCC) 03/13/2020   Paroxysmal atrial fibrillation (HCC) 03/13/2020   Polyneuropathy due to type 2 diabetes mellitus (HCC) 03/13/2020   Presence of aortocoronary bypass graft 03/13/2020   Sciatica, right side 03/13/2020   Shoulder joint pain 03/13/2020   Skin sensation disturbance 03/13/2020   Type 2 diabetes mellitus without complications (HCC) 03/13/2020   Upper respiratory infection 03/13/2020   Vitamin D deficiency 03/13/2020   Asymmetrical left sensorineural hearing loss 01/13/2017   Right leg numbness 01/14/2015   Thyroid nodule 01/14/2015   Coronary atherosclerosis of native coronary artery 02/15/2013   Essential hypertension, benign 02/15/2013   Pure hypercholesterolemia 02/15/2013   Fatigue 02/15/2013    ONSET DATE: referral 09/17/22 (PD diagnosis May 2021)  REFERRING DIAG: G20.A1 (ICD-10-CM) - Parkinson's disease without dyskinesia or fluctuating manifestations  THERAPY DIAG:  Unsteadiness on feet  Other symptoms and signs involving the nervous system  Other lack  of coordination  Muscle weakness (generalized)  Rationale for Evaluation and Treatment: Rehabilitation  SUBJECTIVE:   SUBJECTIVE STATEMENT: Pt reports muscle strain when attempting to assist his wife with getting up from couch.  He saw the doctor and is wearing a medical patch for pain. Pt accompanied by: self and family member (spouse remained in waiting room)  PERTINENT HISTORY: ET/PD diagnosed approx May 2021; PMH includes repeated falls, CAD, HTN, afib s/p CABG in 2004, hx of prostate cancer, DMT2, and chronic R shoulder pain, hearing  loss  PRECAUTIONS: Fall  WEIGHT BEARING RESTRICTIONS: No  PAIN:  Are you having pain? Yes: NPRS scale: 9-10/10 Pain location: back Pain description: sharp, shooting Aggravating factors: positional Relieving factors: position, medical patch  FALLS: Has patient fallen in last 6 months? Yes. Number of falls 1, pt reports having one fall (backwards) walking through family room, but does not feel like he tripped   LIVING ENVIRONMENT: Lives with: lives with their spouse Lives in: House/apartment Stairs:  level entry in the back; a few steps to enter the front; 2 steps inside from family room to sun room Has following equipment at home: Single point cane, Environmental consultant - 4 wheeled, Shower bench (built in), bed side commode, and Grab bars  PLOF: Requires assistive device for independence  PATIENT GOALS: improve on my balane  OBJECTIVE:   HAND DOMINANCE: Right  ADLs: Overall ADLs: Increased time Transfers/ambulation related to ADLs: Using [facility] for mobility in home and in community, using RW at home as well Eating: decreased grip strength to open containers, reports some recent difficulty with swallowing Grooming: some difficulty with combing hair due to "bad shoulder" on R from fall many years ago UB Dressing: recently increased difficulty with buttons (does typically wear collared shirts with buttons) LB Dressing: sometimes needs assistance with tying L shoe Toileting: Mod I Bathing: Mod I Tub Shower transfers: Mod I Equipment: Grab bars, Walk in shower, and bed side commode  IADLs: Light housekeeping: washes dishes, help with making the bed every morning Meal Prep: spouse does all the cooking Community mobility: no longer driving Medication management: wife fills pill box and reorders as needed Handwriting: 90% legible, Mild micrographia, and 12.47 sec on PPT #1 (whales live in [place].)  MOBILITY STATUS: Hx of falls  POSTURE COMMENTS:   rounded shoulders, forward head, and  increased thoracic kyphosis  ACTIVITY TOLERANCE: Activity tolerance: WFL for tasks assessed on eval  FUNCTIONAL OUTCOME MEASURES: Standing functional reach: Right: 5.25 inches; Left: 5 inches Fastening/unfastening 3 buttons: 1:13.28 Physical performance test: PPT#2 (simulated eating) 20.19 sec (noted mild tremor when scooping) & PPT#4 (donning/doffing jacket): 20.95 sec  COORDINATION: Finger Nose Finger test: Mild B tremors, good speed 9 Hole Peg test: Right: 33.78 sec; Left: 38.41 sec Box and Blocks:  Right 43 blocks, Left 40 blocks Tremors: action and Right  UE ROM:   R shoulder limited due to pain, shoulder flexion grossly 130, able to achieve internal and external rotation with reports of mild pain  UE MMT:  R shoulder limited due to pain (grossly 3+/5); LUE WFL (grossly 4+5), B elbows grossly 4+/5   Grip strength: R: 38# and L: 45#  SENSATION: WFL  COGNITION: Overall cognitive status: Within functional limits for tasks assessed; pt and spouse report some trouble with memory but wife notes his is not worse than hers. He has mostly trouble with names.  OBSERVATIONS: Bradykinesia   TODAY'S TREATMENT:  DATE:  10/07/22 Coordination: engaged in card sorting with focus on large amplitude reach and forearm supination.  OT providing verbal cues and demonstration for amplitude. Pt with increased difficulty with RUE due to h/o shoulder injury and pain.  Attempted sliding cards off deck with thumb, however pt with increased difficulty with thumb movements, therefore transitioned to use with coins. Pt demonstrating improved amplitude and coordination with translation of coins from palm to finger tips.  Engaged in rotation of cards, followed by coins clockwise and counter clockwise.  Pt with increased difficulty when rotating counter clockwise.   Picking up and stacking  coins with OT providing cues to pick up and not slide pieces along table top.  Discussed functional carryover of above tasks to incorporate shuffling to deal and play a card game, completing jigsaw puzzles, and even self-care tasks of donning a jacket, shirt, or even a belt. Reviewed large amplitude hands with OT providing cues for amplitude and ROM.  Encouraged use prior to [facility] tasks.   09/30/22 NA, eval only    PATIENT EDUCATION: Education details: ongoing condition specific education Person educated: Patient Education method: Explanation Education comprehension: verbalized understanding and needs further education  HOME EXERCISE PROGRAM: Coordination handout - see pt instructions  GOALS: Goals reviewed with patient? Yes  SHORT TERM GOALS: Target date: 10/23/22  Pt will be independent with PD specific HEP Baseline: Goal status: IN PROGRESS  2.  Pt will verbalize understanding of strategies to decrease risk of further complication related to PD, including corresponding energy conservation strategies to improve participation in IADLs  Baseline:  Goal status: IN PROGRESS  3.  Pt will demonstrate increased ease w/ manipulation of clothing fasteners by decreasing time to complete 3 buttons to less than 60 seconds. Baseline: 1:13.28 Goal status: IN PROGRESS   LONG TERM GOALS: Target date: 11/13/22  Pt will improve participation in self-feeding as evidenced by decreasing time to move 5 beans from a bowl w/ a spoon, incorporating compensatory strategies/AE prn  Baseline: 20.19 sec  Goal status: IN PROGRESS  2.  Pt will demonstrate increased ease w/ manipulation of clothing fasteners by decreasing time to complete 3 buttons to less than 50 seconds. Baseline: 1:13.28 Goal status: IN PROGRESS  3.  Pt will demonstrate increased ease with dressing as evidenced by decreasing PPT#4(don/ doff jacket) to 15 secs or less Baseline: 20.95 sec Goal status: IN PROGRESS  4.  Pt will  demonstrate improved dynamic standing balance and endurance as needed to complete grooming and/or housekeeping tasks with improved ease. Baseline: reports pain in lower back with prolonged standing. Goal status: IN PROGRESS  ASSESSMENT:  CLINICAL IMPRESSION: Pt with increased back pain this session, therefore engaged in all tasks in sitting with back support.  Pt did require slight trunk rotation intermittently with table top tasks, but no c/o increased pain.  OT providing cues for amplitude and carryover of coordination tasks to leisure and functional tasks.  Pt continues to demonstrate difficulty with rotation with screwing/unscrewing nuts and bolts and rotating coins counter clockwise in finger tips.     PERFORMANCE DEFICITS: in functional skills including ADLs, IADLs, coordination, ROM, strength, pain, flexibility, Fine motor control, Gross motor control, balance, body mechanics, endurance, decreased knowledge of precautions, decreased knowledge of use of DME, and UE functional use, cognitive skills including memory, and psychosocial skills including coping strategies and environmental adaptation.   IMPAIRMENTS: are limiting patient from ADLs, IADLs, and rest and sleep.   COMORBIDITIES:  may have co-morbidities  that affects  occupational performance. Patient will benefit from skilled OT to address above impairments and improve overall function.  MODIFICATION OR ASSISTANCE TO COMPLETE EVALUATION: Min-Moderate modification of tasks or assist with assess necessary to complete an evaluation.  OT OCCUPATIONAL PROFILE AND HISTORY: Detailed assessment: Review of records and additional review of physical, cognitive, psychosocial history related to current functional performance.  CLINICAL DECISION MAKING: Moderate - several treatment options, min-mod task modification necessary  REHAB POTENTIAL: Good  EVALUATION COMPLEXITY: Moderate    PLAN:  OT FREQUENCY: 2x/week  OT DURATION: 6  weeks  PLANNED INTERVENTIONS: self care/ADL training, therapeutic exercise, therapeutic activity, neuromuscular re-education, manual therapy, passive range of motion, balance training, functional mobility training, ultrasound, compression bandaging, moist heat, cryotherapy, patient/family education, cognitive remediation/compensation, psychosocial skills training, energy conservation, coping strategies training, and DME and/or AE instructions  RECOMMENDED OTHER SERVICES: NA  CONSULTED AND AGREED WITH PLAN OF CARE: Patient  PLAN FOR NEXT SESSION: Review coordination HEP, initiate shoulder ROM and stretches, large amplitude (be aware of R shoulder pain)   Haani Bakula, OTR/L 10/07/2022, 10:23 AM

## 2022-10-09 ENCOUNTER — Encounter (INDEPENDENT_AMBULATORY_CARE_PROVIDER_SITE_OTHER): Payer: Self-pay | Admitting: Otolaryngology

## 2022-10-09 ENCOUNTER — Ambulatory Visit (INDEPENDENT_AMBULATORY_CARE_PROVIDER_SITE_OTHER): Payer: Medicare Other | Admitting: Audiology

## 2022-10-09 ENCOUNTER — Ambulatory Visit (INDEPENDENT_AMBULATORY_CARE_PROVIDER_SITE_OTHER): Payer: Medicare Other | Admitting: Otolaryngology

## 2022-10-09 VITALS — Wt 164.0 lb

## 2022-10-09 DIAGNOSIS — H903 Sensorineural hearing loss, bilateral: Secondary | ICD-10-CM

## 2022-10-09 DIAGNOSIS — H6123 Impacted cerumen, bilateral: Secondary | ICD-10-CM | POA: Diagnosis not present

## 2022-10-09 NOTE — Progress Notes (Signed)
Patient ID: Johnny Fox, male   DOB: Nov 15, 1928, 89 y.o.   MRN: [id number]  Cc: Progressive hearing loss  HPI: The patient is a 89 year old male who presents today complaining of bilateral progressive hearing loss.  He is having significant hearing difficulty, even with the use of his hearing aids.  The patient was previously seen in April 2022.  At that time, he was noted to have bilateral cerumen impaction and bilateral sensorineural hearing loss.  He was treated with cerumen disimpaction.  According to the patient, his hearing has progressively worsened.  He has significant difficulty in noisy environments.  He currently denies any otalgia, otorrhea, or vertigo.  Exam: General: Communicates without difficulty, well nourished, no acute distress. Head: Normocephalic, no evidence injury, no tenderness, facial buttresses intact without stepoff. Face/sinus: No tenderness to palpation and percussion. Facial movement is normal and symmetric. Eyes: PERRL, EOMI. No scleral icterus, conjunctivae clear. Neuro: CN II exam reveals vision grossly intact.  No nystagmus at any point of gaze. Ears: Auricles well formed without lesions.  EAC: Bilateral cerumen impaction.  Under the operating microscope, the cerumen is carefully removed with a combination of cerumen currette, alligator forceps, and suction catheters.  After the cerumen is removed, the TMs are noted to be normal.   Nose: External evaluation reveals normal support and skin without lesions.  Dorsum is intact.  Anterior rhinoscopy reveals congested mucosa over anterior aspect of inferior turbinates and intact septum.  No purulence noted. Oral:  Oral cavity and oropharynx are intact, symmetric, without erythema or edema.  Mucosa is moist without lesions. Neck: Full range of motion without pain.  There is no significant lymphadenopathy.  No masses palpable.  Thyroid bed within normal limits to palpation.  Parotid glands and submandibular glands equal  bilaterally without mass.  Trachea is midline. Neuro:  CN 2-12 grossly intact.    Procedure: Bilateral cerumen disimpaction Anesthesia: None Description: Under the operating microscope, the cerumen is carefully removed with a combination of cerumen currette, alligator forceps, and suction catheters.  After the cerumen is removed, the TMs are noted to be normal.  No mass, erythema, or lesions. The patient tolerated the procedure well.    Assessment: 1.  Bilateral cerumen impaction. 2.  Bilateral high-frequency sensorineural hearing loss, likely secondary to presbycusis. 3.  His ear canals and tympanic membranes are otherwise normal.  Plan: 1.  Otomicroscopy with bilateral cerumen disimpaction. 2.  The physical exam findings and the hearing test results were reviewed with the patient. 3.  Continue the use of his hearing aids. 4.  The patient will return for reevaluation in 1 year.

## 2022-10-09 NOTE — Progress Notes (Unsigned)
Tympanometry: Right: WNL (Type A) Left: WNL (Type A)   Audiometry: Completed under headphones with good-fair reliability Right: Mild sensorineural hearing loss at 250 Hz gradually sloping to profound sensorineural hearing loss from [phone] Hz with an air bone gap at 4000 Hz; SRT 60 dB HL; WRS 16% at 75 dB HL Left: Mild sensorineural hearing loss at 250 Hz sloping to profound sensorineural hearing loss from [phone] Hz with an air bone gap at 4000 Hz; SRT 70 dB HL masked; WRS 16% at 80 dB HL with 50 dB HL of masking noise Asymmetry noted, left ear worse than the right ear, from [phone]

## 2022-10-12 ENCOUNTER — Ambulatory Visit: Payer: Medicare Other | Admitting: Occupational Therapy

## 2022-10-12 ENCOUNTER — Ambulatory Visit: Payer: Medicare Other

## 2022-10-12 DIAGNOSIS — G20A1 Parkinson's disease without dyskinesia, without mention of fluctuations: Secondary | ICD-10-CM

## 2022-10-12 DIAGNOSIS — R29818 Other symptoms and signs involving the nervous system: Secondary | ICD-10-CM

## 2022-10-12 DIAGNOSIS — R1312 Dysphagia, oropharyngeal phase: Secondary | ICD-10-CM | POA: Diagnosis not present

## 2022-10-12 DIAGNOSIS — R278 Other lack of coordination: Secondary | ICD-10-CM

## 2022-10-12 DIAGNOSIS — R2681 Unsteadiness on feet: Secondary | ICD-10-CM | POA: Diagnosis not present

## 2022-10-12 DIAGNOSIS — M6281 Muscle weakness (generalized): Secondary | ICD-10-CM

## 2022-10-12 DIAGNOSIS — R2689 Other abnormalities of gait and mobility: Secondary | ICD-10-CM | POA: Diagnosis not present

## 2022-10-12 NOTE — Therapy (Signed)
OUTPATIENT PHYSICAL THERAPY NEURO TREATMENT   Patient Name: Johnny Fox MRN: [id number] DOB:April 24, 1928, 89 y.o., male Today's Date: 10/12/2022   PCP: Emilio Aspen, MD  REFERRING PROVIDER: Vladimir Faster, DO  END OF SESSION:  PT End of Session - 10/12/22 0933     Visit Number 4    Number of Visits 13    Date for PT Re-Evaluation 11/11/22    Authorization Type Medicare/BCBS    PT Start Time 0930    PT Stop Time 1015    PT Time Calculation (min) 45 min    Equipment Utilized During Treatment Gait belt    Activity Tolerance Patient tolerated treatment well;Patient limited by pain    Behavior During Therapy WFL for tasks assessed/performed              Past Medical History:  Diagnosis Date   Actinic keratoses    and seborrheic keratoses   Arrhythmia    afib postoperatively after CABG in 2004, No recurrence   Coronary artery disease    Diabetes mellitus without complication (HCC)    diet controlled   Dyslipidemia    ED (erectile dysfunction)    Elevated PSA    2010, workup with Dr. Isabel Caprice   H/O prostate biopsy    Hypertension    Mild carotid artery disease (HCC)    Onychomycosis    Prostate cancer (HCC)    PSA's run approx 7- 2011-2013   Right shoulder injury    july 2014, Dr. Caryn Bee Supple   Tick bite of abdomen    2013, treated with 3wks of doxycycline, tick engorged 3.59mm nodule left thyroid 4/14- seen on carotid u/s   Past Surgical History:  Procedure Laterality Date   CARDIAC SURGERY     CATARACT EXTRACTION, BILATERAL     CORONARY ARTERY BYPASS GRAFT  2004   Patient Active Problem List   Diagnosis Date Noted   Bilateral impacted cerumen 10/09/2022   Sensorineural hearing loss, bilateral 10/09/2022   Aortic aneurysm (HCC) 03/26/2021   Abnormal gait 03/13/2020   Bronchitis 03/13/2020   Chin laceration 03/13/2020   Cough 03/13/2020   Cramp and spasm 03/13/2020   Disorder of penis 03/13/2020   Dizzy spells 03/13/2020   Dyspnea  03/13/2020   Edema of lower extremity 03/13/2020   Elevated PSA 03/13/2020   Encounter for general adult medical examination without abnormal findings 03/13/2020   History of malignant neoplasm of prostate 03/13/2020   Low back pain 03/13/2020   Malignant tumor of prostate (HCC) 03/13/2020   Neuropathy 03/13/2020   Orthostatic hypotension 03/13/2020   Other long term (current) drug therapy 03/13/2020   Other specified abnormal findings of blood chemistry 03/13/2020   Other sprain of right hip, initial encounter 03/13/2020   Paresthesia 03/13/2020   Parkinson's disease (HCC) 03/13/2020   Paroxysmal atrial fibrillation (HCC) 03/13/2020   Polyneuropathy due to type 2 diabetes mellitus (HCC) 03/13/2020   Presence of aortocoronary bypass graft 03/13/2020   Sciatica, right side 03/13/2020   Shoulder joint pain 03/13/2020   Skin sensation disturbance 03/13/2020   Type 2 diabetes mellitus without complications (HCC) 03/13/2020   Upper respiratory infection 03/13/2020   Vitamin D deficiency 03/13/2020   Right leg numbness 01/14/2015   Thyroid nodule 01/14/2015   Coronary atherosclerosis of native coronary artery 02/15/2013   Essential hypertension, benign 02/15/2013   Pure hypercholesterolemia 02/15/2013   Fatigue 02/15/2013    ONSET DATE: months  REFERRING DIAG: G20.A1 (ICD-10-CM) - Parkinson's disease without dyskinesia or fluctuating  manifestations  THERAPY DIAG:  Unsteadiness on feet  Muscle weakness (generalized)  Other abnormalities of gait and mobility  Other symptoms and signs involving the nervous system  Parkinson's disease without dyskinesia or fluctuating manifestations (HCC)  Rationale for Evaluation and Treatment: Rehabilitation  SUBJECTIVE:                                                                                                                                                                                             SUBJECTIVE STATEMENT: The back is  still acting up, very sore and limiting all activity  Pt accompanied by: self  PERTINENT HISTORY: CAD, DM, HLD, HTN, prostate CA, CABG 2004  PAIN:  Are you having pain? Yes: NPRS scale: "near a 10"/10 Pain location: low back/lumbar Pain description: sharp, ache, sore Aggravating factors: movement Relieving factors: tylenol and salon pas  PRECAUTIONS: Fall  RED FLAGS: None   WEIGHT BEARING RESTRICTIONS: No  FALLS: Has patient fallen in last 6 months? Yes. Number of falls 1  LIVING ENVIRONMENT: Lives with: lives with their spouse Lives in: House/apartment Stairs:  no steps to enter; 2 steps to sunroom Has following equipment at home: Environmental consultant - 4 wheeled, shower chair, Grab bars, and cane  PLOF: Independent; retired   PATIENT GOALS: "improve on my ability to move around"  OBJECTIVE:   TODAY'S TREATMENT: 10/12/22 Activity Comments  NU-step w/ MHP applied to lumbar x 9 min -LE only x 6 min, then w/ BUE x 3 min for gentle trunk rotation  Manual therapy -soft tissue mobilization bilat lumbar area with palpable right QL trigger point and pt ID as area of greatest discomfort, manual soft tissue mob, myofascial release followed by mild-moderate Shiatsu  -kinesiotape for inhibition to R QL and erector facilitation -able to verbalize understanding and teach back for K-tape relevant info for mgmt  Modified seated sidebending QL stretch R shoulder pain limits overhead  Notes pain response is lower than starting rates 6/10            *Did not put moist hot pack on in session today, due to pt wearing Salonpas lidocaine patch  TODAY'S TREATMENT: 10/07/2022 Activity Comments  Seated march LAQ Ankle pumps 2 x 10 No c/o pain  Seated forward lean>upright posture, 2 x 5 No c/o pain  Bed mobility: sit>supine with going to long sit>supine  Some pain  Hooklying position: -Trunk rotation rocking -Trunk rotation stretch, 3 x 30"  -SKTC, 3 x 30" -DTKC 2 x 30" with assist   Attempted  bed mobility from supine>sit (pt brings legs off EOM and goes to long sit to up with pain) -  attempted roll to R side, then up to sit with log roll, mod assist with great deal of pain   Attempted seated forward flexion stretch with therapy ball, gently x 5 reps Still increased pain from bed mobility      Access Code: [email or web address] URL: [email or web address] Date: 10/07/2022 Prepared by: [facility] - Outpatient  Rehab - [facility]  Exercises - Seated Long Arc Quad  - 1 x daily - 5 x weekly - 2 sets - 10 reps - Seated March  - 1 x daily - 5 x weekly - 2 sets - 10 reps - Seated Ankle Dorsiflexion AROM  - 1 x daily - 5 x weekly - 2 sets - 10 reps - Supine Lower Trunk Rotation  - 1 x daily - 7 x weekly - 1-2 sets - 10 reps - Hooklying Single Knee to Chest  - 1 x daily - 7 x weekly - 1 sets - 3 reps - 15-30 sec hold  PATIENT EDUCATION: Education details: HEP; avoid use of heating pad/hot pack directly over lidocaine patch Person educated: Patient Education method: Explanation, Demonstration, and Handouts Education comprehension: verbalized understanding, returned demonstration, and needs further education    --------------------------------------------------------------------  DIAGNOSTIC FINDINGS: none recent  COGNITION: Overall cognitive status: Within functional limits for tasks assessed   SENSATION: Reports baseline N/T in B feet d/t hx neuropathy   COORDINATION: Alternating pronation/supination: B WNL Alternating toe tap: B WNL Finger to nose: B WNL   MUSCLE TONE: significant rigidity in B LEs    POSTURE: rounded shoulders, forward head, and increased thoracic kyphosis  LOWER EXTREMITY ROM:     Active  Right Eval Left Eval  Hip flexion    Hip extension    Hip abduction    Hip adduction    Hip internal rotation    Hip external rotation    Knee flexion    Knee extension    Ankle dorsiflexion 8 5  Ankle plantarflexion    Ankle inversion    Ankle  eversion     (Blank rows = not tested)  LOWER EXTREMITY MMT:    MMT (in sitting) Right Eval Left Eval  Hip flexion 4+ 4+  Hip extension    Hip abduction 4+ 4+  Hip adduction 4 4  Hip internal rotation    Hip external rotation    Knee flexion 4+ 4+  Knee extension 4+ 4+  Ankle dorsiflexion 4+ 4+  Ankle plantarflexion 4+ 4+  Ankle inversion    Ankle eversion    (Blank rows = not tested)   GAIT: Gait pattern: B short step length; occasional freezing Assistive device utilized: Single point cane Level of assistance: Modified independence and SBA   FUNCTIONAL TESTS:    *patient reported fatigue with testing today and required sit break in between tasks    TODAY'S TREATMENT:  DATE: 09/30/22    PATIENT EDUCATION: Education details: prognosis, POC, benefits of ST Person educated: Patient Education method: Explanation, Demonstration, Tactile cues, and Verbal cues Education comprehension: verbalized understanding  HOME EXERCISE PROGRAM: Not initiated    GOALS: Goals reviewed with patient? Yes  SHORT TERM GOALS: Target date: 10/21/2022  Patient to be independent with initial HEP. Baseline: HEP initiated Goal status: IN PROGRESS    LONG TERM GOALS: Target date: 11/11/2022  Patient to be independent with advanced HEP. Baseline: Not yet initiated  Goal status: IN PROGRESS  Patient to improve MiniBestTest score to atleast 17-21 to decrease risk of falls.  Baseline: not yet completed  Goal status: IN PROGRESS  Patient to report 50% improvement in balance confidence during [employer] classes.  Baseline: reports fear of falling  Goal status: IN PROGRESS  Patient to demonstrate 5xSTS test in <15 sec in order to decrease risk of falls.   Baseline: 23.41 sec pushing off knees   unable to stand fully on several reps Goal status: IN  PROGRESS  Patient to verbalize understanding of fall prevention in home environment information. Baseline: Not yet initiated Goal status: IN PROGRESS    ASSESSMENT:  CLINICAL IMPRESSION: Returns to clinic with report of ongoing low back pain R>L limiting all activities at this time. Initiated with NU-step and MHP to low back with LE only and then introducing arms for last 3 min to encourage gentle trunk rotation.  Soft tissue mobilization to lumbar spine with palpable trigger point to right quadratus lumborum appreciated and general, diffuse discomfort throughout lumbar fascia, erectors, QL.  Soft tissue mob to reduce pain, guarding and mild-moderate Shiatsu applied to right QL followed by myofascial release to improve tissue mobility and reduce guarding. Kinesiotape applied to right QL for inhibition and facilitation of erectors to reduce pain and improve comfort for support. Verbalized understanding of instructions with K-tape and reinforced to spouse for removal and skin hygiene.  Instructed in modified QL stretch to reduce pain/guarding. Continued sessions indicated to progress activity tolerance and POC details.   OBJECTIVE IMPAIRMENTS: Abnormal gait, decreased activity tolerance, decreased balance, decreased endurance, decreased ROM, impaired flexibility, and impaired tone.   ACTIVITY LIMITATIONS: carrying, lifting, bending, sitting, standing, squatting, sleeping, stairs, transfers, bed mobility, bathing, toileting, dressing, reach over head, and hygiene/grooming  PARTICIPATION LIMITATIONS: meal prep, cleaning, laundry, shopping, community activity, and church  PERSONAL FACTORS: Age, Past/current experiences, Time since onset of injury/illness/exacerbation, and 3+ comorbidities: CAD, DM, HLD, HTN, prostate CA, CABG 2004  are also affecting patient's functional outcome.   REHAB POTENTIAL: Good  CLINICAL DECISION MAKING: Evolving/moderate complexity  EVALUATION COMPLEXITY:  Moderate  PLAN:  PT FREQUENCY: 2x/week  PT DURATION: 6 weeks  PLANNED INTERVENTIONS: Therapeutic exercises, Therapeutic activity, Neuromuscular re-education, Balance training, Gait training, Patient/Family education, Self Care, Joint mobilization, Stair training, Vestibular training, Canalith repositioning, DME instructions, Aquatic Therapy, Dry Needling, Electrical stimulation, Cryotherapy, Moist heat, Taping, Manual therapy, and Re-evaluation  PLAN FOR NEXT SESSION:  complete mini best test (as pain allows); initiate HEP for STS and static and dynamic balance.  Review initial HEP    10:47 AM, 10/12/22 M. Shary Decamp, PT, DPT Physical Therapist- [place] Office Number: [phone]

## 2022-10-12 NOTE — Therapy (Signed)
OUTPATIENT OCCUPATIONAL THERAPY [facility] Treatment Note  Patient Name: Johnny Fox MRN: [id number] DOB:[birth year], 89 y.o., male Today's Date: 10/12/2022  PCP: Emilio Aspen, MD REFERRING PROVIDER: Vladimir Faster, DO  END OF SESSION:  OT End of Session - 10/12/22 1021     Visit Number 3    Number of Visits 13    Date for OT Re-Evaluation 11/13/22    Authorization Type Medicare A&B    OT Start Time 1018    OT Stop Time 1058    OT Time Calculation (min) 40 min              Past Medical History:  Diagnosis Date   Actinic keratoses    and seborrheic keratoses   Arrhythmia    afib postoperatively after CABG in 2004, No recurrence   Coronary artery disease    Diabetes mellitus without complication (HCC)    diet controlled   Dyslipidemia    ED (erectile dysfunction)    Elevated PSA    2010, workup with Dr. Isabel Caprice   H/O prostate biopsy    Hypertension    Mild carotid artery disease (HCC)    Onychomycosis    Prostate cancer (HCC)    PSA's run approx 7- 2011-2013   Right shoulder injury    july 2014, Dr. Caryn Bee Supple   Tick bite of abdomen    2013, treated with 3wks of doxycycline, tick engorged 3.20mm nodule left thyroid 4/14- seen on carotid u/s   Past Surgical History:  Procedure Laterality Date   CARDIAC SURGERY     CATARACT EXTRACTION, BILATERAL     CORONARY ARTERY BYPASS GRAFT  2004   Patient Active Problem List   Diagnosis Date Noted   Bilateral impacted cerumen 10/09/2022   Sensorineural hearing loss, bilateral 10/09/2022   Aortic aneurysm (HCC) 03/26/2021   Abnormal gait 03/13/2020   Bronchitis 03/13/2020   Chin laceration 03/13/2020   Cough 03/13/2020   Cramp and spasm 03/13/2020   Disorder of penis 03/13/2020   Dizzy spells 03/13/2020   Dyspnea 03/13/2020   Edema of lower extremity 03/13/2020   Elevated PSA 03/13/2020   Encounter for general adult medical examination without abnormal findings 03/13/2020   History of  malignant neoplasm of prostate 03/13/2020   Low back pain 03/13/2020   Malignant tumor of prostate (HCC) 03/13/2020   Neuropathy 03/13/2020   Orthostatic hypotension 03/13/2020   Other long term (current) drug therapy 03/13/2020   Other specified abnormal findings of blood chemistry 03/13/2020   Other sprain of right hip, initial encounter 03/13/2020   Paresthesia 03/13/2020   Parkinson's disease (HCC) 03/13/2020   Paroxysmal atrial fibrillation (HCC) 03/13/2020   Polyneuropathy due to type 2 diabetes mellitus (HCC) 03/13/2020   Presence of aortocoronary bypass graft 03/13/2020   Sciatica, right side 03/13/2020   Shoulder joint pain 03/13/2020   Skin sensation disturbance 03/13/2020   Type 2 diabetes mellitus without complications (HCC) 03/13/2020   Upper respiratory infection 03/13/2020   Vitamin D deficiency 03/13/2020   Right leg numbness 01/14/2015   Thyroid nodule 01/14/2015   Coronary atherosclerosis of native coronary artery 02/15/2013   Essential hypertension, benign 02/15/2013   Pure hypercholesterolemia 02/15/2013   Fatigue 02/15/2013    ONSET DATE: referral 09/17/22 (PD diagnosis May 2021)  REFERRING DIAG: G20.A1 (ICD-10-CM) - Parkinson's disease without dyskinesia or fluctuating manifestations  THERAPY DIAG:  Unsteadiness on feet  Muscle weakness (generalized)  Other symptoms and signs involving the nervous system  Other lack of coordination  Rationale for Evaluation and Treatment: Rehabilitation  SUBJECTIVE:   SUBJECTIVE STATEMENT: Pt reports PT applied kinesiotape to attempt to alleviate pain in back. Pt accompanied by: self and family member (spouse remained in waiting room)  PERTINENT HISTORY: ET/PD diagnosed approx May 2021; PMH includes repeated falls, CAD, HTN, afib s/p CABG in 2004, hx of prostate cancer, DMT2, and chronic R shoulder pain, hearing loss  PRECAUTIONS: Fall  WEIGHT BEARING RESTRICTIONS: No  PAIN:  Are you having pain? Yes: NPRS  scale: 7-8/10 Pain location: back Pain description: sharp, shooting Aggravating factors: positional Relieving factors: position, medical patch  FALLS: Has patient fallen in last 6 months? Yes. Number of falls 1, pt reports having one fall (backwards) walking through family room, but does not feel like he tripped   LIVING ENVIRONMENT: Lives with: lives with their spouse Lives in: House/apartment Stairs:  level entry in the back; a few steps to enter the front; 2 steps inside from family room to sun room Has following equipment at home: Single point cane, Environmental consultant - 4 wheeled, Shower bench (built in), bed side commode, and Grab bars  PLOF: Requires assistive device for independence  PATIENT GOALS: improve on my balane  OBJECTIVE:   HAND DOMINANCE: Right  ADLs: Overall ADLs: Increased time Transfers/ambulation related to ADLs: Using [facility] for mobility in home and in community, using RW at home as well Eating: decreased grip strength to open containers, reports some recent difficulty with swallowing Grooming: some difficulty with combing hair due to "bad shoulder" on R from fall many years ago UB Dressing: recently increased difficulty with buttons (does typically wear collared shirts with buttons) LB Dressing: sometimes needs assistance with tying L shoe Toileting: Mod I Bathing: Mod I Tub Shower transfers: Mod I Equipment: Grab bars, Walk in shower, and bed side commode  IADLs: Light housekeeping: washes dishes, help with making the bed every morning Meal Prep: spouse does all the cooking Community mobility: no longer driving Medication management: wife fills pill box and reorders as needed Handwriting: 90% legible, Mild micrographia, and 12.47 sec on PPT #1 (whales live in a [place].)  MOBILITY STATUS: Hx of falls  POSTURE COMMENTS:   rounded shoulders, forward head, and increased thoracic kyphosis  ACTIVITY TOLERANCE: Activity tolerance: WFL for tasks assessed on  eval  FUNCTIONAL OUTCOME MEASURES: Standing functional reach: Right: 5.25 inches; Left: 5 inches Fastening/unfastening 3 buttons: 1:13.28 Physical performance test: PPT#2 (simulated eating) 20.19 sec (noted mild tremor when scooping) & PPT#4 (donning/doffing jacket): 20.95 sec  COORDINATION: Finger Nose Finger test: Mild B tremors, good speed 9 Hole Peg test: Right: 33.78 sec; Left: 38.41 sec Box and Blocks:  Right 43 blocks, Left 40 blocks Tremors: action and Right  UE ROM:   R shoulder limited due to pain, shoulder flexion grossly 130, able to achieve internal and external rotation with reports of mild pain  UE MMT:  R shoulder limited due to pain (grossly 3+/5); LUE WFL (grossly 4+5), B elbows grossly 4+/5   Grip strength: R: 38# and L: 45#  SENSATION: WFL  COGNITION: Overall cognitive status: Within functional limits for tasks assessed; pt and spouse report some trouble with memory but wife notes his is not worse than hers. He has mostly trouble with names.  OBSERVATIONS: Bradykinesia   TODAY'S TREATMENT:  DATE:  10/12/22 Coordination: engaged in small peg board pattern replication with RUE and then LUE with focus on coordination and quality of picking up pegs.  OT providing verbal cues to utilize hand flicks as needed for coordination and large amplitude movements as needed.  Pt completing with min drops with RUE, no drops with LUE. Large amplitude: completed large amplitude "finger flicks" with finger extension and forearm extension.  Pt completed x10 with min cues for quality of movement. Buttons: engaged in buttoning/unbuttoning buttons with focus on grasp/release pattern and quality of movement.  Pt still requiring increased time, but demonstrating improved quality of movement.  Completed 3 button/unbutton in 40.94 sec! Cup stacking: stacking cups in 10  frame pyramid form to facilitate increased functional and sustained grasp on cups.  Pt demonstrating improved coordination and control when picking up, stacking, and unstacking cups with RUE and LUE, dropping x1 bilaterally when stacking.  OT providing cues to focus on full hand placement on cup to increase grasp and motor control.  Increased challenge to alternating hands with stacking to challenge alternating motor control, no drops.    10/07/22 Coordination: engaged in card sorting with focus on large amplitude reach and forearm supination.  OT providing verbal cues and demonstration for amplitude. Pt with increased difficulty with RUE due to h/o shoulder injury and pain.  Attempted sliding cards off deck with thumb, however pt with increased difficulty with thumb movements, therefore transitioned to use with coins. Pt demonstrating improved amplitude and coordination with translation of coins from palm to finger tips.  Engaged in rotation of cards, followed by coins clockwise and counter clockwise.  Pt with increased difficulty when rotating counter clockwise.   Picking up and stacking coins with OT providing cues to pick up and not slide pieces along table top.  Discussed functional carryover of above tasks to incorporate shuffling to deal and play a card game, completing jigsaw puzzles, and even self-care tasks of donning a jacket, shirt, or even a belt. Reviewed large amplitude hands with OT providing cues for amplitude and ROM.  Encouraged use prior to [facility] tasks.   09/30/22 NA, eval only    PATIENT EDUCATION: Education details: ongoing condition specific education Person educated: Patient Education method: Explanation Education comprehension: verbalized understanding and needs further education  HOME EXERCISE PROGRAM: Coordination handout - see pt instructions  GOALS: Goals reviewed with patient? Yes  SHORT TERM GOALS: Target date: 10/23/22  Pt will be independent with PD specific  HEP Baseline: Goal status: IN PROGRESS  2.  Pt will verbalize understanding of strategies to decrease risk of further complication related to PD, including corresponding energy conservation strategies to improve participation in IADLs  Baseline:  Goal status: IN PROGRESS  3.  Pt will demonstrate increased ease w/ manipulation of clothing fasteners by decreasing time to complete 3 buttons to less than 60 seconds. Baseline: 1:13.28 Goal status: IN PROGRESS   LONG TERM GOALS: Target date: 11/13/22  Pt will improve participation in self-feeding as evidenced by decreasing time to move 5 beans from a bowl w/ a spoon, incorporating compensatory strategies/AE prn  Baseline: 20.19 sec  Goal status: IN PROGRESS  2.  Pt will demonstrate increased ease w/ manipulation of clothing fasteners by decreasing time to complete 3 buttons to less than 50 seconds. Baseline: 1:13.28 Goal status: IN PROGRESS  3.  Pt will demonstrate increased ease with dressing as evidenced by decreasing PPT#4(don/ doff jacket) to 15 secs or less Baseline: 20.95 sec Goal status: IN PROGRESS  4.  Pt will demonstrate improved dynamic standing balance and endurance as needed to complete grooming and/or housekeeping tasks with improved ease. Baseline: reports pain in lower back with prolonged standing. Goal status: IN PROGRESS  ASSESSMENT:  CLINICAL IMPRESSION: Pt still limited in engagement in more dynamic standing and balance tasks due to pain in back, therefore engaged in all tasks in sitting with back support.  Pt did require slight trunk rotation intermittently with table top tasks, but no c/o increased pain.  OT providing cues for amplitude and carryover of coordination tasks to leisure and functional tasks.  OT reviewed education to keep movements more active by modifying grasp/pressure on items (cup, pen, eating utensil), pt reporting understanding.     PERFORMANCE DEFICITS: in functional skills including ADLs,  IADLs, coordination, ROM, strength, pain, flexibility, Fine motor control, Gross motor control, balance, body mechanics, endurance, decreased knowledge of precautions, decreased knowledge of use of DME, and UE functional use, cognitive skills including memory, and psychosocial skills including coping strategies and environmental adaptation.   IMPAIRMENTS: are limiting patient from ADLs, IADLs, and rest and sleep.   COMORBIDITIES:  may have co-morbidities  that affects occupational performance. Patient will benefit from skilled OT to address above impairments and improve overall function.  MODIFICATION OR ASSISTANCE TO COMPLETE EVALUATION: Min-Moderate modification of tasks or assist with assess necessary to complete an evaluation.  OT OCCUPATIONAL PROFILE AND HISTORY: Detailed assessment: Review of records and additional review of physical, cognitive, psychosocial history related to current functional performance.  CLINICAL DECISION MAKING: Moderate - several treatment options, min-mod task modification necessary  REHAB POTENTIAL: Good  EVALUATION COMPLEXITY: Moderate    PLAN:  OT FREQUENCY: 2x/week  OT DURATION: 6 weeks  PLANNED INTERVENTIONS: self care/ADL training, therapeutic exercise, therapeutic activity, neuromuscular re-education, manual therapy, passive range of motion, balance training, functional mobility training, ultrasound, compression bandaging, moist heat, cryotherapy, patient/family education, cognitive remediation/compensation, psychosocial skills training, energy conservation, coping strategies training, and DME and/or AE instructions  RECOMMENDED OTHER SERVICES: NA  CONSULTED AND AGREED WITH PLAN OF CARE: Patient  PLAN FOR NEXT SESSION: Review coordination HEP, initiate shoulder ROM and stretches, large amplitude (be aware of R shoulder pain)   Kasha Howeth, OTR/L 10/12/2022, 10:21 AM

## 2022-10-14 ENCOUNTER — Ambulatory Visit: Payer: Medicare Other | Admitting: Physical Therapy

## 2022-10-14 ENCOUNTER — Encounter: Payer: Medicare Other | Admitting: Occupational Therapy

## 2022-10-14 DIAGNOSIS — M545 Low back pain, unspecified: Secondary | ICD-10-CM | POA: Diagnosis not present

## 2022-10-14 DIAGNOSIS — H524 Presbyopia: Secondary | ICD-10-CM | POA: Diagnosis not present

## 2022-10-14 DIAGNOSIS — H5203 Hypermetropia, bilateral: Secondary | ICD-10-CM | POA: Diagnosis not present

## 2022-10-14 DIAGNOSIS — Z9849 Cataract extraction status, unspecified eye: Secondary | ICD-10-CM | POA: Diagnosis not present

## 2022-10-14 DIAGNOSIS — M7918 Myalgia, other site: Secondary | ICD-10-CM | POA: Diagnosis not present

## 2022-10-14 DIAGNOSIS — H3530 Unspecified macular degeneration: Secondary | ICD-10-CM | POA: Diagnosis not present

## 2022-10-14 DIAGNOSIS — H43393 Other vitreous opacities, bilateral: Secondary | ICD-10-CM | POA: Diagnosis not present

## 2022-10-14 DIAGNOSIS — H53143 Visual discomfort, bilateral: Secondary | ICD-10-CM | POA: Diagnosis not present

## 2022-10-14 DIAGNOSIS — I10 Essential (primary) hypertension: Secondary | ICD-10-CM | POA: Diagnosis not present

## 2022-10-14 DIAGNOSIS — H35033 Hypertensive retinopathy, bilateral: Secondary | ICD-10-CM | POA: Diagnosis not present

## 2022-10-14 DIAGNOSIS — H52223 Regular astigmatism, bilateral: Secondary | ICD-10-CM | POA: Diagnosis not present

## 2022-10-14 DIAGNOSIS — Z961 Presence of intraocular lens: Secondary | ICD-10-CM | POA: Diagnosis not present

## 2022-10-15 DIAGNOSIS — N401 Enlarged prostate with lower urinary tract symptoms: Secondary | ICD-10-CM | POA: Diagnosis not present

## 2022-10-15 DIAGNOSIS — R351 Nocturia: Secondary | ICD-10-CM | POA: Diagnosis not present

## 2022-10-15 DIAGNOSIS — R350 Frequency of micturition: Secondary | ICD-10-CM | POA: Diagnosis not present

## 2022-10-15 DIAGNOSIS — R3915 Urgency of urination: Secondary | ICD-10-CM | POA: Diagnosis not present

## 2022-10-15 NOTE — Therapy (Signed)
OUTPATIENT PHYSICAL THERAPY NEURO TREATMENT   Patient Name: Johnny Fox MRN: [id number] DOB:1928-03-10, 89 y.o., male Today's Date: 10/16/2022   PCP: Emilio Aspen, MD  REFERRING PROVIDER: Vladimir Faster, DO  END OF SESSION:  PT End of Session - 10/16/22 1052     Visit Number 5    Number of Visits 13    Date for PT Re-Evaluation 11/11/22    Authorization Type Medicare/BCBS    PT Start Time 1018    PT Stop Time 1048    PT Time Calculation (min) 30 min    Equipment Utilized During Treatment Gait belt    Activity Tolerance Patient limited by pain    Behavior During Therapy WFL for tasks assessed/performed               Past Medical History:  Diagnosis Date   Actinic keratoses    and seborrheic keratoses   Arrhythmia    afib postoperatively after CABG in 2004, No recurrence   Coronary artery disease    Diabetes mellitus without complication (HCC)    diet controlled   Dyslipidemia    ED (erectile dysfunction)    Elevated PSA    2010, workup with Dr. Isabel Caprice   H/O prostate biopsy    Hypertension    Mild carotid artery disease (HCC)    Onychomycosis    Prostate cancer (HCC)    PSA's run approx 7- 2011-2013   Right shoulder injury    july 2014, Dr. Caryn Bee Supple   Tick bite of abdomen    2013, treated with 3wks of doxycycline, tick engorged 3.13mm nodule left thyroid 4/14- seen on carotid u/s   Past Surgical History:  Procedure Laterality Date   CARDIAC SURGERY     CATARACT EXTRACTION, BILATERAL     CORONARY ARTERY BYPASS GRAFT  2004   Patient Active Problem List   Diagnosis Date Noted   Bilateral impacted cerumen 10/09/2022   Sensorineural hearing loss, bilateral 10/09/2022   Aortic aneurysm (HCC) 03/26/2021   Abnormal gait 03/13/2020   Bronchitis 03/13/2020   Chin laceration 03/13/2020   Cough 03/13/2020   Cramp and spasm 03/13/2020   Disorder of penis 03/13/2020   Dizzy spells 03/13/2020   Dyspnea 03/13/2020   Edema of lower  extremity 03/13/2020   Elevated PSA 03/13/2020   Encounter for general adult medical examination without abnormal findings 03/13/2020   History of malignant neoplasm of prostate 03/13/2020   Low back pain 03/13/2020   Malignant tumor of prostate (HCC) 03/13/2020   Neuropathy 03/13/2020   Orthostatic hypotension 03/13/2020   Other long term (current) drug therapy 03/13/2020   Other specified abnormal findings of blood chemistry 03/13/2020   Other sprain of right hip, initial encounter 03/13/2020   Paresthesia 03/13/2020   Parkinson's disease (HCC) 03/13/2020   Paroxysmal atrial fibrillation (HCC) 03/13/2020   Polyneuropathy due to type 2 diabetes mellitus (HCC) 03/13/2020   Presence of aortocoronary bypass graft 03/13/2020   Sciatica, right side 03/13/2020   Shoulder joint pain 03/13/2020   Skin sensation disturbance 03/13/2020   Type 2 diabetes mellitus without complications (HCC) 03/13/2020   Upper respiratory infection 03/13/2020   Vitamin D deficiency 03/13/2020   Right leg numbness 01/14/2015   Thyroid nodule 01/14/2015   Coronary atherosclerosis of native coronary artery 02/15/2013   Essential hypertension, benign 02/15/2013   Pure hypercholesterolemia 02/15/2013   Fatigue 02/15/2013    ONSET DATE: months  REFERRING DIAG: G20.A1 (ICD-10-CM) - Parkinson's disease without dyskinesia or fluctuating manifestations  THERAPY DIAG:  Other symptoms and signs involving the nervous system  Other lack of coordination  Muscle weakness (generalized)  Unsteadiness on feet  Other abnormalities of gait and mobility  Rationale for Evaluation and Treatment: Rehabilitation  SUBJECTIVE:                                                                                                                                                                                             SUBJECTIVE STATEMENT: Having 10/10 back pain- I'm suffering. Denies N/T or B&B changes. Reports that he is not  sure if he has had an x-ray on his back since he hurt it. Wife reports that he did have an x-ray which looked normal. Was told by the MD that it was a back spasm and was given cortisone injection.   Pt accompanied by: self  PERTINENT HISTORY: CAD, DM, HLD, HTN, prostate CA, CABG 2004  PAIN:  Are you having pain? Yes: NPRS scale: "10"/10 Pain location: low back/lumbar Pain description: sharp, ache, sore Aggravating factors: movement Relieving factors: tylenol and salon pas  PRECAUTIONS: Fall  RED FLAGS: None   WEIGHT BEARING RESTRICTIONS: No  FALLS: Has patient fallen in last 6 months? Yes. Number of falls 1  LIVING ENVIRONMENT: Lives with: lives with their spouse Lives in: House/apartment Stairs:  no steps to enter; 2 steps to sunroom Has following equipment at home: Environmental consultant - 4 wheeled, shower chair, Grab bars, and cane  PLOF: Independent; retired   PATIENT GOALS: "improve on my ability to move around"  OBJECTIVE:    TODAY'S TREATMENT: 10/16/22 Activity Comments  Sitting STM and manual TPR  Very tight and TTP over R paraspinals and QL; instructed pt on pursed lip breathing to reduce guarding  Edu, demo, practice using ball on wall self-STM to R QL                 PATIENT EDUCATION: Education details: discussion with wife and pt about pt's back pain; edu on e-stim and potential benefits but would f/u with MD before proceeding d/t hx of prostate CA; advised to present to ED d/t severity of pain if it continues  Person educated: Patient and Spouse Education method: Explanation Education comprehension: verbalized understanding   Access Code: [email or web address] URL: [email or web address] Date: 10/07/2022 Prepared by: [facility] - Outpatient  Rehab - [facility]  Exercises - Seated Long Arc Quad  - 1 x daily - 5 x weekly - 2 sets - 10 reps - Seated March  - 1 x daily - 5 x weekly - 2 sets - 10 reps - Seated Ankle Dorsiflexion AROM  - 1  x daily - 5 x  weekly - 2 sets - 10 reps - Supine Lower Trunk Rotation  - 1 x daily - 7 x weekly - 1-2 sets - 10 reps - Hooklying Single Knee to Chest  - 1 x daily - 7 x weekly - 1 sets - 3 reps - 15-30 sec hold      --------------------------------------------------------------------  DIAGNOSTIC FINDINGS: none recent  COGNITION: Overall cognitive status: Within functional limits for tasks assessed   SENSATION: Reports baseline N/T in B feet d/t hx neuropathy   COORDINATION: Alternating pronation/supination: B WNL Alternating toe tap: B WNL Finger to nose: B WNL   MUSCLE TONE: significant rigidity in B LEs    POSTURE: rounded shoulders, forward head, and increased thoracic kyphosis  LOWER EXTREMITY ROM:     Active  Right Eval Left Eval  Hip flexion    Hip extension    Hip abduction    Hip adduction    Hip internal rotation    Hip external rotation    Knee flexion    Knee extension    Ankle dorsiflexion 8 5  Ankle plantarflexion    Ankle inversion    Ankle eversion     (Blank rows = not tested)  LOWER EXTREMITY MMT:    MMT (in sitting) Right Eval Left Eval  Hip flexion 4+ 4+  Hip extension    Hip abduction 4+ 4+  Hip adduction 4 4  Hip internal rotation    Hip external rotation    Knee flexion 4+ 4+  Knee extension 4+ 4+  Ankle dorsiflexion 4+ 4+  Ankle plantarflexion 4+ 4+  Ankle inversion    Ankle eversion    (Blank rows = not tested)   GAIT: Gait pattern: B short step length; occasional freezing Assistive device utilized: Single point cane Level of assistance: Modified independence and SBA   FUNCTIONAL TESTS:    *patient reported fatigue with testing today and required sit break in between tasks    TODAY'S TREATMENT:                                                                                                                              DATE: 09/30/22    PATIENT EDUCATION: Education details: prognosis, POC, benefits of ST Person educated:  Patient Education method: Explanation, Demonstration, Tactile cues, and Verbal cues Education comprehension: verbalized understanding  HOME EXERCISE PROGRAM: Not initiated    GOALS: Goals reviewed with patient? Yes  SHORT TERM GOALS: Target date: 10/21/2022  Patient to be independent with initial HEP. Baseline: HEP initiated Goal status: IN PROGRESS    LONG TERM GOALS: Target date: 11/11/2022  Patient to be independent with advanced HEP. Baseline: Not yet initiated  Goal status: IN PROGRESS  Patient to improve MiniBestTest score to atleast 17-21 to decrease risk of falls.  Baseline: not yet completed  Goal status: IN PROGRESS  Patient to report 50% improvement in balance confidence during [employer] classes.  Baseline: reports fear of falling  Goal status: IN PROGRESS  Patient to demonstrate 5xSTS test in <15 sec in order to decrease risk of falls.   Baseline: 23.41 sec pushing off knees   unable to stand fully on several reps Goal status: IN PROGRESS  Patient to verbalize understanding of fall prevention in home environment information. Baseline: Not yet initiated Goal status: IN PROGRESS    ASSESSMENT:  CLINICAL IMPRESSION: Patient arrived to session after OT session with c/o 10/10 R sided LBP. Denies N/T or B&B changes. Per wife- patient has had lumbar x-ray which was normal and received cortisone injection yesterday without improvement. Provided MT to address soft tissue restriction and pain in LB and educated on self-massage. Educated patient and wife on potential benefits of TENS however will reach out to MD for approval of use of this device d/t hx of prostate CA. Patient reported little improvement in LBP upon leaving.   OBJECTIVE IMPAIRMENTS: Abnormal gait, decreased activity tolerance, decreased balance, decreased endurance, decreased ROM, impaired flexibility, and impaired tone.   ACTIVITY LIMITATIONS: carrying, lifting, bending, sitting, standing,  squatting, sleeping, stairs, transfers, bed mobility, bathing, toileting, dressing, reach over head, and hygiene/grooming  PARTICIPATION LIMITATIONS: meal prep, cleaning, laundry, shopping, community activity, and church  PERSONAL FACTORS: Age, Past/current experiences, Time since onset of injury/illness/exacerbation, and 3+ comorbidities: CAD, DM, HLD, HTN, prostate CA, CABG 2004  are also affecting patient's functional outcome.   REHAB POTENTIAL: Good  CLINICAL DECISION MAKING: Evolving/moderate complexity  EVALUATION COMPLEXITY: Moderate  PLAN:  PT FREQUENCY: 2x/week  PT DURATION: 6 weeks  PLANNED INTERVENTIONS: Therapeutic exercises, Therapeutic activity, Neuromuscular re-education, Balance training, Gait training, Patient/Family education, Self Care, Joint mobilization, Stair training, Vestibular training, Canalith repositioning, DME instructions, Aquatic Therapy, Dry Needling, Electrical stimulation, Cryotherapy, Moist heat, Taping, Manual therapy, and Re-evaluation  PLAN FOR NEXT SESSION:  complete mini best test (as pain allows); initiate HEP for STS and static and dynamic balance.  Review initial HEP   Anette Guarneri, PT, DPT 10/16/22 10:53 AM  [facility] at [facility] [address] [place], [address] [place], [place] [address] Phone # [phone] Fax # [phone]

## 2022-10-16 ENCOUNTER — Ambulatory Visit: Payer: Medicare Other

## 2022-10-16 ENCOUNTER — Ambulatory Visit: Payer: Medicare Other | Admitting: Occupational Therapy

## 2022-10-16 ENCOUNTER — Telehealth: Payer: Self-pay | Admitting: Physical Therapy

## 2022-10-16 ENCOUNTER — Ambulatory Visit: Payer: Medicare Other | Admitting: Physical Therapy

## 2022-10-16 ENCOUNTER — Encounter: Payer: Self-pay | Admitting: Physical Therapy

## 2022-10-16 DIAGNOSIS — M6281 Muscle weakness (generalized): Secondary | ICD-10-CM

## 2022-10-16 DIAGNOSIS — R1312 Dysphagia, oropharyngeal phase: Secondary | ICD-10-CM | POA: Diagnosis not present

## 2022-10-16 DIAGNOSIS — R29818 Other symptoms and signs involving the nervous system: Secondary | ICD-10-CM | POA: Diagnosis not present

## 2022-10-16 DIAGNOSIS — R278 Other lack of coordination: Secondary | ICD-10-CM

## 2022-10-16 DIAGNOSIS — R2681 Unsteadiness on feet: Secondary | ICD-10-CM

## 2022-10-16 DIAGNOSIS — R2689 Other abnormalities of gait and mobility: Secondary | ICD-10-CM | POA: Diagnosis not present

## 2022-10-16 NOTE — Therapy (Signed)
OUTPATIENT OCCUPATIONAL THERAPY [facility] Treatment Note  Patient Name: Johnny Fox MRN: [id number] DOB:[birth year], 89 y.o., male Today's Date: 10/16/2022  PCP: Emilio Aspen, MD REFERRING PROVIDER: Vladimir Faster, DO  END OF SESSION:  OT End of Session - 10/16/22 0932     Visit Number 4    Number of Visits 13    Date for OT Re-Evaluation 11/13/22    Authorization Type Medicare A&B    OT Start Time 0932    OT Stop Time 1014    OT Time Calculation (min) 42 min               Past Medical History:  Diagnosis Date   Actinic keratoses    and seborrheic keratoses   Arrhythmia    afib postoperatively after CABG in 2004, No recurrence   Coronary artery disease    Diabetes mellitus without complication (HCC)    diet controlled   Dyslipidemia    ED (erectile dysfunction)    Elevated PSA    2010, workup with Dr. Isabel Caprice   H/O prostate biopsy    Hypertension    Mild carotid artery disease (HCC)    Onychomycosis    Prostate cancer (HCC)    PSA's run approx 7- 2011-2013   Right shoulder injury    july 2014, Dr. Caryn Bee Supple   Tick bite of abdomen    2013, treated with 3wks of doxycycline, tick engorged 3.48mm nodule left thyroid 4/14- seen on carotid u/s   Past Surgical History:  Procedure Laterality Date   CARDIAC SURGERY     CATARACT EXTRACTION, BILATERAL     CORONARY ARTERY BYPASS GRAFT  2004   Patient Active Problem List   Diagnosis Date Noted   Bilateral impacted cerumen 10/09/2022   Sensorineural hearing loss, bilateral 10/09/2022   Aortic aneurysm (HCC) 03/26/2021   Abnormal gait 03/13/2020   Bronchitis 03/13/2020   Chin laceration 03/13/2020   Cough 03/13/2020   Cramp and spasm 03/13/2020   Disorder of penis 03/13/2020   Dizzy spells 03/13/2020   Dyspnea 03/13/2020   Edema of lower extremity 03/13/2020   Elevated PSA 03/13/2020   Encounter for general adult medical examination without abnormal findings 03/13/2020   History of  malignant neoplasm of prostate 03/13/2020   Low back pain 03/13/2020   Malignant tumor of prostate (HCC) 03/13/2020   Neuropathy 03/13/2020   Orthostatic hypotension 03/13/2020   Other long term (current) drug therapy 03/13/2020   Other specified abnormal findings of blood chemistry 03/13/2020   Other sprain of right hip, initial encounter 03/13/2020   Paresthesia 03/13/2020   Parkinson's disease (HCC) 03/13/2020   Paroxysmal atrial fibrillation (HCC) 03/13/2020   Polyneuropathy due to type 2 diabetes mellitus (HCC) 03/13/2020   Presence of aortocoronary bypass graft 03/13/2020   Sciatica, right side 03/13/2020   Shoulder joint pain 03/13/2020   Skin sensation disturbance 03/13/2020   Type 2 diabetes mellitus without complications (HCC) 03/13/2020   Upper respiratory infection 03/13/2020   Vitamin D deficiency 03/13/2020   Right leg numbness 01/14/2015   Thyroid nodule 01/14/2015   Coronary atherosclerosis of native coronary artery 02/15/2013   Essential hypertension, benign 02/15/2013   Pure hypercholesterolemia 02/15/2013   Fatigue 02/15/2013    ONSET DATE: referral 09/17/22 (PD diagnosis May 2021)  REFERRING DIAG: G20.A1 (ICD-10-CM) - Parkinson's disease without dyskinesia or fluctuating manifestations  THERAPY DIAG:  Other symptoms and signs involving the nervous system  Other lack of coordination  Muscle weakness (generalized)  Unsteadiness on feet  Rationale for Evaluation and Treatment: Rehabilitation  SUBJECTIVE:   SUBJECTIVE STATEMENT: Pt reports very painful today, had a cortisone shot yesterday.  Pt reports that he is using heat and ice and alternating pain meds.  Pt reports getting in and out of the car was not easy this morning.  Pt requesting to not engage in any activities that require any movement or strain on back. Pt accompanied by: self and family member (spouse remained in waiting room)  PERTINENT HISTORY: ET/PD diagnosed approx May 2021; PMH  includes repeated falls, CAD, HTN, afib s/p CABG in 2004, hx of prostate cancer, DMT2, and chronic R shoulder pain, hearing loss  PRECAUTIONS: Fall  WEIGHT BEARING RESTRICTIONS: No  PAIN:  Are you having pain? Yes: NPRS scale: 10/10 Pain location: back Pain description: sharp, shooting Aggravating factors: positional Relieving factors: position, medical patch  FALLS: Has patient fallen in last 6 months? Yes. Number of falls 1, pt reports having one fall (backwards) walking through family room, but does not feel like he tripped   LIVING ENVIRONMENT: Lives with: lives with their spouse Lives in: House/apartment Stairs:  level entry in the back; a few steps to enter the front; 2 steps inside from family room to sun room Has following equipment at home: Single point cane, Environmental consultant - 4 wheeled, Shower bench (built in), bed side commode, and Grab bars  PLOF: Requires assistive device for independence  PATIENT GOALS: improve on my balane  OBJECTIVE:   HAND DOMINANCE: Right  ADLs: Overall ADLs: Increased time Transfers/ambulation related to ADLs: Using [facility] for mobility in home and in community, using RW at home as well Eating: decreased grip strength to open containers, reports some recent difficulty with swallowing Grooming: some difficulty with combing hair due to "bad shoulder" on R from fall many years ago UB Dressing: recently increased difficulty with buttons (does typically wear collared shirts with buttons) LB Dressing: sometimes needs assistance with tying L shoe Toileting: Mod I Bathing: Mod I Tub Shower transfers: Mod I Equipment: Grab bars, Walk in shower, and bed side commode  IADLs: Light housekeeping: washes dishes, help with making the bed every morning Meal Prep: spouse does all the cooking Community mobility: no longer driving Medication management: wife fills pill box and reorders as needed Handwriting: 90% legible, Mild micrographia, and 12.47 sec on PPT #1  (whales live in a [place].)  MOBILITY STATUS: Hx of falls  POSTURE COMMENTS:   rounded shoulders, forward head, and increased thoracic kyphosis  ACTIVITY TOLERANCE: Activity tolerance: WFL for tasks assessed on eval  FUNCTIONAL OUTCOME MEASURES: Standing functional reach: Right: 5.25 inches; Left: 5 inches Fastening/unfastening 3 buttons: 1:13.28 Physical performance test: PPT#2 (simulated eating) 20.19 sec (noted mild tremor when scooping) & PPT#4 (donning/doffing jacket): 20.95 sec  COORDINATION: Finger Nose Finger test: Mild B tremors, good speed 9 Hole Peg test: Right: 33.78 sec; Left: 38.41 sec Box and Blocks:  Right 43 blocks, Left 40 blocks Tremors: action and Right  UE ROM:   R shoulder limited due to pain, shoulder flexion grossly 130, able to achieve internal and external rotation with reports of mild pain  UE MMT:  R shoulder limited due to pain (grossly 3+/5); LUE WFL (grossly 4+5), B elbows grossly 4+/5   Grip strength: R: 38# and L: 45#  SENSATION: WFL  COGNITION: Overall cognitive status: Within functional limits for tasks assessed; pt and spouse report some trouble with memory but wife notes his is not worse than hers. He has mostly trouble  with names.  OBSERVATIONS: Bradykinesia   TODAY'S TREATMENT:                                                                                DATE:  10/16/22 Coordination: completed with LUE and RUE with focus on quality and amplitude as appropriate rotating 2 balls in hand clockwise and counter clockwise, transitioned to rotating 2 large dice in hands to correctly create given number.  Also incorporating cognitive challenge, pt able to correctly identify factors but with difficulty with recall when rotating dice to locate named numbers.   Flipping and rotating coins.  Pt initially flipping coins, utilizing table top as assist therefore transitioned to rotating to further challenge coordination and motor control. Pt with  difficulty on L side, utilizing more translation due to decreased control with finger tips. In-hand manipulation with coins. picking up coins 1 at a time and translating palm to fingertips to place into a bowl incorporating small trunk rotation to reach across midline.  Increased challenge to placing into coin slot in same manner.  Pt demonstrating improvements in motor control and translation of coins to finger tips and placing into coin slot.  Pt reports increased pain in back even with slight anterior weight shift and reaching across midline. Buttons: engaged in massed practice with buttoning and unbuttoning.  Then timed with pt completing in 1:26.87, demonstrating increased difficulty with managing buttons as he fatigued.     10/12/22 Coordination: engaged in small peg board pattern replication with RUE and then LUE with focus on coordination and quality of picking up pegs.  OT providing verbal cues to utilize hand flicks as needed for coordination and large amplitude movements as needed.  Pt completing with min drops with RUE, no drops with LUE. Large amplitude: completed large amplitude "finger flicks" with finger extension and forearm extension.  Pt completed x10 with min cues for quality of movement. Buttons: engaged in buttoning/unbuttoning buttons with focus on grasp/release pattern and quality of movement.  Pt still requiring increased time, but demonstrating improved quality of movement.  Completed 3 button/unbutton in 40.94 sec! Cup stacking: stacking cups in 10 frame pyramid form to facilitate increased functional and sustained grasp on cups.  Pt demonstrating improved coordination and control when picking up, stacking, and unstacking cups with RUE and LUE, dropping x1 bilaterally when stacking.  OT providing cues to focus on full hand placement on cup to increase grasp and motor control.  Increased challenge to alternating hands with stacking to challenge alternating motor control, no  drops.    10/07/22 Coordination: engaged in card sorting with focus on large amplitude reach and forearm supination.  OT providing verbal cues and demonstration for amplitude. Pt with increased difficulty with RUE due to h/o shoulder injury and pain.  Attempted sliding cards off deck with thumb, however pt with increased difficulty with thumb movements, therefore transitioned to use with coins. Pt demonstrating improved amplitude and coordination with translation of coins from palm to finger tips.  Engaged in rotation of cards, followed by coins clockwise and counter clockwise.  Pt with increased difficulty when rotating counter clockwise.   Picking up and stacking coins with OT providing cues to pick up  and not slide pieces along table top.  Discussed functional carryover of above tasks to incorporate shuffling to deal and play a card game, completing jigsaw puzzles, and even self-care tasks of donning a jacket, shirt, or even a belt. Reviewed large amplitude hands with OT providing cues for amplitude and ROM.  Encouraged use prior to [facility] tasks.   PATIENT EDUCATION: Education details: ongoing condition specific education Person educated: Patient Education method: Explanation Education comprehension: verbalized understanding and needs further education  HOME EXERCISE PROGRAM: Coordination handout - see pt instructions  GOALS: Goals reviewed with patient? Yes  SHORT TERM GOALS: Target date: 10/23/22  Pt will be independent with PD specific HEP Baseline: Goal status: IN PROGRESS  2.  Pt will verbalize understanding of strategies to decrease risk of further complication related to PD, including corresponding energy conservation strategies to improve participation in IADLs  Baseline:  Goal status: IN PROGRESS  3.  Pt will demonstrate increased ease w/ manipulation of clothing fasteners by decreasing time to complete 3 buttons to less than 60 seconds. Baseline: 1:13.28 Goal status: IN  PROGRESS   LONG TERM GOALS: Target date: 11/13/22  Pt will improve participation in self-feeding as evidenced by decreasing time to move 5 beans from a bowl w/ a spoon, incorporating compensatory strategies/AE prn  Baseline: 20.19 sec  Goal status: IN PROGRESS  2.  Pt will demonstrate increased ease w/ manipulation of clothing fasteners by decreasing time to complete 3 buttons to less than 50 seconds. Baseline: 1:13.28 Goal status: IN PROGRESS  3.  Pt will demonstrate increased ease with dressing as evidenced by decreasing PPT#4(don/ doff jacket) to 15 secs or less Baseline: 20.95 sec Goal status: IN PROGRESS  4.  Pt will demonstrate improved dynamic standing balance and endurance as needed to complete grooming and/or housekeeping tasks with improved ease. Baseline: reports pain in lower back with prolonged standing. Goal status: IN PROGRESS  ASSESSMENT:  CLINICAL IMPRESSION: Pt was quite limited in engagement any tasks beyond static sitting due to extreme pain in back, therefore engaged in all tasks in sitting with back support.  Pt did require slight trunk rotation intermittently and anterior weight shift with table top tasks, therapist attempting to keep at minimum however pt still with c/o pain this session.  OT providing cues for amplitude quality of movement during coordination tasks.    PERFORMANCE DEFICITS: in functional skills including ADLs, IADLs, coordination, ROM, strength, pain, flexibility, Fine motor control, Gross motor control, balance, body mechanics, endurance, decreased knowledge of precautions, decreased knowledge of use of DME, and UE functional use, cognitive skills including memory, and psychosocial skills including coping strategies and environmental adaptation.   IMPAIRMENTS: are limiting patient from ADLs, IADLs, and rest and sleep.   COMORBIDITIES:  may have co-morbidities  that affects occupational performance. Patient will benefit from skilled OT to  address above impairments and improve overall function.  MODIFICATION OR ASSISTANCE TO COMPLETE EVALUATION: Min-Moderate modification of tasks or assist with assess necessary to complete an evaluation.  OT OCCUPATIONAL PROFILE AND HISTORY: Detailed assessment: Review of records and additional review of physical, cognitive, psychosocial history related to current functional performance.  CLINICAL DECISION MAKING: Moderate - several treatment options, min-mod task modification necessary  REHAB POTENTIAL: Good  EVALUATION COMPLEXITY: Moderate    PLAN:  OT FREQUENCY: 2x/week  OT DURATION: 6 weeks  PLANNED INTERVENTIONS: self care/ADL training, therapeutic exercise, therapeutic activity, neuromuscular re-education, manual therapy, passive range of motion, balance training, functional mobility training, ultrasound, compression bandaging, moist heat,  cryotherapy, patient/family education, cognitive remediation/compensation, psychosocial skills training, energy conservation, coping strategies training, and DME and/or AE instructions  RECOMMENDED OTHER SERVICES: NA  CONSULTED AND AGREED WITH PLAN OF CARE: Patient  PLAN FOR NEXT SESSION: Review coordination HEP, initiate shoulder ROM and stretches, large amplitude (be aware of R shoulder and back pain)   Talbot Monarch, OTR/L 10/16/2022, 9:33 AM

## 2022-10-16 NOTE — Therapy (Signed)
OUTPATIENT PHYSICAL THERAPY NEURO TREATMENT   Patient Name: Johnny Fox MRN: [id number] DOB:[birth year], 89 y.o., male Today's Date: 10/19/2022   PCP: Emilio Aspen, MD  REFERRING PROVIDER: Vladimir Faster, DO  END OF SESSION:  PT End of Session - 10/19/22 1048     Visit Number 6    Number of Visits 13    Date for PT Re-Evaluation 11/11/22    Authorization Type Medicare/BCBS    PT Start Time 1018    PT Stop Time 1044    PT Time Calculation (min) 26 min    Equipment Utilized During Treatment Gait belt    Activity Tolerance Patient limited by pain    Behavior During Therapy WFL for tasks assessed/performed               Past Medical History:  Diagnosis Date   Actinic keratoses    and seborrheic keratoses   Arrhythmia    afib postoperatively after CABG in 2004, No recurrence   Coronary artery disease    Diabetes mellitus without complication (HCC)    diet controlled   Dyslipidemia    ED (erectile dysfunction)    Elevated PSA    2010, workup with Dr. Isabel Caprice   H/O prostate biopsy    Hypertension    Mild carotid artery disease (HCC)    Onychomycosis    Prostate cancer (HCC)    PSA's run approx 7- 2011-2013   Right shoulder injury    july 2014, Dr. Caryn Bee Supple   Tick bite of abdomen    2013, treated with 3wks of doxycycline, tick engorged 3.15mm nodule left thyroid 4/14- seen on carotid u/s   Past Surgical History:  Procedure Laterality Date   CARDIAC SURGERY     CATARACT EXTRACTION, BILATERAL     CORONARY ARTERY BYPASS GRAFT  2004   Patient Active Problem List   Diagnosis Date Noted   Bilateral impacted cerumen 10/09/2022   Sensorineural hearing loss, bilateral 10/09/2022   Aortic aneurysm (HCC) 03/26/2021   Abnormal gait 03/13/2020   Bronchitis 03/13/2020   Chin laceration 03/13/2020   Cough 03/13/2020   Cramp and spasm 03/13/2020   Disorder of penis 03/13/2020   Dizzy spells 03/13/2020   Dyspnea 03/13/2020   Edema of lower  extremity 03/13/2020   Elevated PSA 03/13/2020   Encounter for general adult medical examination without abnormal findings 03/13/2020   History of malignant neoplasm of prostate 03/13/2020   Low back pain 03/13/2020   Malignant tumor of prostate (HCC) 03/13/2020   Neuropathy 03/13/2020   Orthostatic hypotension 03/13/2020   Other long term (current) drug therapy 03/13/2020   Other specified abnormal findings of blood chemistry 03/13/2020   Other sprain of right hip, initial encounter 03/13/2020   Paresthesia 03/13/2020   Parkinson's disease (HCC) 03/13/2020   Paroxysmal atrial fibrillation (HCC) 03/13/2020   Polyneuropathy due to type 2 diabetes mellitus (HCC) 03/13/2020   Presence of aortocoronary bypass graft 03/13/2020   Sciatica, right side 03/13/2020   Shoulder joint pain 03/13/2020   Skin sensation disturbance 03/13/2020   Type 2 diabetes mellitus without complications (HCC) 03/13/2020   Upper respiratory infection 03/13/2020   Vitamin D deficiency 03/13/2020   Right leg numbness 01/14/2015   Thyroid nodule 01/14/2015   Coronary atherosclerosis of native coronary artery 02/15/2013   Essential hypertension, benign 02/15/2013   Pure hypercholesterolemia 02/15/2013   Fatigue 02/15/2013    ONSET DATE: months  REFERRING DIAG: G20.A1 (ICD-10-CM) - Parkinson's disease without dyskinesia or fluctuating manifestations  THERAPY DIAG:  Other symptoms and signs involving the nervous system  Other lack of coordination  Muscle weakness (generalized)  Unsteadiness on feet  Rationale for Evaluation and Treatment: Rehabilitation  SUBJECTIVE:                                                                                                                                                                                             SUBJECTIVE STATEMENT: Had an appointment with the PA about my back and the shot has not kicked in. Wife reports that patient was started on Prednisone. "The  massage felt really good last session."  Pt accompanied by: self  PERTINENT HISTORY: CAD, DM, HLD, HTN, prostate CA, CABG 2004  PAIN:  Are you having pain? Yes: NPRS scale: "close to 10"/10 Pain location: low back/lumbar Pain description: sharp, ache, sore Aggravating factors: movement Relieving factors: tylenol and salon pas  PRECAUTIONS: Fall  RED FLAGS: None   WEIGHT BEARING RESTRICTIONS: No  FALLS: Has patient fallen in last 6 months? Yes. Number of falls 1  LIVING ENVIRONMENT: Lives with: lives with their spouse Lives in: House/apartment Stairs:  no steps to enter; 2 steps to sunroom Has following equipment at home: Environmental consultant - 4 wheeled, shower chair, Grab bars, and cane  PLOF: Independent; retired   PATIENT GOALS: "improve on my ability to move around"  OBJECTIVE:    TODAY'S TREATMENT: 10/19/22 Activity Comments  STM and TPR to R QL, lumbar paraspinals TTP but good tolerance; use of tennis ball  Pelvic tilts Cues to avoid pushing into pain  STS 5x Cueing to scoot to edge of seat, tuck feet under; heavy use of UEs but but denied pain   MHP to R LBP Placed at end of PT session; applied through following OT session              PATIENT EDUCATION: Education details: discussion on pt's LBP, transfer training  Person educated: Patient and Spouse Education method: Explanation Education comprehension: verbalized understanding    Access Code: [email or web address] URL: [email or web address] Date: 10/07/2022 Prepared by: [facility] - Outpatient  Rehab - [facility]  Exercises - Seated Long Arc Quad  - 1 x daily - 5 x weekly - 2 sets - 10 reps - Seated March  - 1 x daily - 5 x weekly - 2 sets - 10 reps - Seated Ankle Dorsiflexion AROM  - 1 x daily - 5 x weekly - 2 sets - 10 reps - Supine Lower Trunk Rotation  - 1 x daily - 7 x weekly - 1-2 sets - 10 reps - Hooklying Single Knee to Chest  -  1 x daily - 7 x weekly - 1 sets - 3 reps - 15-30 sec  hold      --------------------------------------------------------------------  DIAGNOSTIC FINDINGS: none recent  COGNITION: Overall cognitive status: Within functional limits for tasks assessed   SENSATION: Reports baseline N/T in B feet d/t hx neuropathy   COORDINATION: Alternating pronation/supination: B WNL Alternating toe tap: B WNL Finger to nose: B WNL   MUSCLE TONE: significant rigidity in B LEs    POSTURE: rounded shoulders, forward head, and increased thoracic kyphosis  LOWER EXTREMITY ROM:     Active  Right Eval Left Eval  Hip flexion    Hip extension    Hip abduction    Hip adduction    Hip internal rotation    Hip external rotation    Knee flexion    Knee extension    Ankle dorsiflexion 8 5  Ankle plantarflexion    Ankle inversion    Ankle eversion     (Blank rows = not tested)  LOWER EXTREMITY MMT:    MMT (in sitting) Right Eval Left Eval  Hip flexion 4+ 4+  Hip extension    Hip abduction 4+ 4+  Hip adduction 4 4  Hip internal rotation    Hip external rotation    Knee flexion 4+ 4+  Knee extension 4+ 4+  Ankle dorsiflexion 4+ 4+  Ankle plantarflexion 4+ 4+  Ankle inversion    Ankle eversion    (Blank rows = not tested)   GAIT: Gait pattern: B short step length; occasional freezing Assistive device utilized: Single point cane Level of assistance: Modified independence and SBA   FUNCTIONAL TESTS:    *patient reported fatigue with testing today and required sit break in between tasks    TODAY'S TREATMENT:                                                                                                                              DATE: 09/30/22    PATIENT EDUCATION: Education details: prognosis, POC, benefits of ST Person educated: Patient Education method: Explanation, Demonstration, Tactile cues, and Verbal cues Education comprehension: verbalized understanding  HOME EXERCISE PROGRAM: Not initiated    GOALS: Goals  reviewed with patient? Yes  SHORT TERM GOALS: Target date: 10/21/2022  Patient to be independent with initial HEP. Baseline: HEP initiated Goal status: IN PROGRESS    LONG TERM GOALS: Target date: 11/11/2022  Patient to be independent with advanced HEP. Baseline: Not yet initiated  Goal status: IN PROGRESS  Patient to improve MiniBestTest score to atleast 17-21 to decrease risk of falls.  Baseline: not yet completed  Goal status: IN PROGRESS  Patient to report 50% improvement in balance confidence during [employer] classes.  Baseline: reports fear of falling  Goal status: IN PROGRESS  Patient to demonstrate 5xSTS test in <15 sec in order to decrease risk of falls.   Baseline: 23.41 sec pushing off knees   unable to stand  fully on several reps Goal status: IN PROGRESS  Patient to verbalize understanding of fall prevention in home environment information. Baseline: Not yet initiated Goal status: IN PROGRESS    ASSESSMENT:  CLINICAL IMPRESSION: Patient arrived to session with report of unchanged LBP. However was seen by his Ortho PA and prescribed Prednisone. Patient tolerated gentle STM to R LB which he reported some relief with. Able to tolerate very gentle lumbopelvic ROM and review of transfers to reduce LBP. This PT faxed over request for approval to use TENS to patient's LB d/t hx of prostate CA and have not received answer. Left voicemail at [facility], PA's office after pt's session to ask for approval d/t poor therapy tolerance and severe LBP.   OBJECTIVE IMPAIRMENTS: Abnormal gait, decreased activity tolerance, decreased balance, decreased endurance, decreased ROM, impaired flexibility, and impaired tone.   ACTIVITY LIMITATIONS: carrying, lifting, bending, sitting, standing, squatting, sleeping, stairs, transfers, bed mobility, bathing, toileting, dressing, reach over head, and hygiene/grooming  PARTICIPATION LIMITATIONS: meal prep, cleaning, laundry,  shopping, community activity, and church  PERSONAL FACTORS: Age, Past/current experiences, Time since onset of injury/illness/exacerbation, and 3+ comorbidities: CAD, DM, HLD, HTN, prostate CA, CABG 2004  are also affecting patient's functional outcome.   REHAB POTENTIAL: Good  CLINICAL DECISION MAKING: Evolving/moderate complexity  EVALUATION COMPLEXITY: Moderate  PLAN:  PT FREQUENCY: 2x/week  PT DURATION: 6 weeks  PLANNED INTERVENTIONS: Therapeutic exercises, Therapeutic activity, Neuromuscular re-education, Balance training, Gait training, Patient/Family education, Self Care, Joint mobilization, Stair training, Vestibular training, Canalith repositioning, DME instructions, Aquatic Therapy, Dry Needling, Electrical stimulation, Cryotherapy, Moist heat, Taping, Manual therapy, and Re-evaluation  PLAN FOR NEXT SESSION:  try TENS to LB once approval is received from Ortho PA or PCP; complete mini best test (as pain allows); initiate HEP for STS and static and dynamic balance.  Review initial HEP     Anette Guarneri, PT, DPT 10/19/22 10:57 AM  [facility] at [facility] [address] [place], [address] [place], [place] [address] Phone # [phone] Fax # [phone]

## 2022-10-16 NOTE — Telephone Encounter (Signed)
Hello,  I saw Johnny Fox in OPPT today. He has been c/o R sided LBP since 10/05/22. Seen at [facility] 10/14/22 for cortisone injection. He had c/o 10/10 pain at start of session today and was not able to tolerate usual activities. D/t hx of prostate CA, would you advise we use e-stim/TENS with him for relief? Please advise.  Thanks!  Anette Guarneri, PT, DPT 10/16/22 10:55 AM  [facility] at [facility] [address] [place], [address] [place], [place] [address] Phone # [phone] Fax # [phone]   ---------- Faxed to Eleanora Neighbor, MD and Andrez Grime, PA-C

## 2022-10-19 ENCOUNTER — Other Ambulatory Visit: Payer: Self-pay

## 2022-10-19 ENCOUNTER — Ambulatory Visit: Payer: Medicare Other | Admitting: Physical Therapy

## 2022-10-19 ENCOUNTER — Encounter: Payer: Self-pay | Admitting: Physical Therapy

## 2022-10-19 ENCOUNTER — Ambulatory Visit: Payer: Medicare Other | Admitting: Occupational Therapy

## 2022-10-19 ENCOUNTER — Ambulatory Visit: Payer: Medicare Other

## 2022-10-19 DIAGNOSIS — R29818 Other symptoms and signs involving the nervous system: Secondary | ICD-10-CM

## 2022-10-19 DIAGNOSIS — M6281 Muscle weakness (generalized): Secondary | ICD-10-CM | POA: Diagnosis not present

## 2022-10-19 DIAGNOSIS — R1312 Dysphagia, oropharyngeal phase: Secondary | ICD-10-CM | POA: Diagnosis not present

## 2022-10-19 DIAGNOSIS — R2681 Unsteadiness on feet: Secondary | ICD-10-CM | POA: Diagnosis not present

## 2022-10-19 DIAGNOSIS — R2689 Other abnormalities of gait and mobility: Secondary | ICD-10-CM | POA: Diagnosis not present

## 2022-10-19 DIAGNOSIS — R278 Other lack of coordination: Secondary | ICD-10-CM

## 2022-10-19 DIAGNOSIS — M5451 Vertebrogenic low back pain: Secondary | ICD-10-CM | POA: Diagnosis not present

## 2022-10-19 DIAGNOSIS — R41840 Attention and concentration deficit: Secondary | ICD-10-CM

## 2022-10-19 NOTE — Therapy (Signed)
OUTPATIENT OCCUPATIONAL THERAPY [facility] Treatment Note  Patient Name: Johnny Fox MRN: [id number] DOB:1928/12/19, 89 y.o., male Today's Date: 10/19/2022  PCP: Emilio Aspen, MD REFERRING PROVIDER: Vladimir Faster, DO  END OF SESSION:  OT End of Session - 10/19/22 1116     Visit Number 5    Number of Visits 13    Date for OT Re-Evaluation 11/13/22    Authorization Type Medicare A&B    OT Start Time 1050    OT Stop Time 1107    OT Time Calculation (min) 17 min    Activity Tolerance Patient limited by pain                Past Medical History:  Diagnosis Date   Actinic keratoses    and seborrheic keratoses   Arrhythmia    afib postoperatively after CABG in 2004, No recurrence   Coronary artery disease    Diabetes mellitus without complication (HCC)    diet controlled   Dyslipidemia    ED (erectile dysfunction)    Elevated PSA    2010, workup with Dr. Isabel Caprice   H/O prostate biopsy    Hypertension    Mild carotid artery disease (HCC)    Onychomycosis    Prostate cancer (HCC)    PSA's run approx 7- 2011-2013   Right shoulder injury    july 2014, Dr. Caryn Bee Supple   Tick bite of abdomen    2013, treated with 3wks of doxycycline, tick engorged 3.40mm nodule left thyroid 4/14- seen on carotid u/s   Past Surgical History:  Procedure Laterality Date   CARDIAC SURGERY     CATARACT EXTRACTION, BILATERAL     CORONARY ARTERY BYPASS GRAFT  2004   Patient Active Problem List   Diagnosis Date Noted   Bilateral impacted cerumen 10/09/2022   Sensorineural hearing loss, bilateral 10/09/2022   Aortic aneurysm (HCC) 03/26/2021   Abnormal gait 03/13/2020   Bronchitis 03/13/2020   Chin laceration 03/13/2020   Cough 03/13/2020   Cramp and spasm 03/13/2020   Disorder of penis 03/13/2020   Dizzy spells 03/13/2020   Dyspnea 03/13/2020   Edema of lower extremity 03/13/2020   Elevated PSA 03/13/2020   Encounter for general adult medical examination without  abnormal findings 03/13/2020   History of malignant neoplasm of prostate 03/13/2020   Low back pain 03/13/2020   Malignant tumor of prostate (HCC) 03/13/2020   Neuropathy 03/13/2020   Orthostatic hypotension 03/13/2020   Other long term (current) drug therapy 03/13/2020   Other specified abnormal findings of blood chemistry 03/13/2020   Other sprain of right hip, initial encounter 03/13/2020   Paresthesia 03/13/2020   Parkinson's disease (HCC) 03/13/2020   Paroxysmal atrial fibrillation (HCC) 03/13/2020   Polyneuropathy due to type 2 diabetes mellitus (HCC) 03/13/2020   Presence of aortocoronary bypass graft 03/13/2020   Sciatica, right side 03/13/2020   Shoulder joint pain 03/13/2020   Skin sensation disturbance 03/13/2020   Type 2 diabetes mellitus without complications (HCC) 03/13/2020   Upper respiratory infection 03/13/2020   Vitamin D deficiency 03/13/2020   Right leg numbness 01/14/2015   Thyroid nodule 01/14/2015   Coronary atherosclerosis of native coronary artery 02/15/2013   Essential hypertension, benign 02/15/2013   Pure hypercholesterolemia 02/15/2013   Fatigue 02/15/2013    ONSET DATE: referral 09/17/22 (PD diagnosis May 2021)  REFERRING DIAG: G20.A1 (ICD-10-CM) - Parkinson's disease without dyskinesia or fluctuating manifestations  THERAPY DIAG:  Other symptoms and signs involving the nervous system  Attention and  concentration deficit  Rationale for Evaluation and Treatment: Rehabilitation  SUBJECTIVE:   SUBJECTIVE STATEMENT: Pt's spouse reports that he went to the orthopedic PA this morning who prescribed a steroid taper.   Pt accompanied by: self and family member (spouse remained in waiting room)  PERTINENT HISTORY: ET/PD diagnosed approx May 2021; PMH includes repeated falls, CAD, HTN, afib s/p CABG in 2004, hx of prostate cancer, DMT2, and chronic R shoulder pain, hearing loss  PRECAUTIONS: Fall  WEIGHT BEARING RESTRICTIONS: No  PAIN:  Are you  having pain? Yes: NPRS scale: 10/10 Pain location: back Pain description: sharp, shooting Aggravating factors: positional Relieving factors: position, medical patch  FALLS: Has patient fallen in last 6 months? Yes. Number of falls 1, pt reports having one fall (backwards) walking through family room, but does not feel like he tripped   LIVING ENVIRONMENT: Lives with: lives with their spouse Lives in: House/apartment Stairs:  level entry in the back; a few steps to enter the front; 2 steps inside from family room to sun room Has following equipment at home: Single point cane, Environmental consultant - 4 wheeled, Shower bench (built in), bed side commode, and Grab bars  PLOF: Requires assistive device for independence  PATIENT GOALS: improve on my balane  OBJECTIVE:   HAND DOMINANCE: Right  ADLs: Overall ADLs: Increased time Transfers/ambulation related to ADLs: Using [facility] for mobility in home and in community, using RW at home as well Eating: decreased grip strength to open containers, reports some recent difficulty with swallowing Grooming: some difficulty with combing hair due to "bad shoulder" on R from fall many years ago UB Dressing: recently increased difficulty with buttons (does typically wear collared shirts with buttons) LB Dressing: sometimes needs assistance with tying L shoe Toileting: Mod I Bathing: Mod I Tub Shower transfers: Mod I Equipment: Grab bars, Walk in shower, and bed side commode  IADLs: Light housekeeping: washes dishes, help with making the bed every morning Meal Prep: spouse does all the cooking Community mobility: no longer driving Medication management: wife fills pill box and reorders as needed Handwriting: 90% legible, Mild micrographia, and 12.47 sec on PPT #1 (whales live in a [place].)  MOBILITY STATUS: Hx of falls  POSTURE COMMENTS:   rounded shoulders, forward head, and increased thoracic kyphosis  ACTIVITY TOLERANCE: Activity tolerance: WFL for  tasks assessed on eval  FUNCTIONAL OUTCOME MEASURES: Standing functional reach: Right: 5.25 inches; Left: 5 inches Fastening/unfastening 3 buttons: 1:13.28 Physical performance test: PPT#2 (simulated eating) 20.19 sec (noted mild tremor when scooping) & PPT#4 (donning/doffing jacket): 20.95 sec  COORDINATION: Finger Nose Finger test: Mild B tremors, good speed 9 Hole Peg test: Right: 33.78 sec; Left: 38.41 sec Box and Blocks:  Right 43 blocks, Left 40 blocks Tremors: action and Right  UE ROM:   R shoulder limited due to pain, shoulder flexion grossly 130, able to achieve internal and external rotation with reports of mild pain  UE MMT:  R shoulder limited due to pain (grossly 3+/5); LUE WFL (grossly 4+5), B elbows grossly 4+/5   Grip strength: R: 38# and L: 45#  SENSATION: WFL  COGNITION: Overall cognitive status: Within functional limits for tasks assessed; pt and spouse report some trouble with memory but wife notes his is not worse than hers. He has mostly trouble with names.  OBSERVATIONS: Bradykinesia   TODAY'S TREATMENT:  DATE:  10/19/22 Med management: educated on techniques to aid in memory/recall with medications.  OT provided information on Pill Map use to organize and double check meds, especially when filling pill box.  OT educated on use of grid for steroid taper with progressively reduced grid each day.  Also recommended filling med cup with amount per day to allow for double check with grid. Provided with handouts of each.   10/16/22 Coordination: completed with LUE and RUE with focus on quality and amplitude as appropriate rotating 2 balls in hand clockwise and counter clockwise, transitioned to rotating 2 large dice in hands to correctly create given number.  Also incorporating cognitive challenge, pt able to correctly identify factors but with difficulty with recall when rotating dice  to locate named numbers.   Flipping and rotating coins.  Pt initially flipping coins, utilizing table top as assist therefore transitioned to rotating to further challenge coordination and motor control. Pt with difficulty on L side, utilizing more translation due to decreased control with finger tips. In-hand manipulation with coins. picking up coins 1 at a time and translating palm to fingertips to place into a bowl incorporating small trunk rotation to reach across midline.  Increased challenge to placing into coin slot in same manner.  Pt demonstrating improvements in motor control and translation of coins to finger tips and placing into coin slot.  Pt reports increased pain in back even with slight anterior weight shift and reaching across midline. Buttons: engaged in massed practice with buttoning and unbuttoning.  Then timed with pt completing in 1:26.87, demonstrating increased difficulty with managing buttons as he fatigued.     10/12/22 Coordination: engaged in small peg board pattern replication with RUE and then LUE with focus on coordination and quality of picking up pegs.  OT providing verbal cues to utilize hand flicks as needed for coordination and large amplitude movements as needed.  Pt completing with min drops with RUE, no drops with LUE. Large amplitude: completed large amplitude "finger flicks" with finger extension and forearm extension.  Pt completed x10 with min cues for quality of movement. Buttons: engaged in buttoning/unbuttoning buttons with focus on grasp/release pattern and quality of movement.  Pt still requiring increased time, but demonstrating improved quality of movement.  Completed 3 button/unbutton in 40.94 sec! Cup stacking: stacking cups in 10 frame pyramid form to facilitate increased functional and sustained grasp on cups.  Pt demonstrating improved coordination and control when picking up, stacking, and unstacking cups with RUE and LUE, dropping x1 bilaterally when  stacking.  OT providing cues to focus on full hand placement on cup to increase grasp and motor control.  Increased challenge to alternating hands with stacking to challenge alternating motor control, no drops.   PATIENT EDUCATION: Education details: ongoing condition specific education Person educated: Patient Education method: Explanation Education comprehension: verbalized understanding and needs further education  HOME EXERCISE PROGRAM: Coordination handout - see pt instructions  GOALS: Goals reviewed with patient? Yes  SHORT TERM GOALS: Target date: 10/23/22  Pt will be independent with PD specific HEP Baseline: Goal status: IN PROGRESS  2.  Pt will verbalize understanding of strategies to decrease risk of further complication related to PD, including corresponding energy conservation strategies to improve participation in IADLs  Baseline:  Goal status: IN PROGRESS  3.  Pt will demonstrate increased ease w/ manipulation of clothing fasteners by decreasing time to complete 3 buttons to less than 60 seconds. Baseline: 1:13.28 Goal status: IN PROGRESS  LONG TERM GOALS: Target date: 11/13/22  Pt will improve participation in self-feeding as evidenced by decreasing time to move 5 beans from a bowl w/ a spoon, incorporating compensatory strategies/AE prn  Baseline: 20.19 sec  Goal status: IN PROGRESS  2.  Pt will demonstrate increased ease w/ manipulation of clothing fasteners by decreasing time to complete 3 buttons to less than 50 seconds. Baseline: 1:13.28 Goal status: IN PROGRESS  3.  Pt will demonstrate increased ease with dressing as evidenced by decreasing PPT#4(don/ doff jacket) to 15 secs or less Baseline: 20.95 sec Goal status: IN PROGRESS  4.  Pt will demonstrate improved dynamic standing balance and endurance as needed to complete grooming and/or housekeeping tasks with improved ease. Baseline: reports pain in lower back with prolonged standing. Goal status:  IN PROGRESS  ASSESSMENT:  CLINICAL IMPRESSION: Due to persistent extreme pain, pt and spouse requesting shortened session today.  Session focused on options for improved recall with med management, particularly with new steroid taper.  Pt and spouse pleased with recommendations and plan to implement grid for current steroid and to look further into Pill Map or other style for regular scheduled meds.  OT reviewed recommendation to call and cancel if pt continues to have such limitations due to back pain, as quality of participation in quite limited at this time.   PERFORMANCE DEFICITS: in functional skills including ADLs, IADLs, coordination, ROM, strength, pain, flexibility, Fine motor control, Gross motor control, balance, body mechanics, endurance, decreased knowledge of precautions, decreased knowledge of use of DME, and UE functional use, cognitive skills including memory, and psychosocial skills including coping strategies and environmental adaptation.   IMPAIRMENTS: are limiting patient from ADLs, IADLs, and rest and sleep.   COMORBIDITIES:  may have co-morbidities  that affects occupational performance. Patient will benefit from skilled OT to address above impairments and improve overall function.  MODIFICATION OR ASSISTANCE TO COMPLETE EVALUATION: Min-Moderate modification of tasks or assist with assess necessary to complete an evaluation.  OT OCCUPATIONAL PROFILE AND HISTORY: Detailed assessment: Review of records and additional review of physical, cognitive, psychosocial history related to current functional performance.  CLINICAL DECISION MAKING: Moderate - several treatment options, min-mod task modification necessary  REHAB POTENTIAL: Good  EVALUATION COMPLEXITY: Moderate    PLAN:  OT FREQUENCY: 2x/week  OT DURATION: 6 weeks  PLANNED INTERVENTIONS: self care/ADL training, therapeutic exercise, therapeutic activity, neuromuscular re-education, manual therapy, passive range  of motion, balance training, functional mobility training, ultrasound, compression bandaging, moist heat, cryotherapy, patient/family education, cognitive remediation/compensation, psychosocial skills training, energy conservation, coping strategies training, and DME and/or AE instructions  RECOMMENDED OTHER SERVICES: NA  CONSULTED AND AGREED WITH PLAN OF CARE: Patient  PLAN FOR NEXT SESSION: Review coordination HEP, initiate shoulder ROM and stretches, large amplitude (be aware of R shoulder and back pain).  Review meds (pill map and grid).   Rosalio Loud, OTR/L 10/19/2022, 11:17 AM

## 2022-10-19 NOTE — Therapy (Signed)
OUTPATIENT SPEECH LANGUAGE PATHOLOGY SWALLOW EVALUATION   Patient Name: Johnny Fox MRN: [id number] DOB:03/24/1928, 89 y.o., male Today's Date: 10/19/2022  PCP: Eleanora Neighbor, MD REFERRING PROVIDER: Tat, Octaviano Batty, DO  END OF SESSION:  End of Session - 10/19/22 1103     Visit Number 1    Number of Visits 6    Date for SLP Re-Evaluation 11/27/22    SLP Start Time 0940   pt 7 minutes late   SLP Stop Time  1015    SLP Time Calculation (min) 35 min    Activity Tolerance Patient limited by pain             Past Medical History:  Diagnosis Date   Actinic keratoses    and seborrheic keratoses   Arrhythmia    afib postoperatively after CABG in 2004, No recurrence   Coronary artery disease    Diabetes mellitus without complication (HCC)    diet controlled   Dyslipidemia    ED (erectile dysfunction)    Elevated PSA    2010, workup with Dr. Isabel Caprice   H/O prostate biopsy    Hypertension    Mild carotid artery disease (HCC)    Onychomycosis    Prostate cancer (HCC)    PSA's run approx 7- 2011-2013   Right shoulder injury    july 2014, Dr. Caryn Bee Supple   Tick bite of abdomen    2013, treated with 3wks of doxycycline, tick engorged 3.76mm nodule left thyroid 4/14- seen on carotid u/s   Past Surgical History:  Procedure Laterality Date   CARDIAC SURGERY     CATARACT EXTRACTION, BILATERAL     CORONARY ARTERY BYPASS GRAFT  2004   Patient Active Problem List   Diagnosis Date Noted   Bilateral impacted cerumen 10/09/2022   Sensorineural hearing loss, bilateral 10/09/2022   Aortic aneurysm (HCC) 03/26/2021   Abnormal gait 03/13/2020   Bronchitis 03/13/2020   Chin laceration 03/13/2020   Cough 03/13/2020   Cramp and spasm 03/13/2020   Disorder of penis 03/13/2020   Dizzy spells 03/13/2020   Dyspnea 03/13/2020   Edema of lower extremity 03/13/2020   Elevated PSA 03/13/2020   Encounter for general adult medical examination without abnormal findings  03/13/2020   History of malignant neoplasm of prostate 03/13/2020   Low back pain 03/13/2020   Malignant tumor of prostate (HCC) 03/13/2020   Neuropathy 03/13/2020   Orthostatic hypotension 03/13/2020   Other long term (current) drug therapy 03/13/2020   Other specified abnormal findings of blood chemistry 03/13/2020   Other sprain of right hip, initial encounter 03/13/2020   Paresthesia 03/13/2020   Parkinson's disease (HCC) 03/13/2020   Paroxysmal atrial fibrillation (HCC) 03/13/2020   Polyneuropathy due to type 2 diabetes mellitus (HCC) 03/13/2020   Presence of aortocoronary bypass graft 03/13/2020   Sciatica, right side 03/13/2020   Shoulder joint pain 03/13/2020   Skin sensation disturbance 03/13/2020   Type 2 diabetes mellitus without complications (HCC) 03/13/2020   Upper respiratory infection 03/13/2020   Vitamin D deficiency 03/13/2020   Right leg numbness 01/14/2015   Thyroid nodule 01/14/2015   Coronary atherosclerosis of native coronary artery 02/15/2013   Essential hypertension, benign 02/15/2013   Pure hypercholesterolemia 02/15/2013   Fatigue 02/15/2013    ONSET DATE: "About three years ago" - script dated 09/30/22  REFERRING DIAG:  R13.19 (ICD-10-CM) - Dysphagia, neurologic    THERAPY DIAG:  Dysphagia, oropharyngeal phase  Rationale for Evaluation and Treatment: Rehabilitation  SUBJECTIVE:   SUBJECTIVE STATEMENT: "  Do you think this has to do with my Parkinson's?" Pt accompanied by: self - wife Johnny Fox in lobby  PERTINENT HISTORY: CAD, DM, HLD, HTN, prostate CA, CABG 2004.  PAIN:  Are you having pain? Yes: NPRS scale: 9/10 Pain location: back  Pain description: "really getting up there" Aggravating factors: IDK Relieving factors: "they're giving me steroid pills now - we'll see"  FALLS: Has patient fallen in last 6 months?  See PT evaluation for details  LIVING ENVIRONMENT: Lives with: lives with their spouse Lives in: House/apartment  PLOF:   Level of assistance: Independent with ADLs, Independent with IADLs, Needed assistance with ADLs, Needed assistance with IADLS Employment: Retired  PATIENT GOALS: "See what's wrong with this, if anything."  OBJECTIVE:  Note: Objective measures were completed at Evaluation unless otherwise noted. OBJECTIVE:   DIAGNOSTIC FINDINGS:  EXAM: CHEST - 2 VIEW   COMPARISON:  02/02/2021   FINDINGS: Cardiac shadow is enlarged but stable. Postsurgical changes are noted. Aortic calcifications are again seen. Lungs are clear. No bony abnormality is noted.   IMPRESSION: No active cardiopulmonary disease.   Electronically Signed   By: Alcide Clever M.D.   On: 07/05/2022 20:02  COGNITION: Overall cognitive status: Impaired Areas of impairment:  Memory: Impaired: Short term Functional deficits: Pt states he needs to ask Johnny Fox (wife) about appointments, wife is starting to take over medication administration due to more difficulty with memory.  SUBJECTIVE DYSPHAGIA REPORTS:  Date of onset: Early 2024 Reported symptoms: globus sensation with drier foods, primarily. Transit of meds reportedly WNL.  Current diet: regular and thin liquids  Co-morbid voice changes: No  FACTORS WHICH MAY INCREASE RISK OF ADVERSE EVENT IN PRESENCE OF ASPIRATION:  General health: frail or deconditioned  Risk factors: reduced cognitive function    ORAL MOTOR EXAMINATION: Overall status: Impaired: Labial: Bilateral (ROM and Coordination) Lingual: Bilateral (ROM and Coordination) Facial: Bilateral (ROM) Comments: Lingual and labial ROM better with visual model.  CLINICAL SWALLOW ASSESSMENT:   Dentition: missing dentition  (upper molars, bil) Vocal quality at baseline: normal; Vocal intensity 70dB average. Pt states he gets through conversations without decrease in intensity. Remained  Patient directly observed with POs: Yes: regular, dysphagia 3 (soft), and thin liquids  Feeding: able to feed self Liquids  provided by: cup [facility] Swallow Protocol:  not administered Oral phase signs and symptoms: prolonged mastication; suspect missing bil molars are basis for prolonged mastication Pharyngeal phase signs and symptoms: multiple swallows, audible swallow, and wet vocal quality; Consistent multiple swallows, and mild hydrophonic voice (occasionally) with peanut butter crackers, consistent audible swallow with water sips when not paired with drier food (crackers)   PATIENT REPORTED OUTCOME MEASURES (PROM): EAT-10: administered in first 1-2 sessions   TODAY'S TREATMENT:  DATE:  10/19/22 (eval): n/a  PATIENT EDUCATION: Education details: details of MBS, results of evaluation today, pt will return for sessions after MBS Person educated: Patient and Spouse Education method: Explanation Education comprehension: verbalized understanding and needs further education   ASSESSMENT:  CLINICAL IMPRESSION: Patient is a 89 y.o. M who was seen today for assessment of swallowing (bedside clinical assessment). Swallowing liquids alone resulted in audible swallow, consistently. Cracker (peanut butter crackers) resulted in prolonged mastication and need for liquid wash in order to clear from pharynx. Pt also exhibited occasional hydrophonic voice after bites of crackers. MBS warranted - SLP to send order to referring MD to sign, if agreed with this plan (LTG #5).   OBJECTIVE IMPAIRMENTS: include memory and dysphagia. These impairments are limiting patient from managing appointments, household responsibilities, ADLs/IADLs, effectively communicating at home and in community, and safety when swallowing. Factors affecting potential to achieve goals and functional outcome are ability to learn/carryover information and pain level. Patient will benefit from skilled SLP services to address  above impairments and improve overall function.  REHAB POTENTIAL: Good   GOALS: Goals reviewed with patient? No   LONG TERM GOALS: (STGs deferred due to 5 week plan of care)  Target date: 11/27/22  Pt will follow precautions from MBS with rare min A in 2 sessions Baseline:  Goal status: INITIAL  2.  Pt will complete HEP derived from results from MBS with occasional min A, in two sessions Baseline:  Goal status: INITIAL  3.  Pt and/or wife will provide SLP with three overt s/sx aspiration PNA with modified independence Baseline:  Goal status: INITIAL  4.  Pt's PROM score will improve compared to initial administration Baseline:  Goal status: INITIAL  5.  Pt will undergo MBS to ascertain current swallow function, ID any weak swallowing musculature, and ID any positional modifications that could decr risk of aspiration  Baseline:  Goal status: INITIAL   PLAN:  SLP FREQUENCY: 1x/week  SLP DURATION:  5 sessions  PLANNED INTERVENTIONS: Aspiration precaution training, Pharyngeal strengthening exercises, Diet toleration management , Environmental controls, Trials of upgraded texture/liquids, Internal/external aids, Oral motor exercises, SLP instruction and feedback, Compensatory strategies, and Patient/family education    [facility], CCC-SLP 10/19/2022, 11:04 AM

## 2022-10-21 ENCOUNTER — Ambulatory Visit: Payer: Medicare Other | Admitting: Occupational Therapy

## 2022-10-21 ENCOUNTER — Ambulatory Visit: Payer: Medicare Other | Admitting: Physical Therapy

## 2022-10-22 ENCOUNTER — Other Ambulatory Visit (HOSPITAL_COMMUNITY): Payer: Self-pay | Admitting: *Deleted

## 2022-10-22 ENCOUNTER — Telehealth (HOSPITAL_COMMUNITY): Payer: Self-pay | Admitting: *Deleted

## 2022-10-22 DIAGNOSIS — R1310 Dysphagia, unspecified: Secondary | ICD-10-CM

## 2022-10-22 DIAGNOSIS — R059 Cough, unspecified: Secondary | ICD-10-CM

## 2022-10-22 NOTE — Telephone Encounter (Signed)
Attempted to contact patient to schedule OP MBS. Left VM. RKEEL

## 2022-10-23 NOTE — Therapy (Signed)
OUTPATIENT PHYSICAL THERAPY NEURO TREATMENT   Patient Name: Johnny Fox MRN: [id number] DOB:March 11, 1928, 89 y.o., male Today's Date: 10/26/2022   PCP: Emilio Aspen, MD  REFERRING PROVIDER: Vladimir Faster, DO  END OF SESSION:  PT End of Session - 10/26/22 1022     Visit Number 7    Number of Visits 13    Date for PT Re-Evaluation 11/11/22    Authorization Type Medicare/BCBS    PT Start Time 0931    PT Stop Time 1017    PT Time Calculation (min) 46 min    Equipment Utilized During Treatment Gait belt    Activity Tolerance Patient limited by pain;Patient tolerated treatment well    Behavior During Therapy [facility] for tasks assessed/performed                Past Medical History:  Diagnosis Date   Actinic keratoses    and seborrheic keratoses   Arrhythmia    afib postoperatively after CABG in 2004, No recurrence   Coronary artery disease    Diabetes mellitus without complication (HCC)    diet controlled   Dyslipidemia    ED (erectile dysfunction)    Elevated PSA    2010, workup with Dr. Isabel Caprice   H/O prostate biopsy    Hypertension    Mild carotid artery disease (HCC)    Onychomycosis    Prostate cancer (HCC)    PSA's run approx 7- 2011-2013   Right shoulder injury    july 2014, Dr. Caryn Bee Supple   Tick bite of abdomen    2013, treated with 3wks of doxycycline, tick engorged 3.24mm nodule left thyroid 4/14- seen on carotid u/s   Past Surgical History:  Procedure Laterality Date   CARDIAC SURGERY     CATARACT EXTRACTION, BILATERAL     CORONARY ARTERY BYPASS GRAFT  2004   Patient Active Problem List   Diagnosis Date Noted   Bilateral impacted cerumen 10/09/2022   Sensorineural hearing loss, bilateral 10/09/2022   Aortic aneurysm (HCC) 03/26/2021   Abnormal gait 03/13/2020   Bronchitis 03/13/2020   Chin laceration 03/13/2020   Cough 03/13/2020   Cramp and spasm 03/13/2020   Disorder of penis 03/13/2020   Dizzy spells 03/13/2020   Dyspnea  03/13/2020   Edema of lower extremity 03/13/2020   Elevated PSA 03/13/2020   Encounter for general adult medical examination without abnormal findings 03/13/2020   History of malignant neoplasm of prostate 03/13/2020   Low back pain 03/13/2020   Malignant tumor of prostate (HCC) 03/13/2020   Neuropathy 03/13/2020   Orthostatic hypotension 03/13/2020   Other long term (current) drug therapy 03/13/2020   Other specified abnormal findings of blood chemistry 03/13/2020   Other sprain of right hip, initial encounter 03/13/2020   Paresthesia 03/13/2020   Parkinson's disease (HCC) 03/13/2020   Paroxysmal atrial fibrillation (HCC) 03/13/2020   Polyneuropathy due to type 2 diabetes mellitus (HCC) 03/13/2020   Presence of aortocoronary bypass graft 03/13/2020   Sciatica, right side 03/13/2020   Shoulder joint pain 03/13/2020   Skin sensation disturbance 03/13/2020   Type 2 diabetes mellitus without complications (HCC) 03/13/2020   Upper respiratory infection 03/13/2020   Vitamin D deficiency 03/13/2020   Right leg numbness 01/14/2015   Thyroid nodule 01/14/2015   Coronary atherosclerosis of native coronary artery 02/15/2013   Essential hypertension, benign 02/15/2013   Pure hypercholesterolemia 02/15/2013   Fatigue 02/15/2013    ONSET DATE: months  REFERRING DIAG: G20.A1 (ICD-10-CM) - Parkinson's disease without dyskinesia  or fluctuating manifestations  THERAPY DIAG:  Other symptoms and signs involving the nervous system  Other lack of coordination  Muscle weakness (generalized)  Unsteadiness on feet  Rationale for Evaluation and Treatment: Rehabilitation  SUBJECTIVE:                                                                                                                                                                                             SUBJECTIVE STATEMENT: Daughter reports that pt had a fall into the bed since last session- did not fall to the floor. Pt  reports pain is better- not gone. Able to get up better than before. Using 5ID today.   Pt accompanied by: self, daughter. wife  PERTINENT HISTORY: CAD, DM, HLD, HTN, prostate CA, CABG 2004  PAIN:  Are you having pain? Yes: NPRS scale: "6"/10 Pain location: low back/lumbar Pain description: sharp, ache, sore Aggravating factors: movement Relieving factors: tylenol and salon pas  PRECAUTIONS: Fall  RED FLAGS: None   WEIGHT BEARING RESTRICTIONS: No  FALLS: Has patient fallen in last 6 months? Yes. Number of falls 1  LIVING ENVIRONMENT: Lives with: lives with their spouse Lives in: House/apartment Stairs:  no steps to enter; 2 steps to sunroom Has following equipment at home: Environmental consultant - 4 wheeled, shower chair, Grab bars, and cane  PLOF: Independent; retired   PATIENT GOALS: "improve on my ability to move around"  OBJECTIVE:     TODAY'S TREATMENT: 10/26/22 Activity Comments  Gait and transfer training with 4WW and RW, cane, no AD 4x140 ft Cues to "lock the brakes down", avoid reaching for items to stand; some trouble navigating around tight spaces; pt prefers 4WW or cane  TENS to LB with MHP  10 min; amplitude to tolerance; pt reports no improvement in pain immediately after                PATIENT EDUCATION: Education details: edu on ok from PA to try TENS, edu on TENS benefits, precautions, electrode placement, encouraged consistent HEP compliance and walking inside the house, as well as gait with cane for short distances inside the house, 4WW for longer distances  Person educated: Patient, Spouse, and Child(ren) Education method: Explanation, Demonstration, Tactile cues, Verbal cues, and Handouts Education comprehension: verbalized understanding and returned demonstration    Access Code: [email or web address] URL: [email or web address] Date: 10/07/2022 Prepared by: [facility] - Outpatient  Rehab - [facility]  Exercises - Seated Long Arc Quad  - 1 x daily  - 5 x weekly - 2 sets - 10 reps - Seated March  - 1 x daily - 5 x weekly - 2  sets - 10 reps - Seated Ankle Dorsiflexion AROM  - 1 x daily - 5 x weekly - 2 sets - 10 reps - Supine Lower Trunk Rotation  - 1 x daily - 7 x weekly - 1-2 sets - 10 reps - Hooklying Single Knee to Chest  - 1 x daily - 7 x weekly - 1 sets - 3 reps - 15-30 sec hold      --------------------------------------------------------------------  DIAGNOSTIC FINDINGS: none recent  COGNITION: Overall cognitive status: Within functional limits for tasks assessed   SENSATION: Reports baseline N/T in B feet d/t hx neuropathy   COORDINATION: Alternating pronation/supination: B WNL Alternating toe tap: B WNL Finger to nose: B WNL   MUSCLE TONE: significant rigidity in B LEs    POSTURE: rounded shoulders, forward head, and increased thoracic kyphosis  LOWER EXTREMITY ROM:     Active  Right Eval Left Eval  Hip flexion    Hip extension    Hip abduction    Hip adduction    Hip internal rotation    Hip external rotation    Knee flexion    Knee extension    Ankle dorsiflexion 8 5  Ankle plantarflexion    Ankle inversion    Ankle eversion     (Blank rows = not tested)  LOWER EXTREMITY MMT:    MMT (in sitting) Right Eval Left Eval  Hip flexion 4+ 4+  Hip extension    Hip abduction 4+ 4+  Hip adduction 4 4  Hip internal rotation    Hip external rotation    Knee flexion 4+ 4+  Knee extension 4+ 4+  Ankle dorsiflexion 4+ 4+  Ankle plantarflexion 4+ 4+  Ankle inversion    Ankle eversion    (Blank rows = not tested)   GAIT: Gait pattern: B short step length; occasional freezing Assistive device utilized: Single point cane Level of assistance: Modified independence and SBA   FUNCTIONAL TESTS:    *patient reported fatigue with testing today and required sit break in between tasks    TODAY'S TREATMENT:                                                                                                                               DATE: 09/30/22    PATIENT EDUCATION: Education details: prognosis, POC, benefits of ST Person educated: Patient Education method: Explanation, Demonstration, Tactile cues, and Verbal cues Education comprehension: verbalized understanding  HOME EXERCISE PROGRAM: Not initiated    GOALS: Goals reviewed with patient? Yes  SHORT TERM GOALS: Target date: 10/21/2022  Patient to be independent with initial HEP. Baseline: HEP initiated Goal status: IN PROGRESS    LONG TERM GOALS: Target date: 11/11/2022  Patient to be independent with advanced HEP. Baseline: Not yet initiated  Goal status: IN PROGRESS  Patient to improve MiniBestTest score to atleast 17-21 to decrease risk of falls.  Baseline: not yet completed  Goal status: IN PROGRESS  Patient  to report 50% improvement in balance confidence during [employer].  Baseline: reports fear of falling  Goal status: IN PROGRESS  Patient to demonstrate 5xSTS test in <15 sec in order to decrease risk of falls.   Baseline: 23.41 sec pushing off knees   unable to stand fully on several reps Goal status: IN PROGRESS  Patient to verbalize understanding of fall prevention in home environment information. Baseline: Not yet initiated Goal status: IN PROGRESS    ASSESSMENT:  CLINICAL IMPRESSION: Received return fax from [place], [place] stating patient "ok for stim."   Patient arrived to session with daughter and wife, ambulating with 4WW today. Daughter mentions that pt had a fall since last session and R side of face slightly bruised but pt and family unsure of cause. Reports improvement in LBP since last session but not resolved. Educated patient and family on safe use of 4WW with gait and transfers; also trialed other AD's with best stability observed with 4WW and cane today. Trialed TENS to LB which patient tolerated well but reported no immediate relief. Patient and family reported  understanding of all edu provided today and all questions were answered. No complaints upon leaving.   OBJECTIVE IMPAIRMENTS: Abnormal gait, decreased activity tolerance, decreased balance, decreased endurance, decreased ROM, impaired flexibility, and impaired tone.   ACTIVITY LIMITATIONS: carrying, lifting, bending, sitting, standing, squatting, sleeping, stairs, transfers, bed mobility, bathing, toileting, dressing, reach over head, and hygiene/grooming  PARTICIPATION LIMITATIONS: meal prep, cleaning, laundry, shopping, community activity, and church  PERSONAL FACTORS: Age, Past/current experiences, Time since onset of injury/illness/exacerbation, and 3+ comorbidities: CAD, DM, HLD, HTN, prostate CA, CABG 2004  are also affecting patient's functional outcome.   REHAB POTENTIAL: Good  CLINICAL DECISION MAKING: Evolving/moderate complexity  EVALUATION COMPLEXITY: Moderate  PLAN:  PT FREQUENCY: 2x/week  PT DURATION: 6 weeks  PLANNED INTERVENTIONS: Therapeutic exercises, Therapeutic activity, Neuromuscular re-education, Balance training, Gait training, Patient/Family education, Self Care, Joint mobilization, Stair training, Vestibular training, Canalith repositioning, DME instructions, Aquatic Therapy, Dry Needling, Electrical stimulation, Cryotherapy, Moist heat, Taping, Manual therapy, and Re-evaluation  PLAN FOR NEXT SESSION:  complete mini best test (as pain allows); initiate HEP for STS and static and dynamic balance.  Review initial HEP     Anette Guarneri, PT, DPT 10/26/22 10:23 AM  [facility] at [facility] [address] [place], [address] [place], [place] [address] Phone # [phone] Fax # [phone]

## 2022-10-24 DIAGNOSIS — Z23 Encounter for immunization: Secondary | ICD-10-CM | POA: Diagnosis not present

## 2022-10-26 ENCOUNTER — Ambulatory Visit: Payer: Medicare Other | Admitting: Occupational Therapy

## 2022-10-26 ENCOUNTER — Encounter: Payer: Self-pay | Admitting: Physical Therapy

## 2022-10-26 ENCOUNTER — Telehealth: Payer: Self-pay | Admitting: Neurology

## 2022-10-26 ENCOUNTER — Ambulatory Visit: Payer: Medicare Other | Admitting: Physical Therapy

## 2022-10-26 DIAGNOSIS — R2681 Unsteadiness on feet: Secondary | ICD-10-CM

## 2022-10-26 DIAGNOSIS — M6281 Muscle weakness (generalized): Secondary | ICD-10-CM | POA: Diagnosis not present

## 2022-10-26 DIAGNOSIS — R2689 Other abnormalities of gait and mobility: Secondary | ICD-10-CM | POA: Diagnosis not present

## 2022-10-26 DIAGNOSIS — R278 Other lack of coordination: Secondary | ICD-10-CM

## 2022-10-26 DIAGNOSIS — R29818 Other symptoms and signs involving the nervous system: Secondary | ICD-10-CM

## 2022-10-26 DIAGNOSIS — R1312 Dysphagia, oropharyngeal phase: Secondary | ICD-10-CM | POA: Diagnosis not present

## 2022-10-26 NOTE — Telephone Encounter (Signed)
Home health is at residence and they have talked to patient and wife and a nurse will be coming at least once a week. I did tell daughter if his back continues to hurt to go to [facility]

## 2022-10-26 NOTE — Patient Instructions (Addendum)

## 2022-10-26 NOTE — Telephone Encounter (Addendum)
Patients Daughter is calling , stating that the patient hurt his back a few weeks ago, is in a lot of pain. Patient is needing some extra help, he can barely eat, only soft food, barely walk, he is hunched over. Patients wife is in denial , is feeding him food that he can not eat, doesn't want him to have help in the house, but patient is desperately needing help. Wife will not let him use the walker.   Daughter Scherry Ran)   [phone]

## 2022-10-27 ENCOUNTER — Telehealth: Payer: Self-pay | Admitting: Neurology

## 2022-10-27 NOTE — Telephone Encounter (Signed)
Called patient and answered questions

## 2022-10-27 NOTE — Telephone Encounter (Signed)
Patients wife called with questions about hospice

## 2022-10-28 ENCOUNTER — Ambulatory Visit: Payer: Medicare Other | Admitting: Occupational Therapy

## 2022-10-28 ENCOUNTER — Ambulatory Visit: Payer: Medicare Other

## 2022-10-28 DIAGNOSIS — G20A1 Parkinson's disease without dyskinesia, without mention of fluctuations: Secondary | ICD-10-CM

## 2022-10-28 DIAGNOSIS — R2689 Other abnormalities of gait and mobility: Secondary | ICD-10-CM | POA: Diagnosis not present

## 2022-10-28 DIAGNOSIS — R2681 Unsteadiness on feet: Secondary | ICD-10-CM

## 2022-10-28 DIAGNOSIS — Z9181 History of falling: Secondary | ICD-10-CM

## 2022-10-28 DIAGNOSIS — M6281 Muscle weakness (generalized): Secondary | ICD-10-CM

## 2022-10-28 DIAGNOSIS — R1312 Dysphagia, oropharyngeal phase: Secondary | ICD-10-CM | POA: Diagnosis not present

## 2022-10-28 DIAGNOSIS — R278 Other lack of coordination: Secondary | ICD-10-CM | POA: Diagnosis not present

## 2022-10-28 DIAGNOSIS — R29818 Other symptoms and signs involving the nervous system: Secondary | ICD-10-CM | POA: Diagnosis not present

## 2022-10-28 NOTE — Therapy (Signed)
OUTPATIENT SPEECH LANGUAGE PATHOLOGY SWALLOW EVALUATION   Patient Name: Johnny Fox MRN: [id number] DOB:1928/12/29, 89 y.o., male Today's Date: 10/28/2022  PCP: Eleanora Neighbor, MD REFERRING PROVIDER: Tat, Octaviano Batty, DO  END OF SESSION:    Past Medical History:  Diagnosis Date   Actinic keratoses    and seborrheic keratoses   Arrhythmia    afib postoperatively after CABG in 2004, No recurrence   Coronary artery disease    Diabetes mellitus without complication (HCC)    diet controlled   Dyslipidemia    ED (erectile dysfunction)    Elevated PSA    2010, workup with Dr. Isabel Caprice   H/O prostate biopsy    Hypertension    Mild carotid artery disease (HCC)    Onychomycosis    Prostate cancer (HCC)    PSA's run approx 7- 2011-2013   Right shoulder injury    july 2014, Dr. Caryn Bee Supple   Tick bite of abdomen    2013, treated with 3wks of doxycycline, tick engorged 3.41mm nodule left thyroid 4/14- seen on carotid u/s   Past Surgical History:  Procedure Laterality Date   CARDIAC SURGERY     CATARACT EXTRACTION, BILATERAL     CORONARY ARTERY BYPASS GRAFT  2004   Patient Active Problem List   Diagnosis Date Noted   Bilateral impacted cerumen 10/09/2022   Sensorineural hearing loss, bilateral 10/09/2022   Aortic aneurysm (HCC) 03/26/2021   Abnormal gait 03/13/2020   Bronchitis 03/13/2020   Chin laceration 03/13/2020   Cough 03/13/2020   Cramp and spasm 03/13/2020   Disorder of penis 03/13/2020   Dizzy spells 03/13/2020   Dyspnea 03/13/2020   Edema of lower extremity 03/13/2020   Elevated PSA 03/13/2020   Encounter for general adult medical examination without abnormal findings 03/13/2020   History of malignant neoplasm of prostate 03/13/2020   Low back pain 03/13/2020   Malignant tumor of prostate (HCC) 03/13/2020   Neuropathy 03/13/2020   Orthostatic hypotension 03/13/2020   Other long term (current) drug therapy 03/13/2020   Other specified abnormal  findings of blood chemistry 03/13/2020   Other sprain of right hip, initial encounter 03/13/2020   Paresthesia 03/13/2020   Parkinson's disease (HCC) 03/13/2020   Paroxysmal atrial fibrillation (HCC) 03/13/2020   Polyneuropathy due to type 2 diabetes mellitus (HCC) 03/13/2020   Presence of aortocoronary bypass graft 03/13/2020   Sciatica, right side 03/13/2020   Shoulder joint pain 03/13/2020   Skin sensation disturbance 03/13/2020   Type 2 diabetes mellitus without complications (HCC) 03/13/2020   Upper respiratory infection 03/13/2020   Vitamin D deficiency 03/13/2020   Right leg numbness 01/14/2015   Thyroid nodule 01/14/2015   Coronary atherosclerosis of native coronary artery 02/15/2013   Essential hypertension, benign 02/15/2013   Pure hypercholesterolemia 02/15/2013   Fatigue 02/15/2013    ONSET DATE: "About three years ago" - script dated 09/30/22  REFERRING DIAG:  R13.19 (ICD-10-CM) - Dysphagia, neurologic    THERAPY DIAG:  No diagnosis found.  Rationale for Evaluation and Treatment: Rehabilitation  SUBJECTIVE:   SUBJECTIVE STATEMENT: "Do you think this has to do with my Parkinson's?" Pt accompanied by: self - wife Britta Mccreedy in lobby  PERTINENT HISTORY: CAD, DM, HLD, HTN, prostate CA, CABG 2004.  PAIN:  Are you having pain? Yes: NPRS scale: 9/10 Pain location: back  Pain description: "really getting up there" Aggravating factors: IDK Relieving factors: "they're giving me steroid pills now - we'll see"  FALLS: Has patient fallen in last 6 months?  See  PT evaluation for details  LIVING ENVIRONMENT: Lives with: lives with their spouse Lives in: House/apartment  PLOF:  Level of assistance: Independent with ADLs, Independent with IADLs, Needed assistance with ADLs, Needed assistance with IADLS Employment: Retired  PATIENT GOALS: "See what's wrong with this, if anything."  OBJECTIVE:  Note: Objective measures were completed at Evaluation unless otherwise  noted. OBJECTIVE:   DIAGNOSTIC FINDINGS:  EXAM: CHEST - 2 VIEW   COMPARISON:  02/02/2021   FINDINGS: Cardiac shadow is enlarged but stable. Postsurgical changes are noted. Aortic calcifications are again seen. Lungs are clear. No bony abnormality is noted.   IMPRESSION: No active cardiopulmonary disease.   Electronically Signed   By: Alcide Clever M.D.   On: 07/05/2022 20:02  COGNITION: Overall cognitive status: Impaired Areas of impairment:  Memory: Impaired: Short term Functional deficits: Pt states he needs to ask Britta Mccreedy (wife) about appointments, wife is starting to take over medication administration due to more difficulty with memory.  SUBJECTIVE DYSPHAGIA REPORTS:  Date of onset: Early 2024 Reported symptoms: globus sensation with drier foods, primarily. Transit of meds reportedly WNL.  Current diet: regular and thin liquids  Co-morbid voice changes: No  FACTORS WHICH MAY INCREASE RISK OF ADVERSE EVENT IN PRESENCE OF ASPIRATION:  General health: frail or deconditioned  Risk factors: reduced cognitive function    ORAL MOTOR EXAMINATION: Overall status: Impaired: Labial: Bilateral (ROM and Coordination) Lingual: Bilateral (ROM and Coordination) Facial: Bilateral (ROM) Comments: Lingual and labial ROM better with visual model.  CLINICAL SWALLOW ASSESSMENT:   Dentition: missing dentition  (upper molars, bil) Vocal quality at baseline: normal; Vocal intensity 70dB average. Pt states he gets through conversations without decrease in intensity. Remained  Patient directly observed with POs: Yes: regular, dysphagia 3 (soft), and thin liquids  Feeding: able to feed self Liquids provided by: cup [facility] Swallow Protocol:  not administered Oral phase signs and symptoms: prolonged mastication; suspect missing bil molars are basis for prolonged mastication Pharyngeal phase signs and symptoms: multiple swallows, audible swallow, and wet vocal quality; Consistent multiple  swallows, and mild hydrophonic voice (occasionally) with peanut butter crackers, consistent audible swallow with water sips when not paired with drier food (crackers)   PATIENT REPORTED OUTCOME MEASURES (PROM): EAT-10: administered 10/28/22 and pt scored himself 10/40 where lower scores indicate less impact of deficit on pt's QOL.     TODAY'S TREATMENT:                                                                                                                                         DATE:  10/28/22: Pt has not yet had MBS so SLP assisted pt with EAT-10. Pt scored himself 10/40, where lower scores indicate less impact of deficit on pt's QOL. Pt rated himself 1/4 (minimal difficulty) with foods getting stuck in throat, but SLP observed pt with consistent multiple swallows (suggesting pharyngeal residue) with peanut butter  crackers during bedside swallow eval.  SLP discussed with pt and wife how HHST and OPST differ, as pt met with [facility] Monday to [facility] services. Pt and wife will hold off on [facility] services until OP services have been completed.   10/19/22 (eval): n/a  PATIENT EDUCATION: Education details: see "today's treatment" Person educated: Patient and Spouse Education method: Explanation Education comprehension: verbalized understanding and needs further education   ASSESSMENT:  CLINICAL IMPRESSION: Patient is a 89 y.o. M who was seen today for treatment of swallowing - administration of EAT-10. During clinical assessment of swallowing (bedside), liquids alone resulted in audible swallow, consistently. Cracker (peanut butter crackers) resulted in prolonged mastication and need for liquid wash in order to clear from pharynx. Pt also exhibited occasional hydrophonic voice after bites of crackers. See "today's treatment" for more details of today's visit.  OBJECTIVE IMPAIRMENTS: include memory and dysphagia. These impairments are limiting patient from managing appointments, household  responsibilities, ADLs/IADLs, effectively communicating at home and in community, and safety when swallowing. Factors affecting potential to achieve goals and functional outcome are ability to learn/carryover information and pain level. Patient will benefit from skilled SLP services to address above impairments and improve overall function.  REHAB POTENTIAL: Good   GOALS: Goals reviewed with patient? No   LONG TERM GOALS: (STGs deferred due to 5 week plan of care)  Target date: 11/27/22  Pt will follow precautions from MBS with rare min A in 2 sessions Baseline:  Goal status: INITIAL  2.  Pt will complete HEP derived from results from MBS with occasional min A, in two sessions Baseline:  Goal status: INITIAL  3.  Pt and/or wife will provide SLP with three overt s/sx aspiration PNA with modified independence Baseline:  Goal status: INITIAL  4.  Pt's PROM score will improve compared to initial administration Baseline:  Goal status: INITIAL  5.  Pt will undergo MBS to ascertain current swallow function, ID any weak swallowing musculature, and ID any positional modifications that could decr risk of aspiration  Baseline:  Goal status: INITIAL   PLAN:  SLP FREQUENCY: 1x/week  SLP DURATION:  5 sessions  PLANNED INTERVENTIONS: Aspiration precaution training, Pharyngeal strengthening exercises, Diet toleration management , Environmental controls, Trials of upgraded texture/liquids, Internal/external aids, Oral motor exercises, SLP instruction and feedback, Compensatory strategies, and Patient/family education    [facility], CCC-SLP 10/28/2022, 11:16 AM

## 2022-10-28 NOTE — Therapy (Signed)
OUTPATIENT OCCUPATIONAL THERAPY [facility] Treatment Note  Patient Name: Johnny Fox MRN: [id number] DOB:1928-08-19, 89 y.o., male Today's Date: 10/28/2022  PCP: Emilio Aspen, MD REFERRING PROVIDER: Vladimir Faster, DO  END OF SESSION:  OT End of Session - 10/28/22 0935     Visit Number 6    Number of Visits 13    Date for OT Re-Evaluation 11/13/22    Authorization Type Medicare A&B    OT Start Time 0930    OT Stop Time 1015    OT Time Calculation (min) 45 min    Activity Tolerance Patient limited by pain                 Past Medical History:  Diagnosis Date   Actinic keratoses    and seborrheic keratoses   Arrhythmia    afib postoperatively after CABG in 2004, No recurrence   Coronary artery disease    Diabetes mellitus without complication (HCC)    diet controlled   Dyslipidemia    ED (erectile dysfunction)    Elevated PSA    2010, workup with Dr. Isabel Caprice   H/O prostate biopsy    Hypertension    Mild carotid artery disease (HCC)    Onychomycosis    Prostate cancer (HCC)    PSA's run approx 7- 2011-2013   Right shoulder injury    july 2014, Dr. Caryn Bee Supple   Tick bite of abdomen    2013, treated with 3wks of doxycycline, tick engorged 3.32mm nodule left thyroid 4/14- seen on carotid u/s   Past Surgical History:  Procedure Laterality Date   CARDIAC SURGERY     CATARACT EXTRACTION, BILATERAL     CORONARY ARTERY BYPASS GRAFT  2004   Patient Active Problem List   Diagnosis Date Noted   Bilateral impacted cerumen 10/09/2022   Sensorineural hearing loss, bilateral 10/09/2022   Aortic aneurysm (HCC) 03/26/2021   Abnormal gait 03/13/2020   Bronchitis 03/13/2020   Chin laceration 03/13/2020   Cough 03/13/2020   Cramp and spasm 03/13/2020   Disorder of penis 03/13/2020   Dizzy spells 03/13/2020   Dyspnea 03/13/2020   Edema of lower extremity 03/13/2020   Elevated PSA 03/13/2020   Encounter for general adult medical examination  without abnormal findings 03/13/2020   History of malignant neoplasm of prostate 03/13/2020   Low back pain 03/13/2020   Malignant tumor of prostate (HCC) 03/13/2020   Neuropathy 03/13/2020   Orthostatic hypotension 03/13/2020   Other long term (current) drug therapy 03/13/2020   Other specified abnormal findings of blood chemistry 03/13/2020   Other sprain of right hip, initial encounter 03/13/2020   Paresthesia 03/13/2020   Parkinson's disease (HCC) 03/13/2020   Paroxysmal atrial fibrillation (HCC) 03/13/2020   Polyneuropathy due to type 2 diabetes mellitus (HCC) 03/13/2020   Presence of aortocoronary bypass graft 03/13/2020   Sciatica, right side 03/13/2020   Shoulder joint pain 03/13/2020   Skin sensation disturbance 03/13/2020   Type 2 diabetes mellitus without complications (HCC) 03/13/2020   Upper respiratory infection 03/13/2020   Vitamin D deficiency 03/13/2020   Right leg numbness 01/14/2015   Thyroid nodule 01/14/2015   Coronary atherosclerosis of native coronary artery 02/15/2013   Essential hypertension, benign 02/15/2013   Pure hypercholesterolemia 02/15/2013   Fatigue 02/15/2013    ONSET DATE: referral 09/17/22 (PD diagnosis May 2021)  REFERRING DIAG: G20.A1 (ICD-10-CM) - Parkinson's disease without dyskinesia or fluctuating manifestations  THERAPY DIAG:  Other symptoms and signs involving the nervous system  Muscle  weakness (generalized)  Unsteadiness on feet  Rationale for Evaluation and Treatment: Rehabilitation  SUBJECTIVE:   SUBJECTIVE STATEMENT: Pt's spouse reporting pt having difficulty getting in/out of bed Pt accompanied by: self and family member (spouse)  PERTINENT HISTORY: ET/PD diagnosed approx May 2021; PMH includes repeated falls, CAD, HTN, afib s/p CABG in 2004, hx of prostate cancer, DMT2, and chronic R shoulder pain, hearing loss  PRECAUTIONS: Fall  WEIGHT BEARING RESTRICTIONS: No  PAIN:  Are you having pain? Yes: NPRS scale:  8/10 Pain location: back Pain description: sharp, shooting Aggravating factors: positional Relieving factors: massage with tennis ball  FALLS: Has patient fallen in last 6 months? Yes. Number of falls 1, pt reports having one fall (backwards) walking through family room, but does not feel like he tripped   LIVING ENVIRONMENT: Lives with: lives with their spouse Lives in: House/apartment Stairs:  level entry in the back; a few steps to enter the front; 2 steps inside from family room to sun room Has following equipment at home: Single point cane, Environmental consultant - 4 wheeled, Shower bench (built in), bed side commode, and Grab bars  PLOF: Requires assistive device for independence  PATIENT GOALS: improve on my balane  OBJECTIVE:   HAND DOMINANCE: Right  ADLs: Overall ADLs: Increased time Transfers/ambulation related to ADLs: Using [facility] for mobility in home and in community, using RW at home as well Eating: decreased grip strength to open containers, reports some recent difficulty with swallowing Grooming: some difficulty with combing hair due to "bad shoulder" on R from fall many years ago UB Dressing: recently increased difficulty with buttons (does typically wear collared shirts with buttons) LB Dressing: sometimes needs assistance with tying L shoe Toileting: Mod I Bathing: Mod I Tub Shower transfers: Mod I Equipment: Grab bars, Walk in shower, and bed side commode  IADLs: Light housekeeping: washes dishes, help with making the bed every morning Meal Prep: spouse does all the cooking Community mobility: no longer driving Medication management: wife fills pill box and reorders as needed Handwriting: 90% legible, Mild micrographia, and 12.47 sec on PPT #1 (whales live in a [place].)  MOBILITY STATUS: Hx of falls  POSTURE COMMENTS:   rounded shoulders, forward head, and increased thoracic kyphosis  ACTIVITY TOLERANCE: Activity tolerance: WFL for tasks assessed on  eval  FUNCTIONAL OUTCOME MEASURES: Standing functional reach: Right: 5.25 inches; Left: 5 inches Fastening/unfastening 3 buttons: 1:13.28 Physical performance test: PPT#2 (simulated eating) 20.19 sec (noted mild tremor when scooping) & PPT#4 (donning/doffing jacket): 20.95 sec  COORDINATION: Finger Nose Finger test: Mild B tremors, good speed 9 Hole Peg test: Right: 33.78 sec; Left: 38.41 sec Box and Blocks:  Right 43 blocks, Left 40 blocks Tremors: action and Right  UE ROM:   R shoulder limited due to pain, shoulder flexion grossly 130, able to achieve internal and external rotation with reports of mild pain  UE MMT:  R shoulder limited due to pain (grossly 3+/5); LUE WFL (grossly 4+5), B elbows grossly 4+/5   Grip strength: R: 38# and L: 45#  SENSATION: WFL  COGNITION: Overall cognitive status: Within functional limits for tasks assessed; pt and spouse report some trouble with memory but wife notes his is not worse than hers. He has mostly trouble with names.  OBSERVATIONS: Bradykinesia   TODAY'S TREATMENT:  DATE:  10/28/22 Bed mobility: OT instructed pt to demonstrate how he gets in and out of bed on mat table.  Pt demonstrating difficulty advancing BLE into bed without increase in back pain.  OT educated on leaning down on to elbow to allow for ease of sit > sidelying and simultaneously bringing up BLE, providing demonstration and handouts to facilitate carryover.  Over prolonged seated rest break, pt completed one more attempt with increased ease with lowering self down on elbow, however still with c/o back pain with bed mobility.  Pt's spouse reports this technique should improve his ease with getting in/out of bed, as he ultimately completed with improved ease (despite back pain).  Med management: pt and spouse report use of grid for steroid taper worked well with recall and sequencing of  progressively reduced dose. Moist heat: applied moist heat x10 mins to lower back to facilitate reduction of pain s/p bed mobility.  Engaged in discussion regarding Monday's appt with hospice/palliative care and next steps.  Pt's spouse reports that she is interested in available support but wants for him to continue [employer] and OP therapies as able.   10/19/22 Med management: educated on techniques to aid in memory/recall with medications.  OT provided information on Pill Map use to organize and double check meds, especially when filling pill box.  OT educated on use of grid for steroid taper with progressively reduced grid each day.  Also recommended filling med cup with amount per day to allow for double check with grid. Provided with handouts of each.   10/16/22 Coordination: completed with LUE and RUE with focus on quality and amplitude as appropriate rotating 2 balls in hand clockwise and counter clockwise, transitioned to rotating 2 large dice in hands to correctly create given number.  Also incorporating cognitive challenge, pt able to correctly identify factors but with difficulty with recall when rotating dice to locate named numbers.   Flipping and rotating coins.  Pt initially flipping coins, utilizing table top as assist therefore transitioned to rotating to further challenge coordination and motor control. Pt with difficulty on L side, utilizing more translation due to decreased control with finger tips. In-hand manipulation with coins. picking up coins 1 at a time and translating palm to fingertips to place into a bowl incorporating small trunk rotation to reach across midline.  Increased challenge to placing into coin slot in same manner.  Pt demonstrating improvements in motor control and translation of coins to finger tips and placing into coin slot.  Pt reports increased pain in back even with slight anterior weight shift and reaching across midline. Buttons: engaged in  massed practice with buttoning and unbuttoning.  Then timed with pt completing in 1:26.87, demonstrating increased difficulty with managing buttons as he fatigued.     PATIENT EDUCATION: Education details: ongoing condition specific education Person educated: Patient Education method: Explanation Education comprehension: verbalized understanding and needs further education  HOME EXERCISE PROGRAM: Coordination handout - see pt instructions  GOALS: Goals reviewed with patient? Yes  SHORT TERM GOALS: Target date: 10/23/22  Pt will be independent with PD specific HEP Baseline: Goal status: IN PROGRESS  2.  Pt will verbalize understanding of strategies to decrease risk of further complication related to PD, including corresponding energy conservation strategies to improve participation in IADLs  Baseline:  Goal status: IN PROGRESS  3.  Pt will demonstrate increased ease w/ manipulation of clothing fasteners by decreasing time to complete 3 buttons to less than 60 seconds. Baseline:  1:13.28 Goal status: IN PROGRESS   LONG TERM GOALS: Target date: 11/13/22  Pt will improve participation in self-feeding as evidenced by decreasing time to move 5 beans from a bowl w/ a spoon, incorporating compensatory strategies/AE prn  Baseline: 20.19 sec  Goal status: IN PROGRESS  2.  Pt will demonstrate increased ease w/ manipulation of clothing fasteners by decreasing time to complete 3 buttons to less than 50 seconds. Baseline: 1:13.28 Goal status: IN PROGRESS  3.  Pt will demonstrate increased ease with dressing as evidenced by decreasing PPT#4(don/ doff jacket) to 15 secs or less Baseline: 20.95 sec Goal status: IN PROGRESS  4.  Pt will demonstrate improved dynamic standing balance and endurance as needed to complete grooming and/or housekeeping tasks with improved ease. Baseline: reports pain in lower back with prolonged standing. Goal status: IN PROGRESS  ASSESSMENT:  CLINICAL  IMPRESSION: Pt demonstrating improved ease with bed mobility with use of sitting > sidelying, leaning down on elbow and pushing back up from elbow.  Pt still very limited in activity tolerance due to low back pain.  Pt and spouse pleased with recommendations to increase ease with bed mobility and want for him to continue with OP services as he can tolerate.  Pt and spouse to discuss with children "next steps" in regards to information received from hospice/palliative consult earlier this week to aid in appropriate level of aid and care.   PERFORMANCE DEFICITS: in functional skills including ADLs, IADLs, coordination, ROM, strength, pain, flexibility, Fine motor control, Gross motor control, balance, body mechanics, endurance, decreased knowledge of precautions, decreased knowledge of use of DME, and UE functional use, cognitive skills including memory, and psychosocial skills including coping strategies and environmental adaptation.   IMPAIRMENTS: are limiting patient from ADLs, IADLs, and rest and sleep.   COMORBIDITIES:  may have co-morbidities  that affects occupational performance. Patient will benefit from skilled OT to address above impairments and improve overall function.  MODIFICATION OR ASSISTANCE TO COMPLETE EVALUATION: Min-Moderate modification of tasks or assist with assess necessary to complete an evaluation.  OT OCCUPATIONAL PROFILE AND HISTORY: Detailed assessment: Review of records and additional review of physical, cognitive, psychosocial history related to current functional performance.  CLINICAL DECISION MAKING: Moderate - several treatment options, min-mod task modification necessary  REHAB POTENTIAL: Good  EVALUATION COMPLEXITY: Moderate    PLAN:  OT FREQUENCY: 2x/week  OT DURATION: 6 weeks  PLANNED INTERVENTIONS: self care/ADL training, therapeutic exercise, therapeutic activity, neuromuscular re-education, manual therapy, passive range of motion, balance training,  functional mobility training, ultrasound, compression bandaging, moist heat, cryotherapy, patient/family education, cognitive remediation/compensation, psychosocial skills training, energy conservation, coping strategies training, and DME and/or AE instructions  RECOMMENDED OTHER SERVICES: NA  CONSULTED AND AGREED WITH PLAN OF CARE: Patient  PLAN FOR NEXT SESSION: Review coordination HEP, initiate shoulder ROM and stretches, large amplitude (be aware of R shoulder and back pain).  Review meds (pill map and grid).   Rosalio Loud, OTR/L 10/28/2022, 9:38 AM

## 2022-10-28 NOTE — Therapy (Signed)
OUTPATIENT PHYSICAL THERAPY NEURO TREATMENT   Patient Name: Johnny Fox MRN: [id number] DOB:[birth year], 89 y.o., male Today's Date: 10/28/2022   PCP: Emilio Aspen, MD  REFERRING PROVIDER: Vladimir Faster, DO  END OF SESSION:  PT End of Session - 10/28/22 1026     Visit Number 8    Number of Visits 13    Date for PT Re-Evaluation 11/11/22    Authorization Type Medicare/BCBS    PT Start Time 1015    PT Stop Time 1100    PT Time Calculation (min) 45 min    Equipment Utilized During Treatment Gait belt    Activity Tolerance Patient limited by pain;Patient tolerated treatment well    Behavior During Therapy [facility] for tasks assessed/performed                Past Medical History:  Diagnosis Date   Actinic keratoses    and seborrheic keratoses   Arrhythmia    afib postoperatively after CABG in 2004, No recurrence   Coronary artery disease    Diabetes mellitus without complication (HCC)    diet controlled   Dyslipidemia    ED (erectile dysfunction)    Elevated PSA    2010, workup with Dr. Isabel Caprice   H/O prostate biopsy    Hypertension    Mild carotid artery disease (HCC)    Onychomycosis    Prostate cancer (HCC)    PSA's run approx 7- 2011-2013   Right shoulder injury    july 2014, Dr. Caryn Bee Supple   Tick bite of abdomen    2013, treated with 3wks of doxycycline, tick engorged 3.76mm nodule left thyroid 4/14- seen on carotid u/s   Past Surgical History:  Procedure Laterality Date   CARDIAC SURGERY     CATARACT EXTRACTION, BILATERAL     CORONARY ARTERY BYPASS GRAFT  2004   Patient Active Problem List   Diagnosis Date Noted   Bilateral impacted cerumen 10/09/2022   Sensorineural hearing loss, bilateral 10/09/2022   Aortic aneurysm (HCC) 03/26/2021   Abnormal gait 03/13/2020   Bronchitis 03/13/2020   Chin laceration 03/13/2020   Cough 03/13/2020   Cramp and spasm 03/13/2020   Disorder of penis 03/13/2020   Dizzy spells 03/13/2020   Dyspnea  03/13/2020   Edema of lower extremity 03/13/2020   Elevated PSA 03/13/2020   Encounter for general adult medical examination without abnormal findings 03/13/2020   History of malignant neoplasm of prostate 03/13/2020   Low back pain 03/13/2020   Malignant tumor of prostate (HCC) 03/13/2020   Neuropathy 03/13/2020   Orthostatic hypotension 03/13/2020   Other long term (current) drug therapy 03/13/2020   Other specified abnormal findings of blood chemistry 03/13/2020   Other sprain of right hip, initial encounter 03/13/2020   Paresthesia 03/13/2020   Parkinson's disease (HCC) 03/13/2020   Paroxysmal atrial fibrillation (HCC) 03/13/2020   Polyneuropathy due to type 2 diabetes mellitus (HCC) 03/13/2020   Presence of aortocoronary bypass graft 03/13/2020   Sciatica, right side 03/13/2020   Shoulder joint pain 03/13/2020   Skin sensation disturbance 03/13/2020   Type 2 diabetes mellitus without complications (HCC) 03/13/2020   Upper respiratory infection 03/13/2020   Vitamin D deficiency 03/13/2020   Right leg numbness 01/14/2015   Thyroid nodule 01/14/2015   Coronary atherosclerosis of native coronary artery 02/15/2013   Essential hypertension, benign 02/15/2013   Pure hypercholesterolemia 02/15/2013   Fatigue 02/15/2013    ONSET DATE: months  REFERRING DIAG: G20.A1 (ICD-10-CM) - Parkinson's disease without dyskinesia  or fluctuating manifestations  THERAPY DIAG:  Other symptoms and signs involving the nervous system  Muscle weakness (generalized)  Unsteadiness on feet  Other abnormalities of gait and mobility  Parkinson's disease without dyskinesia or fluctuating manifestations (HCC)  History of falling  Rationale for Evaluation and Treatment: Rehabilitation  SUBJECTIVE:                                                                                                                                                                                             SUBJECTIVE  STATEMENT: Continuing to have significant low back pain R>L limiting activity tolerance and participation   Pt accompanied by: self, daughter. wife  PERTINENT HISTORY: CAD, DM, HLD, HTN, prostate CA, CABG 2004  PAIN:  Are you having pain? Yes: NPRS scale: "7-8"/10 Pain location: low back/lumbar Pain description: sharp, ache, sore Aggravating factors: movement Relieving factors: tylenol and salon pas  PRECAUTIONS: Fall  RED FLAGS: None   WEIGHT BEARING RESTRICTIONS: No  FALLS: Has patient fallen in last 6 months? Yes. Number of falls 1  LIVING ENVIRONMENT: Lives with: lives with their spouse Lives in: House/apartment Stairs:  no steps to enter; 2 steps to sunroom Has following equipment at home: Dan Humphreys - 4 wheeled, shower chair, Grab bars, and cane  PLOF: Independent; retired   PATIENT GOALS: "improve on my ability to move around"  OBJECTIVE:   TODAY'S TREATMENT: 10/28/22 Activity Comments  Supported prone x 5 min W/ MHP and performing gentle ROM for extension via elbow pressups. Tolerated well without change to symptoms  Manual therapy -STM to lumbar area and paraspinals and trigger point release to prominent right QL area of palpable point -Grade 2 oscillations PA to lumbar spine to reduce pain and facilitate extension/downgliding  Assisted lumbar flexion-bias stretching X 60 sec, tolerated well no incr in symptoms     Post-tx back pain 7-8/10          TODAY'S TREATMENT: 10/26/22 Activity Comments  Gait and transfer training with 4WW and RW, cane, no AD 4x140 ft Cues to "lock the brakes down", avoid reaching for items to stand; some trouble navigating around tight spaces; pt prefers 4WW or cane  TENS to LB with MHP  10 min; amplitude to tolerance; pt reports no improvement in pain immediately after                PATIENT EDUCATION: Education details: edu on ok from PA to try TENS, edu on TENS benefits, precautions, electrode placement, encouraged consistent  HEP compliance and walking inside the house, as well as gait with cane for short distances inside the house, 4WW for  longer distances  Person educated: Patient, Spouse, and Child(ren) Education method: Explanation, Demonstration, Tactile cues, Verbal cues, and Handouts Education comprehension: verbalized understanding and returned demonstration    Access Code: [email or web address] URL: [email or web address] Date: 10/07/2022 Prepared by: [facility] Out[facility]  Re[facility] - [facility]  Exercises - Seated Long Arc Quad  - 1 x daily - 5 x weekly - 2 sets - 10 reps - Seated March  - 1 x daily - 5 x weekly - 2 sets - 10 reps - Seated Ankle Dorsiflexion AROM  - 1 x daily - 5 x weekly - 2 sets - 10 reps - Supine Lower Trunk Rotation  - 1 x daily - 7 x weekly - 1-2 sets - 10 reps - Hooklying Single Knee to Chest  - 1 x daily - 7 x weekly - 1 sets - 3 reps - 15-30 sec hold      --------------------------------------------------------------------  DIAGNOSTIC FINDINGS: none recent  COGNITION: Overall cognitive status: Within functional limits for tasks assessed   SENSATION: Reports baseline N/T in B feet d/t hx neuropathy   COORDINATION: Alternating pronation/supination: B WNL Alternating toe tap: B WNL Finger to nose: B WNL   MUSCLE TONE: significant rigidity in B LEs    POSTURE: rounded shoulders, forward head, and increased thoracic kyphosis  LOWER EXTREMITY ROM:     Active  Right Eval Left Eval  Hip flexion    Hip extension    Hip abduction    Hip adduction    Hip internal rotation    Hip external rotation    Knee flexion    Knee extension    Ankle dorsiflexion 8 5  Ankle plantarflexion    Ankle inversion    Ankle eversion     (Blank rows = not tested)  LOWER EXTREMITY MMT:    MMT (in sitting) Right Eval Left Eval  Hip flexion 4+ 4+  Hip extension    Hip abduction 4+ 4+  Hip adduction 4 4  Hip internal rotation    Hip external rotation    Knee  flexion 4+ 4+  Knee extension 4+ 4+  Ankle dorsiflexion 4+ 4+  Ankle plantarflexion 4+ 4+  Ankle inversion    Ankle eversion    (Blank rows = not tested)   GAIT: Gait pattern: B short step length; occasional freezing Assistive device utilized: Single point cane Level of assistance: Modified independence and SBA   FUNCTIONAL TESTS:    *patient reported fatigue with testing today and required sit break in between tasks    TODAY'S TREATMENT:                                                                                                                              DATE: 09/30/22    PATIENT EDUCATION: Education details: prognosis, POC, benefits of ST Person educated: Patient Education method: Explanation, Demonstration, Tactile cues, and Verbal cues Education comprehension: verbalized understanding  HOME EXERCISE PROGRAM: Not initiated  GOALS: Goals reviewed with patient? Yes  SHORT TERM GOALS: Target date: 10/21/2022  Patient to be independent with initial HEP. Baseline: HEP initiated Goal status: IN PROGRESS    LONG TERM GOALS: Target date: 11/11/2022  Patient to be independent with advanced HEP. Baseline: Not yet initiated  Goal status: IN PROGRESS  Patient to improve MiniBestTest score to atleast 17-21 to decrease risk of falls.  Baseline: not yet completed  Goal status: IN PROGRESS  Patient to report 50% improvement in balance confidence during [employer] classes.  Baseline: reports fear of falling  Goal status: IN PROGRESS  Patient to demonstrate 5xSTS test in <15 sec in order to decrease risk of falls.   Baseline: 23.41 sec pushing off knees   unable to stand fully on several reps Goal status: IN PROGRESS  Patient to verbalize understanding of fall prevention in home environment information. Baseline: Not yet initiated Goal status: IN PROGRESS    ASSESSMENT:  CLINICAL IMPRESSION: Continuing to experience significant LBP without much  relief from recent steroid pack and conservative measures by PT.  Following interventions pt notes no change to symptoms.  Does not seem to be responding well to conservative measures. Pt reports they have f/u with MD for assessment. Continued sessions as indicated  OBJECTIVE IMPAIRMENTS: Abnormal gait, decreased activity tolerance, decreased balance, decreased endurance, decreased ROM, impaired flexibility, and impaired tone.   ACTIVITY LIMITATIONS: carrying, lifting, bending, sitting, standing, squatting, sleeping, stairs, transfers, bed mobility, bathing, toileting, dressing, reach over head, and hygiene/grooming  PARTICIPATION LIMITATIONS: meal prep, cleaning, laundry, shopping, community activity, and church  PERSONAL FACTORS: Age, Past/current experiences, Time since onset of injury/illness/exacerbation, and 3+ comorbidities: CAD, DM, HLD, HTN, prostate CA, CABG 2004  are also affecting patient's functional outcome.   REHAB POTENTIAL: Good  CLINICAL DECISION MAKING: Evolving/moderate complexity  EVALUATION COMPLEXITY: Moderate  PLAN:  PT FREQUENCY: 2x/week  PT DURATION: 6 weeks  PLANNED INTERVENTIONS: Therapeutic exercises, Therapeutic activity, Neuromuscular re-education, Balance training, Gait training, Patient/Family education, Self Care, Joint mobilization, Stair training, Vestibular training, Canalith repositioning, DME instructions, Aquatic Therapy, Dry Needling, Electrical stimulation, Cryotherapy, Moist heat, Taping, Manual therapy, and Re-evaluation  PLAN FOR NEXT SESSION:  complete mini best test (as pain allows); initiate HEP for STS and static and dynamic balance.  Review initial HEP    12:43 PM, 10/28/22 M. Shary Decamp, PT, DPT Physical Therapist- [place] Office Number: [phone]

## 2022-10-29 ENCOUNTER — Other Ambulatory Visit: Payer: Self-pay | Admitting: Neurology

## 2022-10-29 DIAGNOSIS — M5451 Vertebrogenic low back pain: Secondary | ICD-10-CM | POA: Diagnosis not present

## 2022-10-29 DIAGNOSIS — M5459 Other low back pain: Secondary | ICD-10-CM | POA: Diagnosis not present

## 2022-10-29 DIAGNOSIS — G20A1 Parkinson's disease without dyskinesia, without mention of fluctuations: Secondary | ICD-10-CM

## 2022-11-02 ENCOUNTER — Ambulatory Visit: Payer: Medicare Other | Admitting: Occupational Therapy

## 2022-11-02 ENCOUNTER — Ambulatory Visit: Payer: Medicare Other

## 2022-11-02 DIAGNOSIS — I480 Paroxysmal atrial fibrillation: Secondary | ICD-10-CM | POA: Diagnosis not present

## 2022-11-02 DIAGNOSIS — E119 Type 2 diabetes mellitus without complications: Secondary | ICD-10-CM | POA: Diagnosis not present

## 2022-11-02 DIAGNOSIS — G20A1 Parkinson's disease without dyskinesia, without mention of fluctuations: Secondary | ICD-10-CM | POA: Diagnosis not present

## 2022-11-02 DIAGNOSIS — N400 Enlarged prostate without lower urinary tract symptoms: Secondary | ICD-10-CM | POA: Diagnosis not present

## 2022-11-02 DIAGNOSIS — I25810 Atherosclerosis of coronary artery bypass graft(s) without angina pectoris: Secondary | ICD-10-CM | POA: Diagnosis not present

## 2022-11-02 DIAGNOSIS — I10 Essential (primary) hypertension: Secondary | ICD-10-CM | POA: Diagnosis not present

## 2022-11-02 DIAGNOSIS — E559 Vitamin D deficiency, unspecified: Secondary | ICD-10-CM | POA: Diagnosis not present

## 2022-11-02 DIAGNOSIS — M545 Low back pain, unspecified: Secondary | ICD-10-CM | POA: Diagnosis not present

## 2022-11-02 DIAGNOSIS — N1832 Chronic kidney disease, stage 3b: Secondary | ICD-10-CM | POA: Diagnosis not present

## 2022-11-02 DIAGNOSIS — R052 Subacute cough: Secondary | ICD-10-CM | POA: Diagnosis not present

## 2022-11-02 DIAGNOSIS — R54 Age-related physical debility: Secondary | ICD-10-CM | POA: Diagnosis not present

## 2022-11-02 DIAGNOSIS — E1142 Type 2 diabetes mellitus with diabetic polyneuropathy: Secondary | ICD-10-CM | POA: Diagnosis not present

## 2022-11-04 ENCOUNTER — Ambulatory Visit: Payer: Medicare Other

## 2022-11-04 ENCOUNTER — Ambulatory Visit: Payer: Medicare Other | Admitting: Occupational Therapy

## 2022-11-04 ENCOUNTER — Ambulatory Visit (HOSPITAL_COMMUNITY)
Admission: RE | Admit: 2022-11-04 | Discharge: 2022-11-04 | Disposition: A | Discharge status: Home / Self Care | Payer: Medicare Other | Source: Ambulatory Visit | Attending: Internal Medicine | Admitting: Internal Medicine

## 2022-11-04 ENCOUNTER — Ambulatory Visit (HOSPITAL_COMMUNITY)
Admission: RE | Admit: 2022-11-04 | Discharge: 2022-11-04 | Disposition: A | Discharge status: Home / Self Care | Payer: Medicare Other | Source: Ambulatory Visit | Attending: Diagnostic Radiology

## 2022-11-04 DIAGNOSIS — R1312 Dysphagia, oropharyngeal phase: Secondary | ICD-10-CM | POA: Diagnosis not present

## 2022-11-04 DIAGNOSIS — K219 Gastro-esophageal reflux disease without esophagitis: Secondary | ICD-10-CM | POA: Diagnosis not present

## 2022-11-04 DIAGNOSIS — M5451 Vertebrogenic low back pain: Secondary | ICD-10-CM | POA: Diagnosis not present

## 2022-11-04 DIAGNOSIS — M5459 Other low back pain: Secondary | ICD-10-CM | POA: Diagnosis not present

## 2022-11-04 DIAGNOSIS — G20A1 Parkinson's disease without dyskinesia, without mention of fluctuations: Secondary | ICD-10-CM | POA: Insufficient documentation

## 2022-11-04 DIAGNOSIS — I10 Essential (primary) hypertension: Secondary | ICD-10-CM | POA: Diagnosis not present

## 2022-11-04 DIAGNOSIS — E1140 Type 2 diabetes mellitus with diabetic neuropathy, unspecified: Secondary | ICD-10-CM | POA: Diagnosis not present

## 2022-11-04 DIAGNOSIS — R1310 Dysphagia, unspecified: Secondary | ICD-10-CM

## 2022-11-04 DIAGNOSIS — R059 Cough, unspecified: Secondary | ICD-10-CM | POA: Diagnosis not present

## 2022-11-04 DIAGNOSIS — I2510 Atherosclerotic heart disease of native coronary artery without angina pectoris: Secondary | ICD-10-CM | POA: Insufficient documentation

## 2022-11-04 DIAGNOSIS — C61 Malignant neoplasm of prostate: Secondary | ICD-10-CM | POA: Diagnosis not present

## 2022-11-06 ENCOUNTER — Ambulatory Visit: Payer: Medicare Other

## 2022-11-06 ENCOUNTER — Telehealth: Payer: Self-pay | Admitting: Neurology

## 2022-11-06 DIAGNOSIS — M5451 Vertebrogenic low back pain: Secondary | ICD-10-CM | POA: Diagnosis not present

## 2022-11-06 DIAGNOSIS — M546 Pain in thoracic spine: Secondary | ICD-10-CM | POA: Diagnosis not present

## 2022-11-06 NOTE — Telephone Encounter (Signed)
Left message with the after hours service on 11-06-22 at 12:32 pm   Caller states she would like to [place]    I called and lmom to see if I could find out more information

## 2022-11-09 NOTE — Telephone Encounter (Signed)
Called patients wife and left message

## 2022-11-10 NOTE — Telephone Encounter (Signed)
Patients wife called to let Dr. Arbutus Leas know of the appointment at [facility] yesterday and I looked up the information from that visit. Patients wife said that they are cancelling all neuro PT appointments and were initially looking at [facility] but feel Home health care is best. They are working with PCP for this . Patients wife just calling with a update and FYI   MRI demonstrates a acute to subacute burst type fracture with 60% loss of the anterior column 30% loss of the middle column no ligamentous injury. No significant spinal canal stenosis from the repulsed bone. Severe disc degeneration L3-4 severe facet arthropathy at L4-5. And severe disc degeneration L5-S1.  1. Subacute burst fracture T12 without neural compression  2. Lower back pain secondary to aggravation of his of his underlying severe disc degeneration L3-4 L4-5 and L5-S1 and severe facet arthropathy at L4-5. Patient has more lower back pain today than lower thoracic pain.    Plan:    We discussed the fracture. I discussed avoiding bending I illustrated ways to get in and out of bed and up and down from her chair without forward flexion. No lifting over 3 pounds no bending without bracing.    We discussed a brace which should help with his lower back pain exacerbation as well as immobilization. We discussed the TLSO however the indicated that he would not wear a brace when up to his upper chest.  Will fit him at least for a LSO that he can pull upwards.    A prescription for an opioid previously was given to be taken as directed for pain control. I discussed the risks including cognitive changes which may affect the ability to operate machinery and to drive. In addition the side effect of constipation as well as to avoid taking with conflicting medications that were discussed.  The patient's prescription drug monitoring report was reviewed with no red flags noted.    I indicated if the Tylenol was not alleviating his pain and taken  a half a tablet of the pain medicine would be an option    He has already had a steroid Dosepak. And is periodically taken ibuprofen.   Follow-up with her 3 weeks for x-ray thoracolumbar spine.

## 2022-11-16 ENCOUNTER — Encounter: Payer: Medicare Other | Admitting: Occupational Therapy

## 2022-11-16 ENCOUNTER — Ambulatory Visit: Payer: Medicare Other

## 2022-11-18 ENCOUNTER — Encounter: Payer: Medicare Other | Admitting: Occupational Therapy

## 2022-11-18 ENCOUNTER — Ambulatory Visit: Payer: Medicare Other

## 2022-11-24 DIAGNOSIS — R350 Frequency of micturition: Secondary | ICD-10-CM | POA: Diagnosis not present

## 2022-11-24 DIAGNOSIS — R351 Nocturia: Secondary | ICD-10-CM | POA: Diagnosis not present

## 2022-11-24 DIAGNOSIS — N401 Enlarged prostate with lower urinary tract symptoms: Secondary | ICD-10-CM | POA: Diagnosis not present

## 2022-11-24 DIAGNOSIS — R3915 Urgency of urination: Secondary | ICD-10-CM | POA: Diagnosis not present

## 2022-11-27 DIAGNOSIS — M5451 Vertebrogenic low back pain: Secondary | ICD-10-CM | POA: Diagnosis not present

## 2022-11-27 DIAGNOSIS — M546 Pain in thoracic spine: Secondary | ICD-10-CM | POA: Diagnosis not present

## 2022-11-30 DIAGNOSIS — I25810 Atherosclerosis of coronary artery bypass graft(s) without angina pectoris: Secondary | ICD-10-CM | POA: Diagnosis not present

## 2022-11-30 DIAGNOSIS — E559 Vitamin D deficiency, unspecified: Secondary | ICD-10-CM | POA: Diagnosis not present

## 2022-11-30 DIAGNOSIS — Z8546 Personal history of malignant neoplasm of prostate: Secondary | ICD-10-CM | POA: Diagnosis not present

## 2022-11-30 DIAGNOSIS — N1832 Chronic kidney disease, stage 3b: Secondary | ICD-10-CM | POA: Diagnosis not present

## 2022-11-30 DIAGNOSIS — R627 Adult failure to thrive: Secondary | ICD-10-CM | POA: Diagnosis not present

## 2022-11-30 DIAGNOSIS — E1122 Type 2 diabetes mellitus with diabetic chronic kidney disease: Secondary | ICD-10-CM | POA: Diagnosis not present

## 2022-11-30 DIAGNOSIS — L821 Other seborrheic keratosis: Secondary | ICD-10-CM | POA: Diagnosis not present

## 2022-11-30 DIAGNOSIS — L570 Actinic keratosis: Secondary | ICD-10-CM | POA: Diagnosis not present

## 2022-11-30 DIAGNOSIS — H9192 Unspecified hearing loss, left ear: Secondary | ICD-10-CM | POA: Diagnosis not present

## 2022-11-30 DIAGNOSIS — G20A1 Parkinson's disease without dyskinesia, without mention of fluctuations: Secondary | ICD-10-CM | POA: Diagnosis not present

## 2022-11-30 DIAGNOSIS — I129 Hypertensive chronic kidney disease with stage 1 through stage 4 chronic kidney disease, or unspecified chronic kidney disease: Secondary | ICD-10-CM | POA: Diagnosis not present

## 2022-11-30 DIAGNOSIS — E1142 Type 2 diabetes mellitus with diabetic polyneuropathy: Secondary | ICD-10-CM | POA: Diagnosis not present

## 2022-11-30 DIAGNOSIS — I480 Paroxysmal atrial fibrillation: Secondary | ICD-10-CM | POA: Diagnosis not present

## 2022-11-30 DIAGNOSIS — N138 Other obstructive and reflux uropathy: Secondary | ICD-10-CM | POA: Diagnosis not present

## 2022-11-30 DIAGNOSIS — Z515 Encounter for palliative care: Secondary | ICD-10-CM | POA: Diagnosis not present

## 2022-11-30 DIAGNOSIS — E785 Hyperlipidemia, unspecified: Secondary | ICD-10-CM | POA: Diagnosis not present

## 2022-11-30 DIAGNOSIS — Z85828 Personal history of other malignant neoplasm of skin: Secondary | ICD-10-CM | POA: Diagnosis not present

## 2022-11-30 DIAGNOSIS — N401 Enlarged prostate with lower urinary tract symptoms: Secondary | ICD-10-CM | POA: Diagnosis not present

## 2022-12-06 DIAGNOSIS — I129 Hypertensive chronic kidney disease with stage 1 through stage 4 chronic kidney disease, or unspecified chronic kidney disease: Secondary | ICD-10-CM | POA: Diagnosis not present

## 2022-12-06 DIAGNOSIS — N401 Enlarged prostate with lower urinary tract symptoms: Secondary | ICD-10-CM | POA: Diagnosis not present

## 2022-12-06 DIAGNOSIS — R627 Adult failure to thrive: Secondary | ICD-10-CM | POA: Diagnosis not present

## 2022-12-06 DIAGNOSIS — Z8546 Personal history of malignant neoplasm of prostate: Secondary | ICD-10-CM | POA: Diagnosis not present

## 2022-12-06 DIAGNOSIS — N1832 Chronic kidney disease, stage 3b: Secondary | ICD-10-CM | POA: Diagnosis not present

## 2022-12-06 DIAGNOSIS — E1122 Type 2 diabetes mellitus with diabetic chronic kidney disease: Secondary | ICD-10-CM | POA: Diagnosis not present

## 2022-12-06 DIAGNOSIS — E785 Hyperlipidemia, unspecified: Secondary | ICD-10-CM | POA: Diagnosis not present

## 2022-12-06 DIAGNOSIS — E559 Vitamin D deficiency, unspecified: Secondary | ICD-10-CM | POA: Diagnosis not present

## 2022-12-06 DIAGNOSIS — N138 Other obstructive and reflux uropathy: Secondary | ICD-10-CM | POA: Diagnosis not present

## 2022-12-06 DIAGNOSIS — I480 Paroxysmal atrial fibrillation: Secondary | ICD-10-CM | POA: Diagnosis not present

## 2022-12-06 DIAGNOSIS — L821 Other seborrheic keratosis: Secondary | ICD-10-CM | POA: Diagnosis not present

## 2022-12-06 DIAGNOSIS — E1142 Type 2 diabetes mellitus with diabetic polyneuropathy: Secondary | ICD-10-CM | POA: Diagnosis not present

## 2022-12-06 DIAGNOSIS — G20A1 Parkinson's disease without dyskinesia, without mention of fluctuations: Secondary | ICD-10-CM | POA: Diagnosis not present

## 2022-12-06 DIAGNOSIS — L570 Actinic keratosis: Secondary | ICD-10-CM | POA: Diagnosis not present

## 2022-12-06 DIAGNOSIS — Z515 Encounter for palliative care: Secondary | ICD-10-CM | POA: Diagnosis not present

## 2022-12-06 DIAGNOSIS — I25810 Atherosclerosis of coronary artery bypass graft(s) without angina pectoris: Secondary | ICD-10-CM | POA: Diagnosis not present

## 2022-12-06 DIAGNOSIS — H9192 Unspecified hearing loss, left ear: Secondary | ICD-10-CM | POA: Diagnosis not present

## 2022-12-06 DIAGNOSIS — Z85828 Personal history of other malignant neoplasm of skin: Secondary | ICD-10-CM | POA: Diagnosis not present

## 2022-12-07 DIAGNOSIS — I129 Hypertensive chronic kidney disease with stage 1 through stage 4 chronic kidney disease, or unspecified chronic kidney disease: Secondary | ICD-10-CM | POA: Diagnosis not present

## 2022-12-07 DIAGNOSIS — I25810 Atherosclerosis of coronary artery bypass graft(s) without angina pectoris: Secondary | ICD-10-CM | POA: Diagnosis not present

## 2022-12-07 DIAGNOSIS — G20A1 Parkinson's disease without dyskinesia, without mention of fluctuations: Secondary | ICD-10-CM | POA: Diagnosis not present

## 2022-12-07 DIAGNOSIS — E1122 Type 2 diabetes mellitus with diabetic chronic kidney disease: Secondary | ICD-10-CM | POA: Diagnosis not present

## 2022-12-07 DIAGNOSIS — R627 Adult failure to thrive: Secondary | ICD-10-CM | POA: Diagnosis not present

## 2022-12-07 DIAGNOSIS — E785 Hyperlipidemia, unspecified: Secondary | ICD-10-CM | POA: Diagnosis not present

## 2022-12-09 DIAGNOSIS — I129 Hypertensive chronic kidney disease with stage 1 through stage 4 chronic kidney disease, or unspecified chronic kidney disease: Secondary | ICD-10-CM | POA: Diagnosis not present

## 2022-12-09 DIAGNOSIS — R627 Adult failure to thrive: Secondary | ICD-10-CM | POA: Diagnosis not present

## 2022-12-09 DIAGNOSIS — E1122 Type 2 diabetes mellitus with diabetic chronic kidney disease: Secondary | ICD-10-CM | POA: Diagnosis not present

## 2022-12-09 DIAGNOSIS — I25810 Atherosclerosis of coronary artery bypass graft(s) without angina pectoris: Secondary | ICD-10-CM | POA: Diagnosis not present

## 2022-12-09 DIAGNOSIS — E785 Hyperlipidemia, unspecified: Secondary | ICD-10-CM | POA: Diagnosis not present

## 2022-12-09 DIAGNOSIS — G20A1 Parkinson's disease without dyskinesia, without mention of fluctuations: Secondary | ICD-10-CM | POA: Diagnosis not present

## 2022-12-10 DIAGNOSIS — E785 Hyperlipidemia, unspecified: Secondary | ICD-10-CM | POA: Diagnosis not present

## 2022-12-10 DIAGNOSIS — I25810 Atherosclerosis of coronary artery bypass graft(s) without angina pectoris: Secondary | ICD-10-CM | POA: Diagnosis not present

## 2022-12-10 DIAGNOSIS — G20A1 Parkinson's disease without dyskinesia, without mention of fluctuations: Secondary | ICD-10-CM | POA: Diagnosis not present

## 2022-12-10 DIAGNOSIS — I129 Hypertensive chronic kidney disease with stage 1 through stage 4 chronic kidney disease, or unspecified chronic kidney disease: Secondary | ICD-10-CM | POA: Diagnosis not present

## 2022-12-10 DIAGNOSIS — R627 Adult failure to thrive: Secondary | ICD-10-CM | POA: Diagnosis not present

## 2022-12-10 DIAGNOSIS — E1122 Type 2 diabetes mellitus with diabetic chronic kidney disease: Secondary | ICD-10-CM | POA: Diagnosis not present

## 2022-12-14 DIAGNOSIS — I129 Hypertensive chronic kidney disease with stage 1 through stage 4 chronic kidney disease, or unspecified chronic kidney disease: Secondary | ICD-10-CM | POA: Diagnosis not present

## 2022-12-14 DIAGNOSIS — I25810 Atherosclerosis of coronary artery bypass graft(s) without angina pectoris: Secondary | ICD-10-CM | POA: Diagnosis not present

## 2022-12-14 DIAGNOSIS — E785 Hyperlipidemia, unspecified: Secondary | ICD-10-CM | POA: Diagnosis not present

## 2022-12-14 DIAGNOSIS — E1122 Type 2 diabetes mellitus with diabetic chronic kidney disease: Secondary | ICD-10-CM | POA: Diagnosis not present

## 2022-12-14 DIAGNOSIS — R627 Adult failure to thrive: Secondary | ICD-10-CM | POA: Diagnosis not present

## 2022-12-14 DIAGNOSIS — G20A1 Parkinson's disease without dyskinesia, without mention of fluctuations: Secondary | ICD-10-CM | POA: Diagnosis not present

## 2022-12-17 DIAGNOSIS — I129 Hypertensive chronic kidney disease with stage 1 through stage 4 chronic kidney disease, or unspecified chronic kidney disease: Secondary | ICD-10-CM | POA: Diagnosis not present

## 2022-12-17 DIAGNOSIS — I25810 Atherosclerosis of coronary artery bypass graft(s) without angina pectoris: Secondary | ICD-10-CM | POA: Diagnosis not present

## 2022-12-17 DIAGNOSIS — E785 Hyperlipidemia, unspecified: Secondary | ICD-10-CM | POA: Diagnosis not present

## 2022-12-17 DIAGNOSIS — G20A1 Parkinson's disease without dyskinesia, without mention of fluctuations: Secondary | ICD-10-CM | POA: Diagnosis not present

## 2022-12-17 DIAGNOSIS — E1122 Type 2 diabetes mellitus with diabetic chronic kidney disease: Secondary | ICD-10-CM | POA: Diagnosis not present

## 2022-12-17 DIAGNOSIS — R627 Adult failure to thrive: Secondary | ICD-10-CM | POA: Diagnosis not present

## 2022-12-18 DIAGNOSIS — E785 Hyperlipidemia, unspecified: Secondary | ICD-10-CM | POA: Diagnosis not present

## 2022-12-18 DIAGNOSIS — G20A1 Parkinson's disease without dyskinesia, without mention of fluctuations: Secondary | ICD-10-CM | POA: Diagnosis not present

## 2022-12-18 DIAGNOSIS — I129 Hypertensive chronic kidney disease with stage 1 through stage 4 chronic kidney disease, or unspecified chronic kidney disease: Secondary | ICD-10-CM | POA: Diagnosis not present

## 2022-12-18 DIAGNOSIS — R627 Adult failure to thrive: Secondary | ICD-10-CM | POA: Diagnosis not present

## 2022-12-18 DIAGNOSIS — E1122 Type 2 diabetes mellitus with diabetic chronic kidney disease: Secondary | ICD-10-CM | POA: Diagnosis not present

## 2022-12-18 DIAGNOSIS — I25810 Atherosclerosis of coronary artery bypass graft(s) without angina pectoris: Secondary | ICD-10-CM | POA: Diagnosis not present

## 2022-12-21 DIAGNOSIS — G20A1 Parkinson's disease without dyskinesia, without mention of fluctuations: Secondary | ICD-10-CM | POA: Diagnosis not present

## 2022-12-21 DIAGNOSIS — E785 Hyperlipidemia, unspecified: Secondary | ICD-10-CM | POA: Diagnosis not present

## 2022-12-21 DIAGNOSIS — R627 Adult failure to thrive: Secondary | ICD-10-CM | POA: Diagnosis not present

## 2022-12-21 DIAGNOSIS — I25810 Atherosclerosis of coronary artery bypass graft(s) without angina pectoris: Secondary | ICD-10-CM | POA: Diagnosis not present

## 2022-12-21 DIAGNOSIS — E1122 Type 2 diabetes mellitus with diabetic chronic kidney disease: Secondary | ICD-10-CM | POA: Diagnosis not present

## 2022-12-21 DIAGNOSIS — I129 Hypertensive chronic kidney disease with stage 1 through stage 4 chronic kidney disease, or unspecified chronic kidney disease: Secondary | ICD-10-CM | POA: Diagnosis not present

## 2022-12-23 DIAGNOSIS — G20A1 Parkinson's disease without dyskinesia, without mention of fluctuations: Secondary | ICD-10-CM | POA: Diagnosis not present

## 2022-12-23 DIAGNOSIS — E1122 Type 2 diabetes mellitus with diabetic chronic kidney disease: Secondary | ICD-10-CM | POA: Diagnosis not present

## 2022-12-23 DIAGNOSIS — R627 Adult failure to thrive: Secondary | ICD-10-CM | POA: Diagnosis not present

## 2022-12-23 DIAGNOSIS — I25810 Atherosclerosis of coronary artery bypass graft(s) without angina pectoris: Secondary | ICD-10-CM | POA: Diagnosis not present

## 2022-12-23 DIAGNOSIS — E785 Hyperlipidemia, unspecified: Secondary | ICD-10-CM | POA: Diagnosis not present

## 2022-12-23 DIAGNOSIS — I129 Hypertensive chronic kidney disease with stage 1 through stage 4 chronic kidney disease, or unspecified chronic kidney disease: Secondary | ICD-10-CM | POA: Diagnosis not present

## 2022-12-24 DIAGNOSIS — S22081A Stable burst fracture of T11-T12 vertebra, initial encounter for closed fracture: Secondary | ICD-10-CM | POA: Diagnosis not present

## 2022-12-24 DIAGNOSIS — I10 Essential (primary) hypertension: Secondary | ICD-10-CM | POA: Diagnosis not present

## 2022-12-24 DIAGNOSIS — M545 Low back pain, unspecified: Secondary | ICD-10-CM | POA: Diagnosis not present

## 2022-12-24 DIAGNOSIS — I480 Paroxysmal atrial fibrillation: Secondary | ICD-10-CM | POA: Diagnosis not present

## 2022-12-24 DIAGNOSIS — I129 Hypertensive chronic kidney disease with stage 1 through stage 4 chronic kidney disease, or unspecified chronic kidney disease: Secondary | ICD-10-CM | POA: Diagnosis not present

## 2022-12-24 DIAGNOSIS — E1142 Type 2 diabetes mellitus with diabetic polyneuropathy: Secondary | ICD-10-CM | POA: Diagnosis not present

## 2022-12-24 DIAGNOSIS — R54 Age-related physical debility: Secondary | ICD-10-CM | POA: Diagnosis not present

## 2022-12-24 DIAGNOSIS — E785 Hyperlipidemia, unspecified: Secondary | ICD-10-CM | POA: Diagnosis not present

## 2022-12-24 DIAGNOSIS — E1122 Type 2 diabetes mellitus with diabetic chronic kidney disease: Secondary | ICD-10-CM | POA: Diagnosis not present

## 2022-12-24 DIAGNOSIS — R600 Localized edema: Secondary | ICD-10-CM | POA: Diagnosis not present

## 2022-12-24 DIAGNOSIS — N400 Enlarged prostate without lower urinary tract symptoms: Secondary | ICD-10-CM | POA: Diagnosis not present

## 2022-12-24 DIAGNOSIS — G20A1 Parkinson's disease without dyskinesia, without mention of fluctuations: Secondary | ICD-10-CM | POA: Diagnosis not present

## 2022-12-24 DIAGNOSIS — R627 Adult failure to thrive: Secondary | ICD-10-CM | POA: Diagnosis not present

## 2022-12-24 DIAGNOSIS — I25810 Atherosclerosis of coronary artery bypass graft(s) without angina pectoris: Secondary | ICD-10-CM | POA: Diagnosis not present

## 2022-12-24 DIAGNOSIS — N1832 Chronic kidney disease, stage 3b: Secondary | ICD-10-CM | POA: Diagnosis not present

## 2022-12-27 DIAGNOSIS — E1122 Type 2 diabetes mellitus with diabetic chronic kidney disease: Secondary | ICD-10-CM | POA: Diagnosis not present

## 2022-12-27 DIAGNOSIS — G20A1 Parkinson's disease without dyskinesia, without mention of fluctuations: Secondary | ICD-10-CM | POA: Diagnosis not present

## 2022-12-27 DIAGNOSIS — E785 Hyperlipidemia, unspecified: Secondary | ICD-10-CM | POA: Diagnosis not present

## 2022-12-27 DIAGNOSIS — R627 Adult failure to thrive: Secondary | ICD-10-CM | POA: Diagnosis not present

## 2022-12-27 DIAGNOSIS — I25810 Atherosclerosis of coronary artery bypass graft(s) without angina pectoris: Secondary | ICD-10-CM | POA: Diagnosis not present

## 2022-12-27 DIAGNOSIS — I129 Hypertensive chronic kidney disease with stage 1 through stage 4 chronic kidney disease, or unspecified chronic kidney disease: Secondary | ICD-10-CM | POA: Diagnosis not present

## 2022-12-28 DIAGNOSIS — E1122 Type 2 diabetes mellitus with diabetic chronic kidney disease: Secondary | ICD-10-CM | POA: Diagnosis not present

## 2022-12-28 DIAGNOSIS — G20A1 Parkinson's disease without dyskinesia, without mention of fluctuations: Secondary | ICD-10-CM | POA: Diagnosis not present

## 2022-12-28 DIAGNOSIS — R627 Adult failure to thrive: Secondary | ICD-10-CM | POA: Diagnosis not present

## 2022-12-28 DIAGNOSIS — E785 Hyperlipidemia, unspecified: Secondary | ICD-10-CM | POA: Diagnosis not present

## 2022-12-28 DIAGNOSIS — I25810 Atherosclerosis of coronary artery bypass graft(s) without angina pectoris: Secondary | ICD-10-CM | POA: Diagnosis not present

## 2022-12-28 DIAGNOSIS — I129 Hypertensive chronic kidney disease with stage 1 through stage 4 chronic kidney disease, or unspecified chronic kidney disease: Secondary | ICD-10-CM | POA: Diagnosis not present

## 2022-12-31 DIAGNOSIS — G20A1 Parkinson's disease without dyskinesia, without mention of fluctuations: Secondary | ICD-10-CM | POA: Diagnosis not present

## 2022-12-31 DIAGNOSIS — I25810 Atherosclerosis of coronary artery bypass graft(s) without angina pectoris: Secondary | ICD-10-CM | POA: Diagnosis not present

## 2022-12-31 DIAGNOSIS — I129 Hypertensive chronic kidney disease with stage 1 through stage 4 chronic kidney disease, or unspecified chronic kidney disease: Secondary | ICD-10-CM | POA: Diagnosis not present

## 2022-12-31 DIAGNOSIS — E785 Hyperlipidemia, unspecified: Secondary | ICD-10-CM | POA: Diagnosis not present

## 2022-12-31 DIAGNOSIS — E1122 Type 2 diabetes mellitus with diabetic chronic kidney disease: Secondary | ICD-10-CM | POA: Diagnosis not present

## 2022-12-31 DIAGNOSIS — R627 Adult failure to thrive: Secondary | ICD-10-CM | POA: Diagnosis not present

## 2023-01-01 DIAGNOSIS — I25810 Atherosclerosis of coronary artery bypass graft(s) without angina pectoris: Secondary | ICD-10-CM | POA: Diagnosis not present

## 2023-01-01 DIAGNOSIS — E1122 Type 2 diabetes mellitus with diabetic chronic kidney disease: Secondary | ICD-10-CM | POA: Diagnosis not present

## 2023-01-01 DIAGNOSIS — I129 Hypertensive chronic kidney disease with stage 1 through stage 4 chronic kidney disease, or unspecified chronic kidney disease: Secondary | ICD-10-CM | POA: Diagnosis not present

## 2023-01-01 DIAGNOSIS — G20A1 Parkinson's disease without dyskinesia, without mention of fluctuations: Secondary | ICD-10-CM | POA: Diagnosis not present

## 2023-01-01 DIAGNOSIS — E785 Hyperlipidemia, unspecified: Secondary | ICD-10-CM | POA: Diagnosis not present

## 2023-01-01 DIAGNOSIS — R627 Adult failure to thrive: Secondary | ICD-10-CM | POA: Diagnosis not present

## 2023-01-03 DIAGNOSIS — G20A1 Parkinson's disease without dyskinesia, without mention of fluctuations: Secondary | ICD-10-CM | POA: Diagnosis not present

## 2023-01-03 DIAGNOSIS — I25810 Atherosclerosis of coronary artery bypass graft(s) without angina pectoris: Secondary | ICD-10-CM | POA: Diagnosis not present

## 2023-01-03 DIAGNOSIS — E785 Hyperlipidemia, unspecified: Secondary | ICD-10-CM | POA: Diagnosis not present

## 2023-01-03 DIAGNOSIS — E1122 Type 2 diabetes mellitus with diabetic chronic kidney disease: Secondary | ICD-10-CM | POA: Diagnosis not present

## 2023-01-03 DIAGNOSIS — R627 Adult failure to thrive: Secondary | ICD-10-CM | POA: Diagnosis not present

## 2023-01-03 DIAGNOSIS — I129 Hypertensive chronic kidney disease with stage 1 through stage 4 chronic kidney disease, or unspecified chronic kidney disease: Secondary | ICD-10-CM | POA: Diagnosis not present

## 2023-01-04 DIAGNOSIS — E1122 Type 2 diabetes mellitus with diabetic chronic kidney disease: Secondary | ICD-10-CM | POA: Diagnosis not present

## 2023-01-04 DIAGNOSIS — I25810 Atherosclerosis of coronary artery bypass graft(s) without angina pectoris: Secondary | ICD-10-CM | POA: Diagnosis not present

## 2023-01-04 DIAGNOSIS — E785 Hyperlipidemia, unspecified: Secondary | ICD-10-CM | POA: Diagnosis not present

## 2023-01-04 DIAGNOSIS — R627 Adult failure to thrive: Secondary | ICD-10-CM | POA: Diagnosis not present

## 2023-01-04 DIAGNOSIS — G20A1 Parkinson's disease without dyskinesia, without mention of fluctuations: Secondary | ICD-10-CM | POA: Diagnosis not present

## 2023-01-04 DIAGNOSIS — I129 Hypertensive chronic kidney disease with stage 1 through stage 4 chronic kidney disease, or unspecified chronic kidney disease: Secondary | ICD-10-CM | POA: Diagnosis not present

## 2023-01-05 DIAGNOSIS — E785 Hyperlipidemia, unspecified: Secondary | ICD-10-CM | POA: Diagnosis not present

## 2023-01-05 DIAGNOSIS — E1122 Type 2 diabetes mellitus with diabetic chronic kidney disease: Secondary | ICD-10-CM | POA: Diagnosis not present

## 2023-01-05 DIAGNOSIS — I25810 Atherosclerosis of coronary artery bypass graft(s) without angina pectoris: Secondary | ICD-10-CM | POA: Diagnosis not present

## 2023-01-05 DIAGNOSIS — I129 Hypertensive chronic kidney disease with stage 1 through stage 4 chronic kidney disease, or unspecified chronic kidney disease: Secondary | ICD-10-CM | POA: Diagnosis not present

## 2023-01-05 DIAGNOSIS — G20A1 Parkinson's disease without dyskinesia, without mention of fluctuations: Secondary | ICD-10-CM | POA: Diagnosis not present

## 2023-01-05 DIAGNOSIS — R627 Adult failure to thrive: Secondary | ICD-10-CM | POA: Diagnosis not present

## 2023-01-06 DIAGNOSIS — E559 Vitamin D deficiency, unspecified: Secondary | ICD-10-CM | POA: Diagnosis not present

## 2023-01-06 DIAGNOSIS — G20A1 Parkinson's disease without dyskinesia, without mention of fluctuations: Secondary | ICD-10-CM | POA: Diagnosis not present

## 2023-01-06 DIAGNOSIS — R627 Adult failure to thrive: Secondary | ICD-10-CM | POA: Diagnosis not present

## 2023-01-06 DIAGNOSIS — I25810 Atherosclerosis of coronary artery bypass graft(s) without angina pectoris: Secondary | ICD-10-CM | POA: Diagnosis not present

## 2023-01-06 DIAGNOSIS — N138 Other obstructive and reflux uropathy: Secondary | ICD-10-CM | POA: Diagnosis not present

## 2023-01-06 DIAGNOSIS — L570 Actinic keratosis: Secondary | ICD-10-CM | POA: Diagnosis not present

## 2023-01-06 DIAGNOSIS — E785 Hyperlipidemia, unspecified: Secondary | ICD-10-CM | POA: Diagnosis not present

## 2023-01-06 DIAGNOSIS — I129 Hypertensive chronic kidney disease with stage 1 through stage 4 chronic kidney disease, or unspecified chronic kidney disease: Secondary | ICD-10-CM | POA: Diagnosis not present

## 2023-01-06 DIAGNOSIS — I480 Paroxysmal atrial fibrillation: Secondary | ICD-10-CM | POA: Diagnosis not present

## 2023-01-06 DIAGNOSIS — L821 Other seborrheic keratosis: Secondary | ICD-10-CM | POA: Diagnosis not present

## 2023-01-06 DIAGNOSIS — Z8546 Personal history of malignant neoplasm of prostate: Secondary | ICD-10-CM | POA: Diagnosis not present

## 2023-01-06 DIAGNOSIS — N401 Enlarged prostate with lower urinary tract symptoms: Secondary | ICD-10-CM | POA: Diagnosis not present

## 2023-01-06 DIAGNOSIS — H9192 Unspecified hearing loss, left ear: Secondary | ICD-10-CM | POA: Diagnosis not present

## 2023-01-06 DIAGNOSIS — Z515 Encounter for palliative care: Secondary | ICD-10-CM | POA: Diagnosis not present

## 2023-01-06 DIAGNOSIS — E1122 Type 2 diabetes mellitus with diabetic chronic kidney disease: Secondary | ICD-10-CM | POA: Diagnosis not present

## 2023-01-06 DIAGNOSIS — N1832 Chronic kidney disease, stage 3b: Secondary | ICD-10-CM | POA: Diagnosis not present

## 2023-01-06 DIAGNOSIS — Z85828 Personal history of other malignant neoplasm of skin: Secondary | ICD-10-CM | POA: Diagnosis not present

## 2023-01-06 DIAGNOSIS — E1142 Type 2 diabetes mellitus with diabetic polyneuropathy: Secondary | ICD-10-CM | POA: Diagnosis not present

## 2023-01-07 DIAGNOSIS — G20A1 Parkinson's disease without dyskinesia, without mention of fluctuations: Secondary | ICD-10-CM | POA: Diagnosis not present

## 2023-01-07 DIAGNOSIS — I25810 Atherosclerosis of coronary artery bypass graft(s) without angina pectoris: Secondary | ICD-10-CM | POA: Diagnosis not present

## 2023-01-07 DIAGNOSIS — E1122 Type 2 diabetes mellitus with diabetic chronic kidney disease: Secondary | ICD-10-CM | POA: Diagnosis not present

## 2023-01-07 DIAGNOSIS — E785 Hyperlipidemia, unspecified: Secondary | ICD-10-CM | POA: Diagnosis not present

## 2023-01-07 DIAGNOSIS — R627 Adult failure to thrive: Secondary | ICD-10-CM | POA: Diagnosis not present

## 2023-01-07 DIAGNOSIS — I129 Hypertensive chronic kidney disease with stage 1 through stage 4 chronic kidney disease, or unspecified chronic kidney disease: Secondary | ICD-10-CM | POA: Diagnosis not present

## 2023-01-08 DIAGNOSIS — E785 Hyperlipidemia, unspecified: Secondary | ICD-10-CM | POA: Diagnosis not present

## 2023-01-08 DIAGNOSIS — I129 Hypertensive chronic kidney disease with stage 1 through stage 4 chronic kidney disease, or unspecified chronic kidney disease: Secondary | ICD-10-CM | POA: Diagnosis not present

## 2023-01-08 DIAGNOSIS — R627 Adult failure to thrive: Secondary | ICD-10-CM | POA: Diagnosis not present

## 2023-01-08 DIAGNOSIS — I25810 Atherosclerosis of coronary artery bypass graft(s) without angina pectoris: Secondary | ICD-10-CM | POA: Diagnosis not present

## 2023-01-08 DIAGNOSIS — E1122 Type 2 diabetes mellitus with diabetic chronic kidney disease: Secondary | ICD-10-CM | POA: Diagnosis not present

## 2023-01-08 DIAGNOSIS — G20A1 Parkinson's disease without dyskinesia, without mention of fluctuations: Secondary | ICD-10-CM | POA: Diagnosis not present

## 2023-01-09 DIAGNOSIS — I129 Hypertensive chronic kidney disease with stage 1 through stage 4 chronic kidney disease, or unspecified chronic kidney disease: Secondary | ICD-10-CM | POA: Diagnosis not present

## 2023-01-09 DIAGNOSIS — R627 Adult failure to thrive: Secondary | ICD-10-CM | POA: Diagnosis not present

## 2023-01-09 DIAGNOSIS — I25810 Atherosclerosis of coronary artery bypass graft(s) without angina pectoris: Secondary | ICD-10-CM | POA: Diagnosis not present

## 2023-01-09 DIAGNOSIS — E785 Hyperlipidemia, unspecified: Secondary | ICD-10-CM | POA: Diagnosis not present

## 2023-01-09 DIAGNOSIS — G20A1 Parkinson's disease without dyskinesia, without mention of fluctuations: Secondary | ICD-10-CM | POA: Diagnosis not present

## 2023-01-09 DIAGNOSIS — E1122 Type 2 diabetes mellitus with diabetic chronic kidney disease: Secondary | ICD-10-CM | POA: Diagnosis not present

## 2023-01-10 DIAGNOSIS — I129 Hypertensive chronic kidney disease with stage 1 through stage 4 chronic kidney disease, or unspecified chronic kidney disease: Secondary | ICD-10-CM | POA: Diagnosis not present

## 2023-01-10 DIAGNOSIS — G20A1 Parkinson's disease without dyskinesia, without mention of fluctuations: Secondary | ICD-10-CM | POA: Diagnosis not present

## 2023-01-10 DIAGNOSIS — I25810 Atherosclerosis of coronary artery bypass graft(s) without angina pectoris: Secondary | ICD-10-CM | POA: Diagnosis not present

## 2023-01-10 DIAGNOSIS — R627 Adult failure to thrive: Secondary | ICD-10-CM | POA: Diagnosis not present

## 2023-01-10 DIAGNOSIS — E1122 Type 2 diabetes mellitus with diabetic chronic kidney disease: Secondary | ICD-10-CM | POA: Diagnosis not present

## 2023-01-10 DIAGNOSIS — E785 Hyperlipidemia, unspecified: Secondary | ICD-10-CM | POA: Diagnosis not present

## 2023-01-11 ENCOUNTER — Ambulatory Visit: Payer: Medicare Other | Admitting: Podiatry

## 2023-02-06 DEATH — deceased

## 2023-03-29 ENCOUNTER — Ambulatory Visit: Payer: Medicare Other | Admitting: Internal Medicine

## 2023-04-01 ENCOUNTER — Ambulatory Visit: Payer: Medicare Other | Admitting: Neurology

## 2023-05-24 IMAGING — DX DG CHEST 1V PORT
1 series · 1 of 1 positions shown · non-contrast
Comparison: January 23, 2013.

CLINICAL DATA: Chest pain.

EXAM:
PORTABLE CHEST 1 VIEW

[chest]
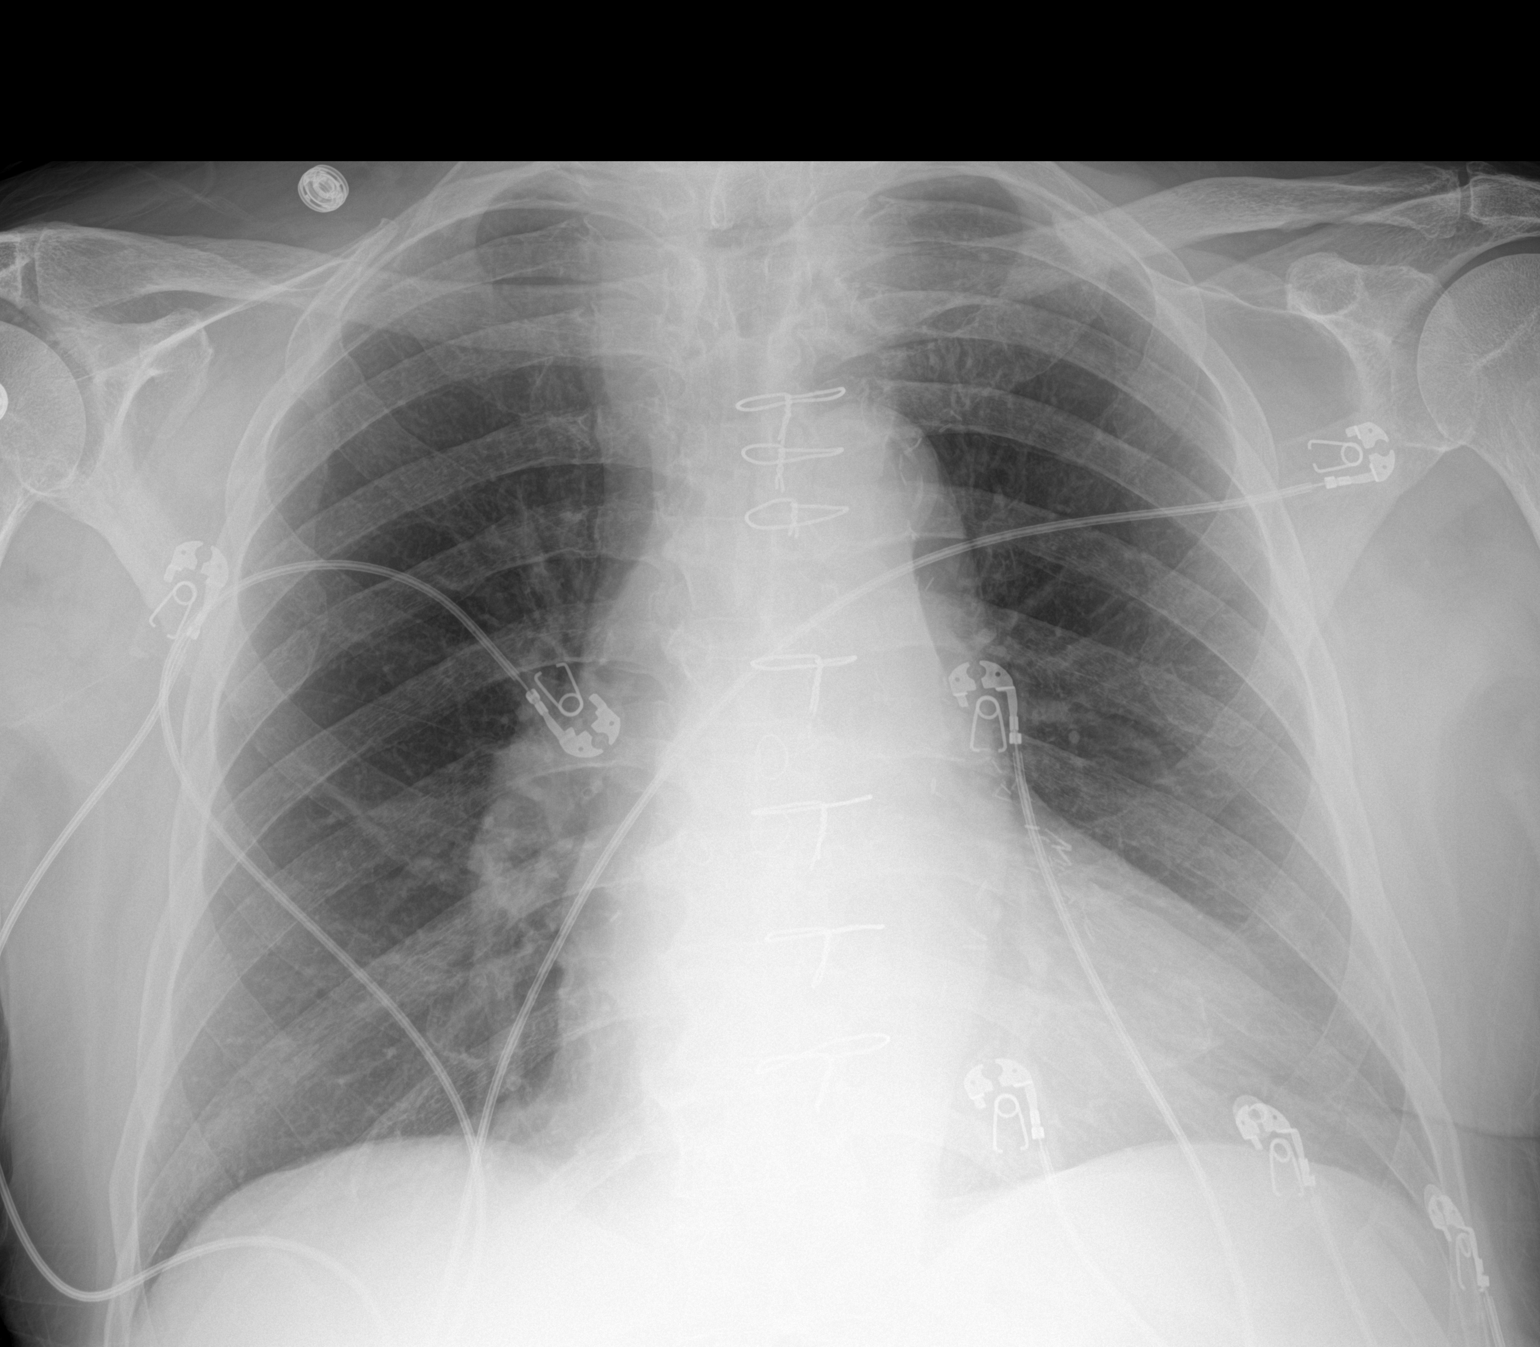

[1 of 1 positions shown; findings below may reference images not displayed]

FINDINGS: Stable cardiomediastinal silhouette. Status post coronary bypass
graft. Both lungs are clear. The visualized skeletal structures are
unremarkable.
IMPRESSION: No active disease.
